# Patient Record
Sex: Female | Born: 1935 | Race: White | Hispanic: No | Smoking: Former smoker
Health system: Southern US, Community
[De-identification: ages and names within clinical notes are randomized; demographics above are authoritative.]

## PROBLEM LIST (undated history)

## (undated) DIAGNOSIS — I10 Essential (primary) hypertension: Secondary | ICD-10-CM

## (undated) DIAGNOSIS — C4490 Unspecified malignant neoplasm of skin, unspecified: Secondary | ICD-10-CM

## (undated) DIAGNOSIS — J449 Chronic obstructive pulmonary disease, unspecified: Secondary | ICD-10-CM

## (undated) DIAGNOSIS — E042 Nontoxic multinodular goiter: Secondary | ICD-10-CM

## (undated) DIAGNOSIS — I4891 Unspecified atrial fibrillation: Secondary | ICD-10-CM

## (undated) DIAGNOSIS — M4850XA Collapsed vertebra, not elsewhere classified, site unspecified, initial encounter for fracture: Secondary | ICD-10-CM

## (undated) DIAGNOSIS — Z860100 Personal history of colon polyps, unspecified: Secondary | ICD-10-CM

## (undated) DIAGNOSIS — K31819 Angiodysplasia of stomach and duodenum without bleeding: Secondary | ICD-10-CM

## (undated) DIAGNOSIS — I2510 Atherosclerotic heart disease of native coronary artery without angina pectoris: Secondary | ICD-10-CM

## (undated) DIAGNOSIS — A4902 Methicillin resistant Staphylococcus aureus infection, unspecified site: Secondary | ICD-10-CM

## (undated) DIAGNOSIS — E7800 Pure hypercholesterolemia, unspecified: Secondary | ICD-10-CM

## (undated) DIAGNOSIS — Z8719 Personal history of other diseases of the digestive system: Secondary | ICD-10-CM

## (undated) DIAGNOSIS — K8020 Calculus of gallbladder without cholecystitis without obstruction: Secondary | ICD-10-CM

## (undated) DIAGNOSIS — R252 Cramp and spasm: Secondary | ICD-10-CM

## (undated) DIAGNOSIS — K625 Hemorrhage of anus and rectum: Secondary | ICD-10-CM

## (undated) DIAGNOSIS — C189 Malignant neoplasm of colon, unspecified: Secondary | ICD-10-CM

## (undated) DIAGNOSIS — I739 Peripheral vascular disease, unspecified: Secondary | ICD-10-CM

## (undated) DIAGNOSIS — K5730 Diverticulosis of large intestine without perforation or abscess without bleeding: Secondary | ICD-10-CM

## (undated) DIAGNOSIS — N912 Amenorrhea, unspecified: Secondary | ICD-10-CM

## (undated) DIAGNOSIS — K56609 Unspecified intestinal obstruction, unspecified as to partial versus complete obstruction: Secondary | ICD-10-CM

## (undated) DIAGNOSIS — S72009A Fracture of unspecified part of neck of unspecified femur, initial encounter for closed fracture: Secondary | ICD-10-CM

## (undated) DIAGNOSIS — E049 Nontoxic goiter, unspecified: Secondary | ICD-10-CM

## (undated) HISTORY — DX: Chronic obstructive pulmonary disease, unspecified: J44.9

## (undated) HISTORY — DX: Nontoxic goiter, unspecified: E04.9

## (undated) HISTORY — DX: Cramp and spasm: R25.2

## (undated) HISTORY — DX: Personal history of colon polyps, unspecified: Z86.0100

## (undated) HISTORY — DX: Unspecified atrial fibrillation: I48.91

## (undated) HISTORY — PX: CARDIAC CATHETERIZATION: SHX172

## (undated) HISTORY — DX: Unspecified intestinal obstruction, unspecified as to partial versus complete obstruction: K56.609

## (undated) HISTORY — PX: TOTAL ABDOMINAL HYSTERECTOMY: SHX209

## (undated) HISTORY — DX: Calculus of gallbladder without cholecystitis without obstruction: K80.20

## (undated) HISTORY — DX: Personal history of other diseases of the digestive system: Z87.19

## (undated) HISTORY — DX: Nontoxic multinodular goiter: E04.2

## (undated) HISTORY — DX: Pure hypercholesterolemia, unspecified: E78.00

## (undated) HISTORY — DX: Methicillin resistant Staphylococcus aureus infection, unspecified site: A49.02

## (undated) HISTORY — PX: ABDOMINAL SURGERY: SHX537

## (undated) HISTORY — DX: Hemorrhage of anus and rectum: K62.5

## (undated) HISTORY — PX: HIP FRACTURE SURGERY: SHX118

## (undated) HISTORY — DX: Amenorrhea, unspecified: N91.2

## (undated) HISTORY — DX: Collapsed vertebra, not elsewhere classified, site unspecified, initial encounter for fracture: M48.50XA

## (undated) HISTORY — DX: Angiodysplasia of stomach and duodenum without bleeding: K31.819

## (undated) HISTORY — DX: Diverticulosis of large intestine without perforation or abscess without bleeding: K57.30

## (undated) HISTORY — DX: Malignant neoplasm of colon, unspecified: C18.9

## (undated) HISTORY — PX: ABDOMINAL HYSTERECTOMY: SHX81

## (undated) HISTORY — DX: Personal history of colonic polyps: Z86.010

## (undated) HISTORY — DX: Unspecified malignant neoplasm of skin, unspecified: C44.90

## (undated) HISTORY — DX: Fracture of unspecified part of neck of unspecified femur, initial encounter for closed fracture: S72.009A

## (undated) HISTORY — DX: Peripheral vascular disease, unspecified: I73.9

---

## 1996-08-27 ENCOUNTER — Encounter (INDEPENDENT_AMBULATORY_CARE_PROVIDER_SITE_OTHER): Payer: Self-pay | Admitting: Internal Medicine

## 1997-05-29 HISTORY — PX: COLON RESECTION: SHX5231

## 1997-05-29 HISTORY — PX: ILEOSTOMY: SHX1783

## 1997-09-02 ENCOUNTER — Ambulatory Visit (HOSPITAL_COMMUNITY): Admission: RE | Admit: 1997-09-02 | Discharge: 1997-09-02 | Payer: Self-pay

## 1997-10-27 DIAGNOSIS — K8020 Calculus of gallbladder without cholecystitis without obstruction: Secondary | ICD-10-CM

## 1997-10-27 HISTORY — DX: Calculus of gallbladder without cholecystitis without obstruction: K80.20

## 1997-12-10 ENCOUNTER — Inpatient Hospital Stay (HOSPITAL_COMMUNITY): Admission: RE | Admit: 1997-12-10 | Discharge: 1997-12-22 | Payer: Self-pay

## 1998-03-25 ENCOUNTER — Ambulatory Visit (HOSPITAL_COMMUNITY): Admission: RE | Admit: 1998-03-25 | Discharge: 1998-03-25 | Payer: Self-pay | Admitting: Gastroenterology

## 1998-11-27 HISTORY — PX: ANGIOPLASTY: SHX39

## 1999-03-23 ENCOUNTER — Observation Stay: Admission: RE | Admit: 1999-03-23 | Discharge: 1999-03-24 | Payer: Self-pay | Admitting: *Deleted

## 1999-03-30 LAB — FECAL OCCULT BLOOD, GUAIAC: Fecal Occult Blood: NEGATIVE

## 1999-06-29 ENCOUNTER — Encounter: Admission: RE | Admit: 1999-06-29 | Discharge: 1999-06-29 | Payer: Self-pay

## 2000-01-11 ENCOUNTER — Encounter: Payer: Self-pay | Admitting: Vascular Surgery

## 2000-01-12 ENCOUNTER — Observation Stay (HOSPITAL_COMMUNITY): Admission: RE | Admit: 2000-01-12 | Discharge: 2000-01-13 | Payer: Self-pay | Admitting: Vascular Surgery

## 2000-06-25 ENCOUNTER — Encounter: Admission: RE | Admit: 2000-06-25 | Discharge: 2000-06-25 | Payer: Self-pay

## 2000-06-28 ENCOUNTER — Inpatient Hospital Stay (HOSPITAL_COMMUNITY): Admission: EM | Admit: 2000-06-28 | Discharge: 2000-07-04 | Payer: Self-pay | Admitting: Emergency Medicine

## 2000-06-29 ENCOUNTER — Encounter: Payer: Self-pay | Admitting: General Surgery

## 2000-07-03 ENCOUNTER — Encounter: Payer: Self-pay | Admitting: General Surgery

## 2000-07-04 ENCOUNTER — Encounter (HOSPITAL_COMMUNITY): Admission: RE | Admit: 2000-07-04 | Discharge: 2000-10-02 | Payer: Self-pay | Admitting: General Surgery

## 2001-01-19 ENCOUNTER — Inpatient Hospital Stay (HOSPITAL_COMMUNITY): Admission: EM | Admit: 2001-01-19 | Discharge: 2001-01-21 | Payer: Self-pay | Admitting: Internal Medicine

## 2001-01-19 ENCOUNTER — Encounter: Payer: Self-pay | Admitting: Internal Medicine

## 2002-05-29 HISTORY — PX: CATARACT EXTRACTION: SUR2

## 2002-07-13 ENCOUNTER — Encounter: Payer: Self-pay | Admitting: Emergency Medicine

## 2002-07-13 ENCOUNTER — Emergency Department (HOSPITAL_COMMUNITY): Admission: EM | Admit: 2002-07-13 | Discharge: 2002-07-13 | Payer: Self-pay | Admitting: Emergency Medicine

## 2002-10-19 ENCOUNTER — Inpatient Hospital Stay (HOSPITAL_COMMUNITY): Admission: EM | Admit: 2002-10-19 | Discharge: 2002-10-21 | Payer: Self-pay | Admitting: *Deleted

## 2003-05-17 ENCOUNTER — Inpatient Hospital Stay (HOSPITAL_COMMUNITY): Admission: EM | Admit: 2003-05-17 | Discharge: 2003-05-22 | Payer: Self-pay | Admitting: *Deleted

## 2004-04-07 ENCOUNTER — Ambulatory Visit: Payer: Self-pay | Admitting: Family Medicine

## 2004-05-31 ENCOUNTER — Ambulatory Visit: Payer: Self-pay | Admitting: Family Medicine

## 2004-06-12 ENCOUNTER — Emergency Department (HOSPITAL_COMMUNITY): Admission: EM | Admit: 2004-06-12 | Discharge: 2004-06-12 | Payer: Self-pay | Admitting: Emergency Medicine

## 2004-06-17 ENCOUNTER — Ambulatory Visit: Payer: Self-pay | Admitting: Family Medicine

## 2004-08-02 ENCOUNTER — Ambulatory Visit: Payer: Self-pay | Admitting: Family Medicine

## 2004-08-12 ENCOUNTER — Ambulatory Visit: Payer: Self-pay | Admitting: Family Medicine

## 2004-08-15 ENCOUNTER — Ambulatory Visit: Payer: Self-pay | Admitting: Family Medicine

## 2004-08-16 ENCOUNTER — Ambulatory Visit: Payer: Self-pay

## 2004-09-01 ENCOUNTER — Ambulatory Visit: Payer: Self-pay | Admitting: Family Medicine

## 2004-10-10 ENCOUNTER — Ambulatory Visit: Payer: Self-pay | Admitting: Gastroenterology

## 2004-11-03 ENCOUNTER — Ambulatory Visit: Payer: Self-pay | Admitting: Gastroenterology

## 2005-01-31 ENCOUNTER — Ambulatory Visit: Payer: Self-pay | Admitting: Family Medicine

## 2005-02-03 ENCOUNTER — Ambulatory Visit: Payer: Self-pay | Admitting: Family Medicine

## 2005-02-09 ENCOUNTER — Ambulatory Visit: Payer: Self-pay | Admitting: Family Medicine

## 2005-02-10 ENCOUNTER — Ambulatory Visit: Payer: Self-pay

## 2005-02-22 ENCOUNTER — Ambulatory Visit: Payer: Self-pay | Admitting: Family Medicine

## 2005-03-08 ENCOUNTER — Ambulatory Visit: Payer: Self-pay | Admitting: Family Medicine

## 2005-03-20 ENCOUNTER — Ambulatory Visit: Payer: Self-pay | Admitting: Family Medicine

## 2005-04-13 ENCOUNTER — Ambulatory Visit: Payer: Self-pay | Admitting: Internal Medicine

## 2005-04-25 ENCOUNTER — Ambulatory Visit: Payer: Self-pay | Admitting: Family Medicine

## 2005-05-09 ENCOUNTER — Ambulatory Visit: Payer: Self-pay | Admitting: Internal Medicine

## 2005-05-30 ENCOUNTER — Ambulatory Visit: Payer: Self-pay | Admitting: Family Medicine

## 2005-07-27 ENCOUNTER — Ambulatory Visit: Payer: Self-pay

## 2005-08-07 ENCOUNTER — Ambulatory Visit: Payer: Self-pay | Admitting: Family Medicine

## 2005-08-10 ENCOUNTER — Ambulatory Visit: Payer: Self-pay | Admitting: Family Medicine

## 2005-08-21 ENCOUNTER — Ambulatory Visit: Payer: Self-pay | Admitting: Family Medicine

## 2005-09-06 ENCOUNTER — Emergency Department (HOSPITAL_COMMUNITY): Admission: EM | Admit: 2005-09-06 | Discharge: 2005-09-07 | Payer: Self-pay | Admitting: Emergency Medicine

## 2005-09-06 ENCOUNTER — Ambulatory Visit: Payer: Self-pay | Admitting: Family Medicine

## 2005-09-11 ENCOUNTER — Ambulatory Visit: Payer: Self-pay | Admitting: Internal Medicine

## 2006-02-12 ENCOUNTER — Ambulatory Visit: Payer: Self-pay | Admitting: Internal Medicine

## 2006-03-14 ENCOUNTER — Ambulatory Visit: Payer: Self-pay | Admitting: Family Medicine

## 2006-04-27 ENCOUNTER — Ambulatory Visit: Payer: Self-pay | Admitting: Family Medicine

## 2006-05-29 DIAGNOSIS — J449 Chronic obstructive pulmonary disease, unspecified: Secondary | ICD-10-CM

## 2006-05-29 HISTORY — DX: Chronic obstructive pulmonary disease, unspecified: J44.9

## 2006-06-12 ENCOUNTER — Ambulatory Visit: Payer: Self-pay | Admitting: Family Medicine

## 2006-06-19 ENCOUNTER — Ambulatory Visit: Payer: Self-pay | Admitting: Family Medicine

## 2006-07-13 ENCOUNTER — Ambulatory Visit: Payer: Self-pay | Admitting: Vascular Surgery

## 2006-08-03 ENCOUNTER — Observation Stay (HOSPITAL_COMMUNITY): Admission: RE | Admit: 2006-08-03 | Discharge: 2006-08-04 | Payer: Self-pay | Admitting: Orthopedic Surgery

## 2006-08-29 ENCOUNTER — Ambulatory Visit: Payer: Self-pay | Admitting: Gastroenterology

## 2006-08-29 LAB — CONVERTED CEMR LAB
Basophils Relative: 0.3 % (ref 0.0–1.0)
Eosinophils Relative: 2.2 % (ref 0.0–5.0)
Folate: >20 ng/mL
HCT: 36.6 % (ref 36.0–46.0)
Iron: 84 ug/dL (ref 42–145)
Neutrophils Relative %: 70.3 % (ref 43.0–77.0)
Platelets: 137 10*3/uL — ABNORMAL LOW (ref 150–400)
RBC: 3.61 M/uL — ABNORMAL LOW (ref 3.87–5.11)
RDW: 12.7 % (ref 11.5–14.6)
Saturation Ratios: 29.1 % (ref 20.0–50.0)
Transferrin: 206.3 mg/dL — ABNORMAL LOW (ref >=212.0)
WBC: 7.1 10*3/uL (ref 4.5–10.5)

## 2006-09-11 ENCOUNTER — Ambulatory Visit: Payer: Self-pay | Admitting: Gastroenterology

## 2006-09-11 LAB — CONVERTED CEMR LAB
OCCULT 1: POSITIVE — AB
OCCULT 2: POSITIVE — AB
OCCULT 5: NEGATIVE

## 2006-11-01 ENCOUNTER — Ambulatory Visit: Payer: Self-pay | Admitting: Internal Medicine

## 2006-11-07 ENCOUNTER — Ambulatory Visit: Payer: Self-pay

## 2006-11-07 ENCOUNTER — Ambulatory Visit: Payer: Self-pay | Admitting: Internal Medicine

## 2006-11-15 ENCOUNTER — Ambulatory Visit: Payer: Self-pay | Admitting: Internal Medicine

## 2006-12-08 ENCOUNTER — Emergency Department (HOSPITAL_COMMUNITY): Admission: EM | Admit: 2006-12-08 | Discharge: 2006-12-08 | Payer: Self-pay | Admitting: Family Medicine

## 2006-12-12 ENCOUNTER — Ambulatory Visit: Payer: Self-pay | Admitting: Family Medicine

## 2006-12-12 DIAGNOSIS — J449 Chronic obstructive pulmonary disease, unspecified: Secondary | ICD-10-CM

## 2006-12-12 DIAGNOSIS — J4489 Other specified chronic obstructive pulmonary disease: Secondary | ICD-10-CM | POA: Insufficient documentation

## 2006-12-17 ENCOUNTER — Ambulatory Visit: Payer: Self-pay | Admitting: Family Medicine

## 2006-12-18 ENCOUNTER — Encounter (INDEPENDENT_AMBULATORY_CARE_PROVIDER_SITE_OTHER): Payer: Self-pay | Admitting: Internal Medicine

## 2006-12-18 DIAGNOSIS — Z860100 Personal history of colon polyps, unspecified: Secondary | ICD-10-CM | POA: Insufficient documentation

## 2006-12-18 DIAGNOSIS — R000 Tachycardia, unspecified: Secondary | ICD-10-CM

## 2006-12-18 DIAGNOSIS — K2960 Other gastritis without bleeding: Secondary | ICD-10-CM

## 2006-12-18 DIAGNOSIS — I7090 Unspecified atherosclerosis: Secondary | ICD-10-CM | POA: Insufficient documentation

## 2006-12-18 DIAGNOSIS — I739 Peripheral vascular disease, unspecified: Secondary | ICD-10-CM | POA: Insufficient documentation

## 2006-12-18 DIAGNOSIS — I498 Other specified cardiac arrhythmias: Secondary | ICD-10-CM

## 2006-12-18 DIAGNOSIS — Z87891 Personal history of nicotine dependence: Secondary | ICD-10-CM | POA: Insufficient documentation

## 2006-12-21 ENCOUNTER — Ambulatory Visit: Payer: Self-pay | Admitting: Family Medicine

## 2006-12-25 ENCOUNTER — Telehealth (INDEPENDENT_AMBULATORY_CARE_PROVIDER_SITE_OTHER): Payer: Self-pay | Admitting: Internal Medicine

## 2007-01-04 ENCOUNTER — Ambulatory Visit: Payer: Self-pay | Admitting: Vascular Surgery

## 2007-01-14 ENCOUNTER — Ambulatory Visit: Payer: Self-pay | Admitting: Internal Medicine

## 2007-01-14 LAB — CONVERTED CEMR LAB
ALT: 41 units/L — ABNORMAL HIGH (ref 0–35)
Bilirubin, Direct: 0.2 mg/dL (ref 0.0–0.3)
Cholesterol: 157 mg/dL (ref 0–200)
HDL: 43.5 mg/dL (ref >39.0)
LDL Cholesterol: 93 mg/dL (ref 0–99)
Total CHOL/HDL Ratio: 3.6
Total Protein: 6.2 g/dL (ref 6.0–8.3)
Triglycerides: 102 mg/dL (ref 0–149)

## 2007-02-21 ENCOUNTER — Ambulatory Visit: Payer: Self-pay | Admitting: Internal Medicine

## 2007-02-21 DIAGNOSIS — J069 Acute upper respiratory infection, unspecified: Secondary | ICD-10-CM | POA: Insufficient documentation

## 2007-03-04 ENCOUNTER — Ambulatory Visit: Payer: Self-pay | Admitting: Internal Medicine

## 2007-03-15 ENCOUNTER — Ambulatory Visit: Payer: Self-pay | Admitting: Family Medicine

## 2007-03-25 ENCOUNTER — Ambulatory Visit: Payer: Self-pay | Admitting: Internal Medicine

## 2007-03-25 LAB — CONVERTED CEMR LAB
BUN: 14 mg/dL (ref 6–23)
Basophils Relative: 0.7 % (ref 0.0–1.0)
CO2: 34 meq/L — ABNORMAL HIGH (ref 19–32)
Calcium: 9.5 mg/dL (ref 8.4–10.5)
Creatinine, Ser: 0.9 mg/dL (ref 0.4–1.2)
Eosinophils Relative: 3.6 % (ref 0.0–5.0)
Hemoglobin: 14.1 g/dL (ref 12.0–15.0)
Monocytes Absolute: 0.7 10*3/uL (ref 0.2–0.7)
Monocytes Relative: 12.5 % — ABNORMAL HIGH (ref 3.0–11.0)
Potassium: 4.3 meq/L (ref 3.5–5.1)
RDW: 12.7 % (ref 11.5–14.6)
TSH: 1.17 microintl units/mL (ref 0.35–5.50)
WBC: 5.4 10*3/uL (ref 4.5–10.5)

## 2007-04-23 ENCOUNTER — Ambulatory Visit: Payer: Self-pay | Admitting: Internal Medicine

## 2007-04-24 ENCOUNTER — Encounter: Payer: Self-pay | Admitting: Family Medicine

## 2007-04-30 ENCOUNTER — Ambulatory Visit: Payer: Self-pay | Admitting: Family Medicine

## 2007-05-03 ENCOUNTER — Telehealth (INDEPENDENT_AMBULATORY_CARE_PROVIDER_SITE_OTHER): Payer: Self-pay | Admitting: *Deleted

## 2007-05-20 ENCOUNTER — Ambulatory Visit: Payer: Self-pay | Admitting: Internal Medicine

## 2007-05-27 ENCOUNTER — Ambulatory Visit: Payer: Self-pay | Admitting: Family Medicine

## 2007-06-18 ENCOUNTER — Encounter: Payer: Self-pay | Admitting: Family Medicine

## 2007-06-24 ENCOUNTER — Ambulatory Visit: Payer: Self-pay | Admitting: Internal Medicine

## 2007-07-09 ENCOUNTER — Ambulatory Visit: Payer: Self-pay | Admitting: Internal Medicine

## 2007-07-12 ENCOUNTER — Encounter: Payer: Self-pay | Admitting: Internal Medicine

## 2007-07-16 ENCOUNTER — Ambulatory Visit: Payer: Self-pay | Admitting: Internal Medicine

## 2007-07-16 LAB — CONVERTED CEMR LAB
Cholesterol: 192 mg/dL (ref 0–200)
Total CHOL/HDL Ratio: 3.4
Triglycerides: 99 mg/dL (ref 0–149)

## 2007-08-08 ENCOUNTER — Ambulatory Visit: Payer: Self-pay | Admitting: Internal Medicine

## 2007-09-09 ENCOUNTER — Ambulatory Visit: Payer: Self-pay | Admitting: Internal Medicine

## 2007-09-16 ENCOUNTER — Ambulatory Visit: Payer: Self-pay | Admitting: Internal Medicine

## 2007-11-05 ENCOUNTER — Encounter: Payer: Self-pay | Admitting: Family Medicine

## 2007-11-13 ENCOUNTER — Ambulatory Visit: Payer: Self-pay

## 2007-11-13 ENCOUNTER — Ambulatory Visit: Payer: Self-pay | Admitting: Family Medicine

## 2007-11-13 LAB — CONVERTED CEMR LAB
Albumin: 3.8 g/dL (ref 3.5–5.2)
Cholesterol: 208 mg/dL (ref 0–200)
Direct LDL: 128.4 mg/dL
HDL: 62.9 mg/dL (ref >39.0)
Total CHOL/HDL Ratio: 3.3
Triglycerides: 82 mg/dL (ref 0–149)
VLDL: 16 mg/dL (ref 0–40)

## 2007-11-16 ENCOUNTER — Emergency Department (HOSPITAL_COMMUNITY): Admission: EM | Admit: 2007-11-16 | Discharge: 2007-11-16 | Payer: Self-pay | Admitting: Emergency Medicine

## 2007-11-25 ENCOUNTER — Encounter: Payer: Self-pay | Admitting: Family Medicine

## 2007-12-10 ENCOUNTER — Ambulatory Visit: Payer: Self-pay | Admitting: Family Medicine

## 2007-12-11 ENCOUNTER — Encounter: Payer: Self-pay | Admitting: Family Medicine

## 2007-12-11 LAB — CONVERTED CEMR LAB
Chloride: 102 meq/L (ref 96–112)
Creatinine, Ser: 1.1 mg/dL (ref 0.4–1.2)
Free T4: 0.9 ng/dL (ref 0.6–1.6)
GFR calc non Af Amer: 50 mL/min
Potassium: 4.1 meq/L (ref 3.5–5.1)

## 2007-12-12 ENCOUNTER — Encounter: Payer: Self-pay | Admitting: Family Medicine

## 2008-02-20 ENCOUNTER — Inpatient Hospital Stay (HOSPITAL_COMMUNITY): Admission: RE | Admit: 2008-02-20 | Discharge: 2008-02-21 | Payer: Self-pay | Admitting: Orthopaedic Surgery

## 2008-02-24 ENCOUNTER — Encounter: Payer: Self-pay | Admitting: Family Medicine

## 2008-02-25 ENCOUNTER — Ambulatory Visit: Payer: Self-pay | Admitting: Family Medicine

## 2008-03-04 ENCOUNTER — Ambulatory Visit: Payer: Self-pay | Admitting: Family Medicine

## 2008-03-05 ENCOUNTER — Telehealth: Payer: Self-pay | Admitting: Family Medicine

## 2008-03-23 ENCOUNTER — Encounter: Payer: Self-pay | Admitting: Family Medicine

## 2008-03-30 ENCOUNTER — Ambulatory Visit: Payer: Self-pay | Admitting: Internal Medicine

## 2008-04-10 ENCOUNTER — Encounter: Payer: Self-pay | Admitting: Family Medicine

## 2008-04-14 ENCOUNTER — Ambulatory Visit: Payer: Self-pay

## 2008-04-20 ENCOUNTER — Encounter: Admission: RE | Admit: 2008-04-20 | Discharge: 2008-04-20 | Payer: Self-pay | Admitting: Orthopaedic Surgery

## 2008-04-27 ENCOUNTER — Encounter: Payer: Self-pay | Admitting: Family Medicine

## 2008-05-11 ENCOUNTER — Ambulatory Visit: Payer: Self-pay | Admitting: Family Medicine

## 2008-05-27 DIAGNOSIS — E785 Hyperlipidemia, unspecified: Secondary | ICD-10-CM

## 2008-05-27 DIAGNOSIS — I4891 Unspecified atrial fibrillation: Secondary | ICD-10-CM

## 2008-05-27 DIAGNOSIS — I2510 Atherosclerotic heart disease of native coronary artery without angina pectoris: Secondary | ICD-10-CM | POA: Insufficient documentation

## 2008-05-27 DIAGNOSIS — I700 Atherosclerosis of aorta: Secondary | ICD-10-CM

## 2008-05-27 DIAGNOSIS — I10 Essential (primary) hypertension: Secondary | ICD-10-CM

## 2008-06-19 ENCOUNTER — Encounter: Payer: Self-pay | Admitting: Family Medicine

## 2008-07-01 ENCOUNTER — Ambulatory Visit: Payer: Self-pay | Admitting: Family Medicine

## 2008-07-06 ENCOUNTER — Telehealth: Payer: Self-pay | Admitting: Family Medicine

## 2008-07-19 ENCOUNTER — Emergency Department (HOSPITAL_COMMUNITY): Admission: EM | Admit: 2008-07-19 | Discharge: 2008-07-19 | Payer: Self-pay | Admitting: Emergency Medicine

## 2008-09-01 ENCOUNTER — Encounter: Payer: Self-pay | Admitting: Family Medicine

## 2008-09-07 ENCOUNTER — Encounter: Payer: Self-pay | Admitting: Family Medicine

## 2008-10-16 ENCOUNTER — Ambulatory Visit: Payer: Self-pay

## 2008-10-16 ENCOUNTER — Encounter: Payer: Self-pay | Admitting: Internal Medicine

## 2008-11-09 ENCOUNTER — Ambulatory Visit: Payer: Self-pay | Admitting: Internal Medicine

## 2008-11-25 ENCOUNTER — Encounter: Payer: Self-pay | Admitting: Family Medicine

## 2008-11-25 ENCOUNTER — Telehealth: Payer: Self-pay | Admitting: Family Medicine

## 2008-12-09 ENCOUNTER — Encounter: Payer: Self-pay | Admitting: Cardiovascular Disease

## 2008-12-15 ENCOUNTER — Ambulatory Visit: Payer: Self-pay | Admitting: Family Medicine

## 2008-12-24 ENCOUNTER — Telehealth: Payer: Self-pay | Admitting: Family Medicine

## 2009-02-24 ENCOUNTER — Emergency Department (HOSPITAL_COMMUNITY): Admission: EM | Admit: 2009-02-24 | Discharge: 2009-02-24 | Payer: Self-pay | Admitting: Emergency Medicine

## 2009-03-03 ENCOUNTER — Ambulatory Visit: Payer: Self-pay | Admitting: Family Medicine

## 2009-03-12 ENCOUNTER — Ambulatory Visit: Payer: Self-pay | Admitting: Family Medicine

## 2009-03-12 LAB — CONVERTED CEMR LAB: Rapid Strep: NEGATIVE

## 2009-06-29 ENCOUNTER — Ambulatory Visit: Payer: Self-pay | Admitting: Family Medicine

## 2009-06-30 LAB — CONVERTED CEMR LAB
ALT: 20 units/L (ref 0–35)
AST: 24 units/L (ref 0–37)
BUN: 15 mg/dL (ref 6–23)
Basophils Absolute: 0 10*3/uL (ref 0.0–0.1)
Basophils Relative: 0.4 % (ref 0.0–3.0)
CO2: 33 meq/L — ABNORMAL HIGH (ref 19–32)
Chloride: 105 meq/L (ref 96–112)
Cholesterol: 201 mg/dL — ABNORMAL HIGH (ref 0–200)
Direct LDL: 111.3 mg/dL
Eosinophils Relative: 1.4 % (ref 0.0–5.0)
GFR calc non Af Amer: 55.7 mL/min (ref >60)
HCT: 41.3 % (ref 36.0–46.0)
Hemoglobin: 13.6 g/dL (ref 12.0–15.0)
Lymphs Abs: 1.4 10*3/uL (ref 0.7–4.0)
Monocytes Relative: 9.1 % (ref 3.0–12.0)
Neutro Abs: 4.7 10*3/uL (ref 1.4–7.7)
RBC: 3.95 M/uL (ref 3.87–5.11)
RDW: 12.4 % (ref 11.5–14.6)
TSH: 0.58 microintl units/mL (ref 0.35–5.50)
Total CHOL/HDL Ratio: 3
Total Protein: 6.4 g/dL (ref 6.0–8.3)

## 2009-07-01 ENCOUNTER — Ambulatory Visit: Payer: Self-pay | Admitting: Family Medicine

## 2009-07-09 ENCOUNTER — Encounter: Payer: Self-pay | Admitting: Family Medicine

## 2009-07-09 ENCOUNTER — Ambulatory Visit: Payer: Self-pay | Admitting: Internal Medicine

## 2009-07-12 ENCOUNTER — Telehealth: Payer: Self-pay | Admitting: Internal Medicine

## 2009-07-14 ENCOUNTER — Encounter: Payer: Self-pay | Admitting: Internal Medicine

## 2009-07-30 ENCOUNTER — Telehealth: Payer: Self-pay | Admitting: Internal Medicine

## 2009-08-11 ENCOUNTER — Ambulatory Visit: Payer: Self-pay | Admitting: Family Medicine

## 2009-08-11 LAB — CONVERTED CEMR LAB
Bilirubin Urine: NEGATIVE
Specific Gravity, Urine: 1.01
Urobilinogen, UA: 0.2
pH: 5

## 2009-08-12 ENCOUNTER — Encounter: Payer: Self-pay | Admitting: Family Medicine

## 2009-08-23 ENCOUNTER — Telehealth: Payer: Self-pay | Admitting: Internal Medicine

## 2009-08-26 ENCOUNTER — Encounter: Payer: Self-pay | Admitting: Internal Medicine

## 2009-10-28 ENCOUNTER — Encounter: Payer: Self-pay | Admitting: Family Medicine

## 2009-10-28 ENCOUNTER — Encounter: Payer: Self-pay | Admitting: Internal Medicine

## 2009-10-30 ENCOUNTER — Emergency Department (HOSPITAL_COMMUNITY): Admission: EM | Admit: 2009-10-30 | Discharge: 2009-10-30 | Payer: Self-pay | Admitting: Family Medicine

## 2009-11-01 ENCOUNTER — Telehealth: Payer: Self-pay | Admitting: Family Medicine

## 2009-11-03 ENCOUNTER — Ambulatory Visit: Payer: Self-pay | Admitting: Family Medicine

## 2009-11-08 ENCOUNTER — Telehealth: Payer: Self-pay | Admitting: Internal Medicine

## 2009-11-10 ENCOUNTER — Encounter: Payer: Self-pay | Admitting: Family Medicine

## 2009-11-12 ENCOUNTER — Encounter (INDEPENDENT_AMBULATORY_CARE_PROVIDER_SITE_OTHER): Payer: Self-pay | Admitting: *Deleted

## 2009-11-15 ENCOUNTER — Ambulatory Visit: Payer: Self-pay | Admitting: Family Medicine

## 2010-01-04 ENCOUNTER — Encounter (INDEPENDENT_AMBULATORY_CARE_PROVIDER_SITE_OTHER): Payer: Self-pay | Admitting: *Deleted

## 2010-01-24 ENCOUNTER — Ambulatory Visit: Payer: Self-pay | Admitting: Internal Medicine

## 2010-01-24 DIAGNOSIS — I6529 Occlusion and stenosis of unspecified carotid artery: Secondary | ICD-10-CM

## 2010-01-28 ENCOUNTER — Encounter: Payer: Self-pay | Admitting: Internal Medicine

## 2010-02-03 ENCOUNTER — Ambulatory Visit: Payer: Self-pay

## 2010-02-03 ENCOUNTER — Encounter: Payer: Self-pay | Admitting: Internal Medicine

## 2010-02-08 ENCOUNTER — Telehealth: Payer: Self-pay | Admitting: Internal Medicine

## 2010-02-14 ENCOUNTER — Telehealth: Payer: Self-pay | Admitting: Internal Medicine

## 2010-02-17 ENCOUNTER — Ambulatory Visit: Payer: Self-pay | Admitting: Family Medicine

## 2010-04-14 ENCOUNTER — Ambulatory Visit: Payer: Self-pay | Admitting: Family Medicine

## 2010-04-20 ENCOUNTER — Telehealth: Payer: Self-pay | Admitting: Family Medicine

## 2010-05-24 ENCOUNTER — Ambulatory Visit
Admission: RE | Admit: 2010-05-24 | Discharge: 2010-05-24 | Payer: Self-pay | Source: Home / Self Care | Attending: Internal Medicine | Admitting: Internal Medicine

## 2010-05-24 ENCOUNTER — Ambulatory Visit
Admission: RE | Admit: 2010-05-24 | Discharge: 2010-05-24 | Payer: Self-pay | Source: Home / Self Care | Attending: Family Medicine | Admitting: Family Medicine

## 2010-05-25 ENCOUNTER — Telehealth: Payer: Self-pay | Admitting: Family Medicine

## 2010-05-28 ENCOUNTER — Observation Stay (HOSPITAL_COMMUNITY)
Admission: EM | Admit: 2010-05-28 | Discharge: 2010-06-02 | Payer: Self-pay | Source: Home / Self Care | Attending: Internal Medicine | Admitting: Internal Medicine

## 2010-06-08 ENCOUNTER — Encounter: Payer: Self-pay | Admitting: Family Medicine

## 2010-06-21 ENCOUNTER — Ambulatory Visit (HOSPITAL_COMMUNITY)
Admission: RE | Admit: 2010-06-21 | Discharge: 2010-06-21 | Payer: Self-pay | Source: Home / Self Care | Attending: Interventional Radiology | Admitting: Interventional Radiology

## 2010-06-22 ENCOUNTER — Ambulatory Visit
Admission: RE | Admit: 2010-06-22 | Discharge: 2010-06-22 | Payer: Self-pay | Source: Home / Self Care | Attending: Family Medicine | Admitting: Family Medicine

## 2010-06-28 NOTE — Progress Notes (Signed)
Summary: Patient's at Home Vitals  Patient's at Home Vitals   Imported By: Marylou Mccoy 07/15/2009 11:49:16  _____________________________________________________________________  External Attachment:    Type:   Image     Comment:   External Document  Appended Document: Patient's at Home Vitals BP is a little up/down.  I would not push further.  Keep on same meds.  Appended Document: Patient's at Home Vitals Patient aware of above.

## 2010-06-28 NOTE — Assessment & Plan Note (Signed)
Summary: ?UTI/CLE   Vital Signs:  Patient profile:   77 year old female Weight:      153 pounds Temp:     97.6 degrees F oral Pulse rate:   76 / minute Pulse rhythm:   regular BP sitting:   138 / 78  (left arm) Cuff size:   regular  Vitals Entered By: Sydell Axon LPN (August 11, 2009 11:25 AM) CC: ? UTI, problem emptying bladder, pressure and back pain   History of Present Illness: Pt here fotr burning with urination and lack of emptying with frequency and feeling of not emptying completely. She denies fever or chills. She typically has chills. She has mild LBP but not much.  Problems Prior to Update: 1)  Hair Loss  (ICD-704.00) 2)  Atrial Fibrillation  (ICD-427.31) 3)  Aortic Atherosclerosis  (ICD-440.0) 4)  Hypertension, Benign  (ICD-401.1) 5)  Hyperlipidemia-mixed  (ICD-272.4) 6)  Cad, Native Vessel  (ICD-414.01) 7)  Low Back Pain Syndrome  (ICD-724.2) 8)  Ankle Edema  (ICD-782.3) 9)  Preoperative Examination  (ICD-V72.84) 10)  Rectal Bleeding  (ICD-569.3) 11)  Hypertension  (ICD-401.9) 12)  Uri  (ICD-465.9) 13)  Goiter, Multinodular, Thyroid  (ICD-241.1) 14)  Gi Bleeding, Small Intest With Anemia  (ICD-578.9) 15)  Tachycardia  (ICD-785.0) 16)  Hypercholesterolemia, 239/ldl 159  (ICD-272.0) 17)  Arteriovenous Malformation, Duodenum  (ICD-747.60) 18)  Carcinoma, Colon, S/p Resection  (ICD-153.9) 19)  Tobacco Abuse, Hx of  (ICD-V15.82) 20)  Reflux Gastritis  (ICD-535.40) 21)  Atherosclerosis  (ICD-440.9) 22)  Leg Cramps With Mild Claudication  (ICD-729.82) 23)  Sinus Tachycardia  (ICD-427.89) 24)  Peripheral Vascular Disease  (ICD-443.9) 25)  Diverticulosis, Colon  (ICD-562.10) 26)  Colonic Polyps, Hx of  (ICD-V12.72) 27)  COPD  (ICD-496)  Medications Prior to Update: 1)  Adult Aspirin Low Strength 81 Mg  Tbdp (Aspirin) .... Take 1 Tablet By Mouth Once A Day 2)  Plavix 75 Mg Tabs (Clopidogrel Bisulfate) .... Take 1 Tablet By Mouth Once A Day 3)  Grape Seed .... 2  Tabs Once Daily 4)  Centrum Silver   Tabs (Multiple Vitamins-Minerals) .... Take 1 Tablet By Mouth Once A Day 5)  Hemocyte 324 Mg  Tabs (Ferrous Fumarate) .... Take 1 Tablet By Mouth Once A Day 6)  Prilosec 20 Mg  Cpdr (Omeprazole) .... Take 1 Capsule By Mouth Once A Day 7)  Nitroquick 0.4 Mg  Subl (Nitroglycerin) .Marland Kitchen.. 1 Under Tongue Every 5 Min X 3 As Needed 8)  Tylenol Extra Strength 500 Mg  Tabs (Acetaminophen) .... Per Bottle 9)  Advair Diskus 250-50 Mcg/dose  Misc (Fluticasone-Salmeterol) .... One Inh Two Times A Day 10)  Fish Oil 1000 Mg Caps (Omega-3 Fatty Acids) .Marland Kitchen.. 1 Daily By Mouth 11)  Vitamin B-12 500 Mcg Tabs (Cyanocobalamin) .... Take One Tablet By Mouth Daily 12)  Biotin 1000 Mcg Tabs (Biotin) .... Take One Tablet Once Daily 13)  Vitamin D 1000 Unit Tabs (Cholecalciferol) .... Take One Tablet Daily 14)  Cranberry 500 Mg Caps (Cranberry) .... Take One Tablet Daily 15)  Amlodipine Besylate 5 Mg Tabs (Amlodipine Besylate) .Marland Kitchen.. 1 Tablet Every Day  Allergies: 1)  ! * Symbicort 2)  * Sulfa (Sulfonamides) Group  Physical Exam  General:  Well developed, well nourished, in no acute distress. Head:  normocephalic, atraumatic, and no abnormalities observed.  Hair thin but robust. Eyes:  vision grossly intact, pupils equal, pupils round, and pupils reactive to light.   Ears:  External ear exam shows no  significant lesions or deformities.  Otoscopic examination reveals clear canals, tympanic membranes are intact bilaterally without bulging, retraction, inflammation or discharge. Hearing is grossly normal bilaterally. Nose:  mildly inflamed with clear thin discharge. Mouth:  pharynx pink and moist.   Neck:  supple with full rom and no masses or thyromegally, no JVD or carotid bruit  Lungs:  Normal respiratory effort, chest expands symmetrically. Lungs are clear to auscultation, no crackles or wheezes. Heart:  regular rate, rhythm. Normal S1, S2. No S3. No significant murmur Abdomen:  No  suprapubic tenderness. Msk:  No CVAT.   Impression & Recommendations:  Problem # 1:  DYSURIA (ICD-788.1) Assessment New  Urine looks ok. Will treat for dysuria with Pyridium and culture. Will use AB if indicated.  Her updated medication list for this problem includes:    Pyridium 200 Mg Tabs (Phenazopyridine hcl) ..... One tab by mouth three times a day  Orders: Prescription Created Electronically [phone]) UA Dipstick W/ Micro (manual) ([id number])  Encouraged to push clear liquids, get enough rest, and take acetaminophen as needed. To be seen in 10 days if no improvement, sooner if worse.  Complete Medication List: 1)  Adult Aspirin Low Strength 81 Mg Tbdp (Aspirin) .... Take 1 tablet by mouth once a day 2)  Plavix 75 Mg Tabs (Clopidogrel bisulfate) .... Take 1 tablet by mouth once a day 3)  Grape Seed  .... 2 tabs once daily 4)  Centrum Silver Tabs (Multiple vitamins-minerals) .... Take 1 tablet by mouth once a day 5)  Hemocyte 324 Mg Tabs (Ferrous fumarate) .... Take 1 tablet by mouth once a day 6)  Prilosec 20 Mg Cpdr (Omeprazole) .... Take 1 capsule by mouth once a day 7)  Nitroquick 0.4 Mg Subl (Nitroglycerin) .Marland Kitchen.. 1 under tongue every 5 min x 3 as needed 8)  Tylenol Extra Strength 500 Mg Tabs (Acetaminophen) .... Per bottle 9)  Advair Diskus 250-50 Mcg/dose Misc (Fluticasone-salmeterol) .... One inh two times a day 10)  Fish Oil 1000 Mg Caps (Omega-3 fatty acids) .Marland Kitchen.. 1 daily by mouth 11)  Vitamin B-12 500 Mcg Tabs (Cyanocobalamin) .... Take one tablet by mouth daily 12)  Biotin 1000 Mcg Tabs (Biotin) .... Take one tablet once daily 13)  Vitamin D 1000 Unit Tabs (Cholecalciferol) .... Take one tablet daily 14)  Cranberry 500 Mg Caps (Cranberry) .... Take one tablet daily 15)  Amlodipine Besylate 5 Mg Tabs (Amlodipine besylate) .Marland Kitchen.. 1 tablet every day 16)  Pyridium 200 Mg Tabs (Phenazopyridine hcl) .... One tab by mouth three times a day  Patient Instructions: 1)  Will call with  culture results. Prescriptions: PYRIDIUM 200 MG TABS (PHENAZOPYRIDINE HCL) one tab by mouth three times a day  #15 x 0   Entered and Authorized by:   Shaune Leeks MD   Signed by:   Shaune Leeks MD on 08/11/2009   Method used:   Electronically to        [facility]* (retail)       [address]       [place], [place]  [address]       Ph: [phone]       Fax: [phone]   RxID:   [id number]   Current Allergies (reviewed today): ! * SYMBICORT * SULFA (SULFONAMIDES) GROUP  Laboratory Results   Urine Tests  Date/Time Received: August 11, 2009 11:26 AM  Date/Time Reported: August 11, 2009 11:26 AM   Routine Urinalysis   Color: yellow Appearance: Hazy Glucose: negative   (  Normal Range: Negative) Bilirubin: negative   (Normal Range: Negative) Ketone: negative   (Normal Range: Negative) Spec. Gravity: 1.010   (Normal Range: 1.003-1.035) Blood: trace-intact   (Normal Range: Negative) pH: 5.0   (Normal Range: 5.0-8.0) Protein: trace   (Normal Range: Negative) Urobilinogen: 0.2   (Normal Range: 0-1) Nitrite: negative   (Normal Range: Negative) Leukocyte Esterace: trace   (Normal Range: Negative)        Appended Document: ?UTI/CLE U/A Micro esssentially nml.  Appended Document: ?UTI/CLE

## 2010-06-28 NOTE — Letter (Signed)
Summary: [facility] Home Oxygen Therapy Order  [facility] Oxygen Therapy Order   Imported By: Beau Fanny 11/04/2009 13:59:43  _____________________________________________________________________  External Attachment:    Type:   Image     Comment:   External Document

## 2010-06-28 NOTE — Progress Notes (Signed)
Summary: pt has bronchitis  Phone Note Call from Patient Call back at Home Phone [phone]   Caller: Patient Call For: Shaune Leeks MD Summary of Call: Pt states she was seen at urgent care across from [facility] on saturday night.  She was diagnosed with bronchitis and given z- pack and prednisone.  She wants follow up with you.  I advised her to finish her meds and then call back if not better. Initial call taken by: Lowella Petties CMA,  November 01, 2009 9:08 AM  Follow-up for Phone Call        Thank you. Follow-up by: Shaune Leeks MD,  November 01, 2009 9:42 AM

## 2010-06-28 NOTE — Assessment & Plan Note (Signed)
Summary: DISCUSS LAB RESULTS   Vital Signs:  Patient profile:   77 year old female Weight:      155 pounds Temp:     98.1 degrees F oral Pulse rate:   60 / minute Pulse rhythm:   regular BP sitting:   110 / 70  (left arm) Cuff size:   regular  Vitals Entered By: Sydell Axon LPN (July 01, 2009 11:22 AM) CC: Discuss lab results   History of Present Illness: Pt here at the suggestion of Dr Milinda Antis to discuss lawork drawn earlier. She feels well and only complaint is that her hair is thinning. She has not changed any medications and otherwwise feels well. She saw Dr Milinda Antis earlier in the week.  Problems Prior to Update: 1)  Hair Loss  (ICD-704.00) 2)  Atrial Fibrillation  (ICD-427.31) 3)  Aortic Atherosclerosis  (ICD-440.0) 4)  Hypertension, Benign  (ICD-401.1) 5)  Hyperlipidemia-mixed  (ICD-272.4) 6)  Cad, Native Vessel  (ICD-414.01) 7)  Low Back Pain Syndrome  (ICD-724.2) 8)  Ankle Edema  (ICD-782.3) 9)  Preoperative Examination  (ICD-V72.84) 10)  Rectal Bleeding  (ICD-569.3) 11)  Hypertension  (ICD-401.9) 12)  Uri  (ICD-465.9) 13)  Goiter, Multinodular, Thyroid  (ICD-241.1) 14)  Gi Bleeding, Small Intest With Anemia  (ICD-578.9) 15)  Tachycardia  (ICD-785.0) 16)  Hypercholesterolemia, 239/ldl 159  (ICD-272.0) 17)  Arteriovenous Malformation, Duodenum  (ICD-747.60) 18)  Carcinoma, Colon, S/p Resection  (ICD-153.9) 19)  Tobacco Abuse, Hx of  (ICD-V15.82) 20)  Reflux Gastritis  (ICD-535.40) 21)  Atherosclerosis  (ICD-440.9) 22)  Leg Cramps With Mild Claudication  (ICD-729.82) 23)  Sinus Tachycardia  (ICD-427.89) 24)  Peripheral Vascular Disease  (ICD-443.9) 25)  Diverticulosis, Colon  (ICD-562.10) 26)  Colonic Polyps, Hx of  (ICD-V12.72) 27)  COPD  (ICD-496)  Medications Prior to Update: 1)  Adult Aspirin Low Strength 81 Mg  Tbdp (Aspirin) .... Take 1 Tablet By Mouth Once A Day 2)  Plavix 75 Mg Tabs (Clopidogrel Bisulfate) .... Take 1 Tablet By Mouth Once A Day 3)   Grape Seed .... 2 Tabs Once Daily 4)  Centrum Silver   Tabs (Multiple Vitamins-Minerals) .... Take 1 Tablet By Mouth Once A Day 5)  Hemocyte 324 Mg  Tabs (Ferrous Fumarate) .... Take 1 Tablet By Mouth Once A Day 6)  Prilosec 20 Mg  Cpdr (Omeprazole) .... Take 1 Capsule By Mouth Once A Day 7)  Atenolol 25 Mg Tabs (Atenolol) .... Take 1 Tablet By Mouth Once A Day 8)  Nitroquick 0.4 Mg  Subl (Nitroglycerin) .Marland Kitchen.. 1 Under Tongue Every 5 Min X 3 As Needed 9)  Tylenol Extra Strength 500 Mg  Tabs (Acetaminophen) .... Per Bottle 10)  Advair Diskus 250-50 Mcg/dose  Misc (Fluticasone-Salmeterol) .... One Inh Two Times A Day 11)  Fish Oil 1000 Mg Caps (Omega-3 Fatty Acids) .Marland Kitchen.. 1 Daily By Mouth 12)  Vitamin B-12 500 Mcg Tabs (Cyanocobalamin) .... Take One Tablet By Mouth Daily 13)  Biotin 1000 Mcg Tabs (Biotin) .... Take One Tablet Once Daily 14)  Vitamin D 1000 Unit Tabs (Cholecalciferol) .... Take One Tablet Daily 15)  Cranberry 500 Mg Caps (Cranberry) .... Take One Tablet Daily  Allergies: 1)  ! * Symbicort 2)  * Sulfa (Sulfonamides) Group  Physical Exam  General:  Well developed, well nourished, in no acute distress. Head:  normocephalic, atraumatic, and no abnormalities observed.  Hair thin but robust. Eyes:  vision grossly intact, pupils equal, pupils round, and pupils reactive to light.  Ears:  External ear exam shows no significant lesions or deformities.  Otoscopic examination reveals clear canals, tympanic membranes are intact bilaterally without bulging, retraction, inflammation or discharge. Hearing is grossly normal bilaterally. Nose:  mildly inflamed with clear thin discharge. Mouth:  pharynx pink and moist.   Skin:  very mild hair thinning post crown no focal allopecia hair appears healthy- no breaking off  skin is nl  some ecchymosis due to blood thinners   Impression & Recommendations:  Problem # 1:  HAIR LOSS (ICD-704.00) Assessment Unchanged Labs essentially nml. Plt  count chronically mildly decreased. No focal abnmlty found. Declines referral to Derm.  Complete Medication List: 1)  Adult Aspirin Low Strength 81 Mg Tbdp (Aspirin) .... Take 1 tablet by mouth once a day 2)  Plavix 75 Mg Tabs (Clopidogrel bisulfate) .... Take 1 tablet by mouth once a day 3)  Grape Seed  .... 2 tabs once daily 4)  Centrum Silver Tabs (Multiple vitamins-minerals) .... Take 1 tablet by mouth once a day 5)  Hemocyte 324 Mg Tabs (Ferrous fumarate) .... Take 1 tablet by mouth once a day 6)  Prilosec 20 Mg Cpdr (Omeprazole) .... Take 1 capsule by mouth once a day 7)  Atenolol 25 Mg Tabs (Atenolol) .... Take 1 tablet by mouth once a day 8)  Nitroquick 0.4 Mg Subl (Nitroglycerin) .Marland Kitchen.. 1 under tongue every 5 min x 3 as needed 9)  Tylenol Extra Strength 500 Mg Tabs (Acetaminophen) .... Per bottle 10)  Advair Diskus 250-50 Mcg/dose Misc (Fluticasone-salmeterol) .... One inh two times a day 11)  Fish Oil 1000 Mg Caps (Omega-3 fatty acids) .Marland Kitchen.. 1 daily by mouth 12)  Vitamin B-12 500 Mcg Tabs (Cyanocobalamin) .... Take one tablet by mouth daily 13)  Biotin 1000 Mcg Tabs (Biotin) .... Take one tablet once daily 14)  Vitamin D 1000 Unit Tabs (Cholecalciferol) .... Take one tablet daily 15)  Cranberry 500 Mg Caps (Cranberry) .... Take one tablet daily  Current Allergies (reviewed today): ! * SYMBICORT * SULFA (SULFONAMIDES) GROUP

## 2010-06-28 NOTE — Letter (Signed)
Summary: Appointment - Reminder 2  [facility], Main Office  [address]   [place], [place] [address]   Phone: [phone]  Fax: [phone]     November 12, 2009 MRN: [id number]   [facility] [address] [place], [place]  [address]   Dear Ms. MAND,  Our records indicate that it is time to schedule a follow-up appointment with Dr. Tenny Craw in August. It is very important that we reach you to schedule this appointment. We look forward to participating in your health care needs. Please contact us at the number listed above at your earliest convenience to schedule your appointment.  If you are unable to make an appointment at this time, give [place] a call so we can update our records.     Sincerely,    [facility] Scheduling Team

## 2010-06-28 NOTE — Progress Notes (Signed)
Summary: calling regarding the pt oxygen   Phone Note Other Incoming   Caller: Fredonia Highland /[phone] [facility] Summary of Call: [facility] calling about the pt oxygen. Initial call taken by: Judie Grieve,  July 30, 2009 10:16 AM  Follow-up for Phone Call        Recent clinic visit, sats went to mid 80s with walking.  Needs oxygen. Follow-up by: Sherrill Raring, MD, [facility],  August 01, 2009 6:35 AM     Appended Document: calling regarding the pt oxygen Called Kathy back...advised still needs O2 therapy. Faxed last office note to her at [phone].

## 2010-06-28 NOTE — Progress Notes (Signed)
Summary: PT HAVE QUESTION ABOPUT MEDICATIO--/LM   Phone Note Call from Patient Call back at Home Phone [phone]   Caller: Patient Summary of Call: PT CALLING REGRADING MEDICATION FOR B/P Initial call taken by: Judie Grieve,  August 23, 2009 8:08 AM  Follow-up for Phone Call        [facility] for pt to call back Dennis Bast, RN, BSN  August 23, 2009 9:11 AM  Would like a call back as sooon as possible.  New meds are causing pain in joints.   Follow-up by: Burnard Leigh,  August 23, 2009 11:50 AM  Additional Follow-up for Phone Call Additional follow up Details #1::        pt unable to tolerate Amlodipine 2.5mg  daily due to cramping in fingers and all joints will ask Dr Tenny Craw what she wants to try next for her BP.  Pt knows Dr Tenny Craw not in office today and will call her back later after we discuss with Dr Jasmine Pang, RN, BSN  August 23, 2009 12:01 PM    Additional Follow-up for Phone Call Additional follow up Details #2::    Patient seen in clinic not long ago.  Wanted to come off atenolol because of hair loss.  I put her on Norvasc. I would go back on it becuase, except for hair loss, which I am not convinced is related to atenolol.  Had tolerated it otherwise.  Follow heart rate and BP. Follow-up by: Sherrill Raring, MD, [facility],  August 24, 2009 11:38 PM   Appended Document: PT HAVE QUESTION ABOPUT MEDICATIO--/LM Called patient...she is willing to go back on Atenolol 25mg  every day. She will have BP check at [facility] in a few weeks.

## 2010-06-28 NOTE — Assessment & Plan Note (Signed)
Summary: 6 month rov.sl      Allergies Added:   Visit Type:  Follow-up Primary Provider:  Hetty Ely  CC:  no complaints.  History of Present Illness:  [name] is an 77 year old with a hsitory of CAD (cath 2002:  nonobstructive CAD; Cardiolite 2005 Normal), CV disease, PVOD, dyslipidemia, COPD and PAF.  I last saw her in February 2011.Marland Kitchen  Since senn she denies chest pain, no signifiant shortness of breath.    Current Medications (verified): 1)  Adult Aspirin Low Strength 81 Mg  Tbdp (Aspirin) .... Take 1 Tablet By Mouth Once A Day 2)  Plavix 75 Mg Tabs (Clopidogrel Bisulfate) .... Take 1 Tablet By Mouth Once A Day 3)  Grape Seed .... 2 Tabs Once Daily 4)  Centrum Silver   Tabs (Multiple Vitamins-Minerals) .... Take 1 Tablet By Mouth Once A Day 5)  Hemocyte 324 Mg  Tabs (Ferrous Fumarate) .... Take 1 Tablet By Mouth Once A Day 6)  Prilosec 20 Mg  Cpdr (Omeprazole) .... Take 1 Capsule By Mouth Once A Day 7)  Nitroquick 0.4 Mg  Subl (Nitroglycerin) .Marland Kitchen.. 1 Under Tongue Every 5 Min X 3 As Needed 8)  Tylenol Extra Strength 500 Mg  Tabs (Acetaminophen) .... Per Bottle 9)  Advair Diskus 250-50 Mcg/dose  Misc (Fluticasone-Salmeterol) .... One Inh Two Times A Day 10)  Fish Oil 1000 Mg Caps (Omega-3 Fatty Acids) .Marland Kitchen.. 1 Daily By Mouth 11)  Vitamin B-12 500 Mcg Tabs (Cyanocobalamin) .... Take One Tablet By Mouth Daily 12)  Biotin 1000 Mcg Tabs (Biotin) .... Take One Tablet Once Daily 13)  Vitamin D 1000 Unit Tabs (Cholecalciferol) .... Take One Tablet Daily 14)  Cranberry 500 Mg Caps (Cranberry) .... Take One Tablet Daily 15)  Atenolol 25 Mg Tabs (Atenolol) .Marland Kitchen.. 1 Tab Every Day 16)  Prednisone 20 Mg Tabs (Prednisone) .... Take 2 By Mouth Daily 17)  Ventolin Hfa 108 (90 Base) Mcg/act Aers (Albuterol Sulfate) .... One Puff By Mouth Every 2-4 Hours As Needed 18)  Oxygen 2.5 Liters .... Use 2.5 Liters Daily 19)  Doxycycline Hyclate 100 Mg Caps (Doxycycline Hyclate) .... One Tab By Mouth Two Times A  Day  Allergies (verified): 1)  ! * Symbicort 2)  * Sulfa (Sulfonamides) Group  Past History:  Past medical, surgical, family and social histories (including risk factors) reviewed, and no changes noted (except as noted below).  Past Medical History: Reviewed history from 05/27/2008 and no changes required. Coronary artery disease, s/p PTCA RCA (Dr. Orlena Sheldon) Peripheral Vascular Disease Atrial fibrillation Hypertension Anemia COPD (Emphysemaous with asthmatic component - FEV! 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) GOITER, MULTINODULAR, THYROID (ICD-241.1) Hx esophagitis, gastritis, duodenitis TACHYCARDIA (ICD-785.0) HYPERCHOLESTEROLEMIA, 239/LDL 159 (ICD-272.0) ARTERIOVENOUS MALFORMATION, DUODENUM (ICD-747.60) CARCINOMA, COLON, S/P RESECTION (ICD-153.9) TOBACCO ABUSE, HX OF (ICD-V15.82) REFLUX GASTRITIS (ICD-535.40) ATHEROSCLEROSIS (ICD-440.9) LEG CRAMPS WITH MILD CLAUDICATION (ICD-729.82) SINUS TACHYCARDIA (ICD-427.89) PERIPHERAL VASCULAR DISEASE (ICD-443.9) DIVERTICULOSIS, COLON (ICD-562.10) COLONIC POLYPS, HX OF (ICD-V12.72) COPD (ICD-496) R hip fracture  Past Surgical History: Reviewed history from 05/27/2008 and no changes required. Colon resection 01/99 (cancer and carcinoid tumor) Abd. U/S- Gallstones 06/99 Ileostomy 01/99---takedown 7/99 Cath, stent/ER ok LE's 10/00 PV aortography-stent 10/00 S/P fem-pop R CA angioplastied 07/00 Hosp.- incarcerated ventral hernia (abd. surg) MRSA 06/28/00 Stent--common iliacs bilat-0816/01 EGD neg 10/20/02 Colonoscopy wnl 10/21/02- hosp., EGD neg Thyroid U/S, goiter- bx 06/26/03 Pinning R hip Sept 2009 Bilateral cataract surg 2004 TAH L Fem neck Fx ORIF  08/03/06 PV W/U Minimal LE Dz (Dr Earnestine Leys) 11/05/07 R Fem neck Fx ORIF (Dr Jerl Santos) 02/20/08  Family History: Reviewed history from 12/18/2006 and no changes required. Father: Died at the age of 89 of cancer of the  stomach Mother: Died at the age of 34 of kidney failure Siblings: Three brothers and 2 sisters, all deceased, 1 from stroke, 1 from pneumonia with stroke, 1 with cancer of unknown type, and 1 sister with genital cancer  Social History: Reviewed history from 05/27/2008 and no changes required. Marital Status: Widowed Patient states former smoker. Quit 1998  Vital Signs:  Patient profile:   77 year old female Height:      63 inches Weight:      151 pounds BMI:     26.85 Pulse rate:   70 / minute BP sitting:   180 / 80  (left arm) Cuff size:   regular  Vitals Entered By: Burnett Kanaris, CNA (January 24, 2010 10:31 AM)  Physical Exam  Additional Exam:  patient is in NAD HEENT:  Normocephalic, atraumatic. EOMI, PERRLA.  Neck: JVP is normal. No thyromegaly.   Lungs: clear to auscultation. No rales no wheezes.  Heart: Regular rate and rhythm. Normal S1, S2. No S3.   No significant murmurs. PMI not displaced.  Abdomen:  Supple, nontender. Normal bowel sounds. No masses. No hepatomegaly.  Extremities:  No lower extremity edema.  Musculoskeletal :moving all extremities.  Neuro:   alert and oriented x3.    EKG  Procedure date:  01/24/2010  Findings:      NSR.  70 bpm.  RBBB.  LAFB.  Impression & Recommendations:  Problem # 1:  CAROTID STENOSIS (ICD-433.10) Will need follow up USN I will review old chrt.  She was placed on plavix between 2002 and 2004.  No sure needs. Her updated medication list for this problem includes:    Adult Aspirin Low Strength 81 Mg Tbdp (Aspirin) .Marland Kitchen... Take 1 tablet by mouth once a day    Plavix 75 Mg Tabs (Clopidogrel bisulfate) .Marland Kitchen... Take 1 tablet by mouth once a day  Orders: Carotid Duplex (Carotid Duplex)  Problem # 2:  ATRIAL FIBRILLATION (ICD-427.31) only episode at time of bronchitis.  None since.  Problem # 3:  CAD, NATIVE VESSEL (ICD-414.01) No symptoms to sugg ischemia.  Problem # 4:  HYPERLIPIDEMIA-MIXED (ICD-272.4) Did not tolerate  statins.  Other Orders: EKG w/ Interpretation (93000)  Patient Instructions: 1)  Your physician has requested that you have a carotid duplex. This test is an ultrasound of the carotid arteries in your neck. It looks at blood flow through these arteries that supply the brain with blood. Allow one hour for this exam. There are no restrictions or special instructions. 2)  Your physician wants you to follow-up in: 6 months  You will receive a reminder letter in the mail two months in advance. If you don't receive a letter, please call our office to schedule the follow-up appointment.   Appended Document: 6 month rov.sl Reviewed records.  Patient has CV disease.  Placed on Plavix for this in 2003/2004.  No data to say it will prevent CVA.  I rec that she stop.  Continue ASA. I called patient.  Pt is reluctant.  Worried about having stroke.  Carotid dopplers scheduled for tomorrow.  Continue for now.

## 2010-06-28 NOTE — Progress Notes (Signed)
Summary: CMN    Phone Note From Other Clinic   Caller: Nurse Summary of Call: Per Olegario Messier calling about CMN from [facility] about order that was sent ofc [phone] Initial call taken by: Edman Circle,  November 08, 2009 1:04 PM  Follow-up for Phone Call        11/08/09--1315--LMTCB--nt 11/08/09--1400--spoke with Olegario Messier at [facility]--retrieved the order for oxygen from EMR from dr schaller and faxed it to her for ms Abigail [name]--nt Follow-up by: Ledon Snare, RN,  November 08, 2009 2:10 PM

## 2010-06-28 NOTE — Progress Notes (Signed)
Summary: refill request  Medications Added ATENOLOL 25 MG TABS (ATENOLOL) 1 tab once daily       Phone Note Refill Request Message from:  Patient on February 08, 2010 2:04 PM  Refills Requested: Medication #1:  PLAVIX 75 MG TABS Take 1 tablet by mouth once a day  Medication #2:  ATENOLOL 25 MG TABS 1 tab every day [facility] in [place] [id number]   Method Requested: Telephone to Pharmacy Initial call taken by: Glynda Jaeger,  February 08, 2010 2:04 PM    New/Updated Medications: ATENOLOL 25 MG TABS (ATENOLOL) 1 tab once daily Prescriptions: ATENOLOL 25 MG TABS (ATENOLOL) 1 tab once daily  #0 x 0   Entered by:   Burnett Kanaris, CNA   Authorized by:   Sherrill Raring, MD, [facility]   Signed by:   Burnett Kanaris, CNA on 02/08/2010   Method used:   Electronically to        [facility]* (retail)       [address]       [place], [place]  [address]       Ph: [phone]       Fax: [phone]   RxID:   [id number] ATENOLOL 25 MG TABS (ATENOLOL) 1 tab once daily  #30 x 3   Entered by:   Burnett Kanaris, CNA   Authorized by:   Sherrill Raring, MD, [facility]   Signed by:   Burnett Kanaris, CNA on 02/08/2010   Method used:   Electronically to        [facility]* (retail)       [address]       [place], [place]  [address]       Ph: [phone]       Fax: [phone]   RxID:   [id number]

## 2010-06-28 NOTE — Letter (Signed)
Summary: [employer] [employer] Sales Form  [facility] Equipment Sales Form   Imported By: Roderic Ovens 11/17/2009 10:31:20  _____________________________________________________________________  External Attachment:    Type:   Image     Comment:   External Document

## 2010-06-28 NOTE — Assessment & Plan Note (Signed)
Summary: DIZZY   Vital Signs:  Patient profile:   77 year old female Weight:      150 pounds Temp:     97.4 degrees F oral Pulse rate:   76 / minute Pulse rhythm:   regular BP sitting:   140 / 66  (left arm) Cuff size:   large  Vitals Entered By: Sydell Axon LPN (April 14, 2010 11:16 AM) CC: Dizzy X 2 days   History of Present Illness: Here for dizziness which started at 2AM  2 days ago in the middle of the night. She took it easy the next day and things seemingly were improving and now thinks sxs worsening today. She feels "weak headed"..she has had some room spinning around but feels "unnecessary." She hasn't vomitted but has had some nausea. Nothing seems to help. Sitting in a recliner made it better. Lying or the act of lying down seemingly makes no difference. Getting up makes the sxs happen but no worse. She was in the store shopping this AM and the sxs happened. She is eating regularly and nml. She is not urinating as much. She does not drink enough fluids. She has 2 cups of coffee in  AM and couple glasses of tea, some cranberry juice.  She had carotid studies done a month ago which were unchanged and Plavix was stopped. She is not worried about stopping the Plavix altho the notes say she was hesitant. She is intolerant to statins but is on grapeseed. She does not feel sick, she has rhinitis and runny eyes since her cancer diagnoisis in the hosp. She denies cough or congestion. She has not vomited but again has had some mild nausea.  Problems Prior to Update: 1)  Carotid Stenosis  (ICD-433.10) 2)  Dysuria  (ICD-788.1) 3)  Hair Loss  (ICD-704.00) 4)  Atrial Fibrillation  (ICD-427.31) 5)  Aortic Atherosclerosis  (ICD-440.0) 6)  Hypertension, Benign  (ICD-401.1) 7)  Hyperlipidemia-mixed  (ICD-272.4) 8)  Cad, Native Vessel  (ICD-414.01) 9)  Low Back Pain Syndrome  (ICD-724.2) 10)  Ankle Edema  (ICD-782.3) 11)  Preoperative Examination  (ICD-V72.84) 12)  Rectal Bleeding   (ICD-569.3) 13)  Hypertension  (ICD-401.9) 14)  Uri  (ICD-465.9) 15)  Goiter, Multinodular, Thyroid  (ICD-241.1) 16)  Gi Bleeding, Small Intest With Anemia  (ICD-578.9) 17)  Tachycardia  (ICD-785.0) 18)  Hypercholesterolemia, 239/ldl 159  (ICD-272.0) 19)  Arteriovenous Malformation, Duodenum  (ICD-747.60) 20)  Carcinoma, Colon, S/p Resection  (ICD-153.9) 21)  Tobacco Abuse, Hx of  (ICD-V15.82) 22)  Reflux Gastritis  (ICD-535.40) 23)  Atherosclerosis  (ICD-440.9) 24)  Leg Cramps With Mild Claudication  (ICD-729.82) 25)  Sinus Tachycardia  (ICD-427.89) 26)  Peripheral Vascular Disease  (ICD-443.9) 27)  Diverticulosis, Colon  (ICD-562.10) 28)  Colonic Polyps, Hx of  (ICD-V12.72) 29)  COPD  (ICD-496)  Medications Prior to Update: 1)  Adult Aspirin Low Strength 81 Mg  Tbdp (Aspirin) .... Take 1 Tablet By Mouth Once A Day 2)  Grape Seed .... 2 Tabs Once Daily 3)  Centrum Silver   Tabs (Multiple Vitamins-Minerals) .... Take 1 Tablet By Mouth Once A Day 4)  Hemocyte 324 Mg  Tabs (Ferrous Fumarate) .... Take 1 Tablet By Mouth Once A Day 5)  Prilosec 20 Mg  Cpdr (Omeprazole) .... Take 1 Capsule By Mouth Once A Day 6)  Nitroquick 0.4 Mg  Subl (Nitroglycerin) .Marland Kitchen.. 1 Under Tongue Every 5 Min X 3 As Needed 7)  Tylenol Extra Strength 500 Mg  Tabs (Acetaminophen) .Marland KitchenMarland KitchenMarland Kitchen  Per Bottle 8)  Advair Diskus 250-50 Mcg/dose  Misc (Fluticasone-Salmeterol) .... One Inh Two Times A Day 9)  Fish Oil 1000 Mg Caps (Omega-3 Fatty Acids) .Marland Kitchen.. 1 Daily By Mouth 10)  Vitamin B-12 500 Mcg Tabs (Cyanocobalamin) .... Take One Tablet By Mouth Daily 11)  Biotin 1000 Mcg Tabs (Biotin) .... Take One Tablet Once Daily 12)  Vitamin D 1000 Unit Tabs (Cholecalciferol) .... Take One Tablet Daily 13)  Cranberry 500 Mg Caps (Cranberry) .... Take One Tablet Daily 14)  Atenolol 25 Mg Tabs (Atenolol) .Marland Kitchen.. 1 Tab Every Day 15)  Prednisone 20 Mg Tabs (Prednisone) .... Take 2 By Mouth Daily 16)  Ventolin Hfa 108 (90 Base) Mcg/act Aers  (Albuterol Sulfate) .... One Puff By Mouth Every 2-4 Hours As Needed 17)  Oxygen 2.5 Liters .... Use 2.5 Liters Daily 18)  Doxycycline Hyclate 100 Mg Caps (Doxycycline Hyclate) .... One Tab By Mouth Two Times A Day 19)  Atenolol 25 Mg Tabs (Atenolol) .Marland Kitchen.. 1 Tab Once Daily  Current Medications (verified): 1)  Adult Aspirin Low Strength 81 Mg  Tbdp (Aspirin) .... Take 1 Tablet By Mouth Once A Day 2)  Grape Seed .... 2 Tabs Once Daily 3)  Centrum Silver   Tabs (Multiple Vitamins-Minerals) .... Take 1 Tablet By Mouth Once A Day 4)  Hemocyte 324 Mg  Tabs (Ferrous Fumarate) .... Take 1 Tablet By Mouth Once A Day 5)  Prilosec 20 Mg  Cpdr (Omeprazole) .... Take 1 Capsule By Mouth Once A Day 6)  Nitroquick 0.4 Mg  Subl (Nitroglycerin) .Marland Kitchen.. 1 Under Tongue Every 5 Min X 3 As Needed 7)  Tylenol Extra Strength 500 Mg  Tabs (Acetaminophen) .... Per Bottle 8)  Advair Diskus 250-50 Mcg/dose  Misc (Fluticasone-Salmeterol) .... One Inh Two Times A Day 9)  Fish Oil 1000 Mg Caps (Omega-3 Fatty Acids) .Marland Kitchen.. 1 Daily By Mouth 10)  Vitamin B-12 500 Mcg Tabs (Cyanocobalamin) .... Take One Tablet By Mouth Daily 11)  Biotin 1000 Mcg Tabs (Biotin) .... Take One Tablet Once Daily 12)  Vitamin D 1000 Unit Tabs (Cholecalciferol) .... Take One Tablet Daily 13)  Atenolol 25 Mg Tabs (Atenolol) .Marland Kitchen.. 1 Tab Every Day 14)  Ventolin Hfa 108 (90 Base) Mcg/act Aers (Albuterol Sulfate) .... One Puff By Mouth Every 2-4 Hours As Needed 15)  Oxygen 2.5 Liters .... Use 2.5 Liters Daily 16)  Potassium Gluconate 595 Mg Tabs (Potassium Gluconate) .... Take One By Mouth Daily  Allergies: 1)  ! * Symbicort 2)  * Sulfa (Sulfonamides) Group  Physical Exam  General:  Well developed, well nourished, in no acute distress, comfortable and baseline, even with varying positions. Head:  normocephalic, atraumatic, and no abnormalities observed.  Hair thin but robust. Eyes:  vision grossly intact, pupils equal, pupils round, and pupils reactive to  light.   Ears:  External ear exam shows no significant lesions or deformities.  Otoscopic examination reveals clear canals, tympanic membranes are intact bilaterally without bulging but are dukll to LR and move poorly. There is no overt inflammation or discharge. Hearing is grossly normal bilaterally. Nose:  External nasal examination shows no deformity or inflammation. Nasal mucosa are pink and moist without lesions or exudates. Mouth:  pharynx pink and moist.   Neck:  supple with full rom and no masses or thyromegally, no JVD or carotid bruit  Chest Wall:  No deformities, masses, or tenderness noted. Lungs:  Normal respiratory effort, chest expands symmetrically. Lungs are clear to auscultation, no crackles or wheezes.  Heart:  regular rate, rhythm. Normal S1, S2. No S3. No significant murmur Abdomen:  No suprapubic tenderness. Neurologic:  No cranial nerve deficits noted. Station and gait are normal. Sensory, motor and coordinative functions appear intact.   Impression & Recommendations:  Problem # 1:  DIZZINESS (ICD-780.4) Assessment New  Does not seem to be vertigo or labyrinthitis. Does not seem to be cardiac, pulmonary or gastrointestinal. She c/o some discharge and has wondered herself if she could have a cold. TMs are dull to LR and have poor mobility. Will treat as URI and have her return  if sxs worsen.  Complete Medication List: 1)  Adult Aspirin Low Strength 81 Mg Tbdp (Aspirin) .... Take 1 tablet by mouth once a day 2)  Grape Seed  .... 2 tabs once daily 3)  Centrum Silver Tabs (Multiple vitamins-minerals) .... Take 1 tablet by mouth once a day 4)  Hemocyte 324 Mg Tabs (Ferrous fumarate) .... Take 1 tablet by mouth once a day 5)  Prilosec 20 Mg Cpdr (Omeprazole) .... Take 1 capsule by mouth once a day 6)  Nitroquick 0.4 Mg Subl (Nitroglycerin) .Marland Kitchen.. 1 under tongue every 5 min x 3 as needed 7)  Tylenol Extra Strength 500 Mg Tabs (Acetaminophen) .... Per bottle 8)  Advair Diskus  250-50 Mcg/dose Misc (Fluticasone-salmeterol) .... One inh two times a day 9)  Fish Oil 1000 Mg Caps (Omega-3 fatty acids) .Marland Kitchen.. 1 daily by mouth 10)  Vitamin B-12 500 Mcg Tabs (Cyanocobalamin) .... Take one tablet by mouth daily 11)  Biotin 1000 Mcg Tabs (Biotin) .... Take one tablet once daily 12)  Vitamin D 1000 Unit Tabs (Cholecalciferol) .... Take one tablet daily 13)  Atenolol 25 Mg Tabs (Atenolol) .Marland Kitchen.. 1 tab every day 14)  Ventolin Hfa 108 (90 Base) Mcg/act Aers (Albuterol sulfate) .... One puff by mouth every 2-4 hours as needed 15)  Oxygen 2.5 Liters  .... Use 2.5 liters daily 16)  Potassium Gluconate 595 Mg Tabs (Potassium gluconate) .... Take one by mouth daily  Patient Instructions: 1)  Take Guaifenesin by going to [facility] or [facility] and getting MUCOUS RELIEF EXPECTORANT (400mg ), take 11/2 tabs by mouth AM and NOON. 2)  Drink lots of fluids anytime taking Guaifenesin.  3)  Drink plenty of fluids, water best.   Orders Added: 1)  Est. Patient Level III [44010]    Current Allergies (reviewed today): ! * SYMBICORT * SULFA (SULFONAMIDES) GROUP

## 2010-06-28 NOTE — Letter (Signed)
Summary: Nadara Eaton letter  [place] [facility]  [address] [place], [place] [address]   Phone: [phone]  Fax: [phone]       01/04/2010 MRN: [id number]  [facility] [address] [place], [place]  [address]  Dear Ms. [facility] [place], and [facility] announce the retirement of Arta Silence, M.D., from full-time practice at the [facility] office effective November 25, 2009 and his plans of returning part-time.  It is important to Dr. Hetty Ely and to our practice that you understand that [facility] has seven physicians in our office for your health care needs.  We will continue to offer the same exceptional care that you have today.    Dr. Hetty Ely has spoken to many of you about his plans for retirement and returning part-time in the fall.   We will continue to work with you through the transition to schedule appointments for you in the office and meet the high standards that [facility] is committed to.   Again, it is with great pleasure that we share the news that Dr. Hetty Ely will return to [facility] at [facility] in October of 2011 with a reduced schedule.    If you have any questions, or would like to request an appointment with one of our physicians, please call us at [phone] and press the option for Scheduling an appointment.  We take pleasure in providing you with excellent patient care and look forward to seeing you at your next office visit.  Our [facility] Physicians are:  Tillman Abide, M.D. Laurita Quint, M.D. Roxy Manns, M.D. Kerby Nora, M.D. Hannah Beat, M.D. Ruthe Mannan, M.D. We proudly welcomed Raechel Ache, M.D. and Eustaquio Boyden, M.D. to the practice in July/August 2011.  Sincerely,  [facility]

## 2010-06-28 NOTE — Assessment & Plan Note (Signed)
Summary: FLU SHOT/CLE   Nurse Visit   Allergies: 1)  ! * Symbicort 2)  * Sulfa (Sulfonamides) Group  Immunizations Administered:  Influenza Vaccine # 1:    Vaccine Type: Fluvax 3+    Site: left deltoid    Mfr: [employer]    Dose: 0.5 ml    Route: IM    Given by: Mervin Hack CMA (AAMA)    Exp. Date: 11/26/2010    Lot #: [id number]    VIS given: 12/21/09 version given February 17, 2010.  Flu Vaccine Consent Questions:    Do you have a history of severe allergic reactions to this vaccine? no    Any prior history of allergic reactions to egg and/or gelatin? no    Do you have a sensitivity to the preservative Thimersol? no    Do you have a past history of Guillan-Barre Syndrome? no    Do you currently have an acute febrile illness? no    Have you ever had a severe reaction to latex? no    Vaccine information given and explained to patient? yes    Are you currently pregnant? no  Orders Added: 1)  Flu Vaccine 56yrs + [90658] 2)  Admin 1st Vaccine [09811]

## 2010-06-28 NOTE — Progress Notes (Signed)
Summary: pt rtn your call   Phone Note Call from Patient Call back at Home Phone [phone]   Caller: Patient Reason for Call: Talk to Nurse, Talk to Doctor Summary of Call: pt rtn your call Initial call taken by: Omer Jack,  February 14, 2010 4:09 PM  Follow-up for Phone Call        Called patient back...she stopped Plavix 1 week ago. Advised her that Dr.Ross was OK with this. Layne Benton, RN, BSN  February 14, 2010 5:17 PM

## 2010-06-28 NOTE — Assessment & Plan Note (Signed)
Summary: not feeling any better broncitias/rbh   Vital Signs:  Patient profile:   77 year old female Weight:      151.50 pounds O2 Sat:      88 % on Room air Temp:     98.1 degrees F oral Pulse rate:   60 / minute Pulse rhythm:   regular BP sitting:   128 / 70  (left arm) Cuff size:   regular  Vitals Entered By: Sydell Axon LPN (November 15, 2009 2:12 PM)  O2 Flow:  Room air CC: Difficulty breathing, productive cough and not feeling any better   History of Present Illness: Pt here with friend for congestion. She is not coughing much and hasn't all along. She is cougghing up some but "not as much as she should" but thinks she here's it and feels she needs to bring it up. She took Zithromax and prednisone. She is taking  some Robitussin.  She has had no fever but had some chills at the beach last [place]. She has headache frontally, no ear pain, some ST, some bloody rhinitis, minimal cough.  Problems Prior to Update: 1)  Bronchitis- Acute  (ICD-466.0) 2)  Dysuria  (ICD-788.1) 3)  Hair Loss  (ICD-704.00) 4)  Atrial Fibrillation  (ICD-427.31) 5)  Aortic Atherosclerosis  (ICD-440.0) 6)  Hypertension, Benign  (ICD-401.1) 7)  Hyperlipidemia-mixed  (ICD-272.4) 8)  Cad, Native Vessel  (ICD-414.01) 9)  Low Back Pain Syndrome  (ICD-724.2) 10)  Ankle Edema  (ICD-782.3) 11)  Preoperative Examination  (ICD-V72.84) 12)  Rectal Bleeding  (ICD-569.3) 13)  Hypertension  (ICD-401.9) 14)  Uri  (ICD-465.9) 15)  Goiter, Multinodular, Thyroid  (ICD-241.1) 16)  Gi Bleeding, Small Intest With Anemia  (ICD-578.9) 17)  Tachycardia  (ICD-785.0) 18)  Hypercholesterolemia, 239/ldl 159  (ICD-272.0) 19)  Arteriovenous Malformation, Duodenum  (ICD-747.60) 20)  Carcinoma, Colon, S/p Resection  (ICD-153.9) 21)  Tobacco Abuse, Hx of  (ICD-V15.82) 22)  Reflux Gastritis  (ICD-535.40) 23)  Atherosclerosis  (ICD-440.9) 24)  Leg Cramps With Mild Claudication  (ICD-729.82) 25)  Sinus Tachycardia  (ICD-427.89) 26)   Peripheral Vascular Disease  (ICD-443.9) 27)  Diverticulosis, Colon  (ICD-562.10) 28)  Colonic Polyps, Hx of  (ICD-V12.72) 29)  COPD  (ICD-496)  Medications Prior to Update: 1)  Adult Aspirin Low Strength 81 Mg  Tbdp (Aspirin) .... Take 1 Tablet By Mouth Once A Day 2)  Plavix 75 Mg Tabs (Clopidogrel Bisulfate) .... Take 1 Tablet By Mouth Once A Day 3)  Grape Seed .... 2 Tabs Once Daily 4)  Centrum Silver   Tabs (Multiple Vitamins-Minerals) .... Take 1 Tablet By Mouth Once A Day 5)  Hemocyte 324 Mg  Tabs (Ferrous Fumarate) .... Take 1 Tablet By Mouth Once A Day 6)  Prilosec 20 Mg  Cpdr (Omeprazole) .... Take 1 Capsule By Mouth Once A Day 7)  Nitroquick 0.4 Mg  Subl (Nitroglycerin) .Marland Kitchen.. 1 Under Tongue Every 5 Min X 3 As Needed 8)  Tylenol Extra Strength 500 Mg  Tabs (Acetaminophen) .... Per Bottle 9)  Advair Diskus 250-50 Mcg/dose  Misc (Fluticasone-Salmeterol) .... One Inh Two Times A Day 10)  Fish Oil 1000 Mg Caps (Omega-3 Fatty Acids) .Marland Kitchen.. 1 Daily By Mouth 11)  Vitamin B-12 500 Mcg Tabs (Cyanocobalamin) .... Take One Tablet By Mouth Daily 12)  Biotin 1000 Mcg Tabs (Biotin) .... Take One Tablet Once Daily 13)  Vitamin D 1000 Unit Tabs (Cholecalciferol) .... Take One Tablet Daily 14)  Cranberry 500 Mg Caps (Cranberry) .... Take One  Tablet Daily 15)  Pyridium 200 Mg Tabs (Phenazopyridine Hcl) .... One Tab By Mouth Three Times A Day 16)  Atenolol 25 Mg Tabs (Atenolol) .Marland Kitchen.. 1 Tab Every Day 17)  Prednisone 20 Mg Tabs (Prednisone) .... Take 2 By Mouth Daily 18)  Ventolin Hfa 108 (90 Base) Mcg/act Aers (Albuterol Sulfate) .... One Puff By Mouth Every 2-4 Hours As Needed 19)  Oxygen 2.5 Liters .... Use 2.5 Liters Daily  Allergies: 1)  ! * Symbicort 2)  * Sulfa (Sulfonamides) Group  Physical Exam  General:  Well developed, well nourished, in no acute distress. Head:  normocephalic, atraumatic, and no abnormalities observed.  Hair thin but robust. Eyes:  vision grossly intact, pupils equal,  pupils round, and pupils reactive to light.   Ears:  External ear exam shows no significant lesions or deformities.  Otoscopic examination reveals clear canals, tympanic membranes are intact bilaterally without bulging, retraction, inflammation or discharge. Hearing is grossly normal bilaterally. Nose:  External nasal examination shows no deformity or inflammation. Nasal mucosa are pink and moist without lesions or exudates. N/C in place. Mouth:  pharynx pink and moist.   Neck:  supple with full rom and no masses or thyromegally, no JVD or carotid bruit  Chest Wall:  No deformities, masses, or tenderness noted. Lungs:  Normal respiratory effort, chest expands symmetrically. Lungs are clear to auscultation, no crackles or wheezes. Heart:  regular rate, rhythm. Normal S1, S2. No S3. No significant murmur   Impression & Recommendations:  Problem # 1:  BRONCHITIS- ACUTE (ICD-466.0) Assessment Unchanged Seems still there will retreat with Doxy for coverage. Take Robitussin Plain as dir, 2 tbsp AM and noon. Her updated medication list for this problem includes:    Advair Diskus 250-50 Mcg/dose Misc (Fluticasone-salmeterol) ..... One inh two times a day    Ventolin Hfa 108 (90 Base) Mcg/act Aers (Albuterol sulfate) ..... One puff by mouth every 2-4 hours as needed    Doxycycline Hyclate 100 Mg Caps (Doxycycline hyclate) ..... One tab by mouth two times a day  Orders: Prescription Created Electronically [id number])  Complete Medication List: 1)  Adult Aspirin Low Strength 81 Mg Tbdp (Aspirin) .... Take 1 tablet by mouth once a day 2)  Plavix 75 Mg Tabs (Clopidogrel bisulfate) .... Take 1 tablet by mouth once a day 3)  Grape Seed  .... 2 tabs once daily 4)  Centrum Silver Tabs (Multiple vitamins-minerals) .... Take 1 tablet by mouth once a day 5)  Hemocyte 324 Mg Tabs (Ferrous fumarate) .... Take 1 tablet by mouth once a day 6)  Prilosec 20 Mg Cpdr (Omeprazole) .... Take 1 capsule by mouth once a  day 7)  Nitroquick 0.4 Mg Subl (Nitroglycerin) .Marland Kitchen.. 1 under tongue every 5 min x 3 as needed 8)  Tylenol Extra Strength 500 Mg Tabs (Acetaminophen) .... Per bottle 9)  Advair Diskus 250-50 Mcg/dose Misc (Fluticasone-salmeterol) .... One inh two times a day 10)  Fish Oil 1000 Mg Caps (Omega-3 fatty acids) .Marland Kitchen.. 1 daily by mouth 11)  Vitamin B-12 500 Mcg Tabs (Cyanocobalamin) .... Take one tablet by mouth daily 12)  Biotin 1000 Mcg Tabs (Biotin) .... Take one tablet once daily 13)  Vitamin D 1000 Unit Tabs (Cholecalciferol) .... Take one tablet daily 14)  Cranberry 500 Mg Caps (Cranberry) .... Take one tablet daily 15)  Atenolol 25 Mg Tabs (Atenolol) .Marland Kitchen.. 1 tab every day 16)  Prednisone 20 Mg Tabs (Prednisone) .... Take 2 by mouth daily 17)  Ventolin Hfa 108 (  90 Base) Mcg/act Aers (Albuterol sulfate) .... One puff by mouth every 2-4 hours as needed 18)  Oxygen 2.5 Liters  .... Use 2.5 liters daily 19)  Doxycycline Hyclate 100 Mg Caps (Doxycycline hyclate) .... One tab by mouth two times a day  Patient Instructions: 1)  RTC as needed. Prescriptions: DOXYCYCLINE HYCLATE 100 MG CAPS (DOXYCYCLINE HYCLATE) one tab by mouth two times a day  #20 x 0   Entered and Authorized by:   Shaune Leeks MD   Signed by:   Shaune Leeks MD on 11/15/2009   Method used:   Electronically to        [facility]* (retail)       [address]       [place], [place]  [address]       Ph: [phone]       Fax: [phone]   RxID:   [id number]   Current Allergies (reviewed today): ! * SYMBICORT * SULFA (SULFONAMIDES) GROUP

## 2010-06-28 NOTE — Miscellaneous (Signed)
  Clinical Lists Changes  Medications: Removed medication of AMLODIPINE BESYLATE 5 MG TABS (AMLODIPINE BESYLATE) 1 tablet every day Added new medication of ATENOLOL 25 MG TABS (ATENOLOL) 1 tab every day - Signed Rx of ATENOLOL 25 MG TABS (ATENOLOL) 1 tab every day;  #30 x 6;  Signed;  Entered by: Layne Benton, RN, BSN;  Authorized by: Sherrill Raring, MD, [facility];  Method used: Electronically to [facility]*, [address], [place], [place]  [address], Ph: [phone], Fax: [phone]    Prescriptions: ATENOLOL 25 MG TABS (ATENOLOL) 1 tab every day  #30 x 6   Entered by:   Layne Benton, RN, BSN   Authorized by:   Sherrill Raring, MD, [facility]   Signed by:   Layne Benton, RN, BSN on 08/26/2009   Method used:   Electronically to        [facility]* (retail)       [address]       [place], [place]  [address]       Ph: [phone]       Fax: [phone]   RxID:   [id number]

## 2010-06-28 NOTE — Letter (Signed)
Summary: [facility]  [facility]   Imported By: Beau Fanny 11/11/2009 09:35:30  _____________________________________________________________________  External Attachment:    Type:   Image     Comment:   External Document

## 2010-06-28 NOTE — Assessment & Plan Note (Signed)
Summary: PER  CHECK OUT/SF   Visit Type:  Follow-up Primary Provider:  Hetty Ely  CC:  no cardiac complaints Pt is having hair loss.  History of Present Illness: Abigail [name] is an 77 year old with a hsitory of CAD, CV disease, PVOD, dyslipidemia, COPD and PAF.  I last saw her in June 2010. Since seen, she denies chest pain.  She denies significant shortness of breath.  She does need her O2 renewed for when she is active.  No palpitations. Biggest concern is why her hair is thinning so much.  Current Medications (verified): 1)  Adult Aspirin Low Strength 81 Mg  Tbdp (Aspirin) .... Take 1 Tablet By Mouth Once A Day 2)  Plavix 75 Mg Tabs (Clopidogrel Bisulfate) .... Take 1 Tablet By Mouth Once A Day 3)  Grape Seed .... 2 Tabs Once Daily 4)  Centrum Silver   Tabs (Multiple Vitamins-Minerals) .... Take 1 Tablet By Mouth Once A Day 5)  Hemocyte 324 Mg  Tabs (Ferrous Fumarate) .... Take 1 Tablet By Mouth Once A Day 6)  Prilosec 20 Mg  Cpdr (Omeprazole) .... Take 1 Capsule By Mouth Once A Day 7)  Atenolol 25 Mg Tabs (Atenolol) .... Take 1 Tablet By Mouth Once A Day 8)  Nitroquick 0.4 Mg  Subl (Nitroglycerin) .Marland Kitchen.. 1 Under Tongue Every 5 Min X 3 As Needed 9)  Tylenol Extra Strength 500 Mg  Tabs (Acetaminophen) .... Per Bottle 10)  Advair Diskus 250-50 Mcg/dose  Misc (Fluticasone-Salmeterol) .... One Inh Two Times A Day 11)  Fish Oil 1000 Mg Caps (Omega-3 Fatty Acids) .Marland Kitchen.. 1 Daily By Mouth 12)  Vitamin B-12 500 Mcg Tabs (Cyanocobalamin) .... Take One Tablet By Mouth Daily 13)  Biotin 1000 Mcg Tabs (Biotin) .... Take One Tablet Once Daily 14)  Vitamin D 1000 Unit Tabs (Cholecalciferol) .... Take One Tablet Daily 15)  Cranberry 500 Mg Caps (Cranberry) .... Take One Tablet Daily  Allergies (verified): 1)  ! * Symbicort 2)  * Sulfa (Sulfonamides) Group  Past History:  Past Medical History: Last updated: 05/27/2008 Coronary artery disease, s/p PTCA RCA (Dr. Orlena Sheldon) Peripheral Vascular  Disease Atrial fibrillation Hypertension Anemia COPD (Emphysemaous with asthmatic component - FEV! 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) GOITER, MULTINODULAR, THYROID (ICD-241.1) Hx esophagitis, gastritis, duodenitis TACHYCARDIA (ICD-785.0) HYPERCHOLESTEROLEMIA, 239/LDL 159 (ICD-272.0) ARTERIOVENOUS MALFORMATION, DUODENUM (ICD-747.60) CARCINOMA, COLON, S/P RESECTION (ICD-153.9) TOBACCO ABUSE, HX OF (ICD-V15.82) REFLUX GASTRITIS (ICD-535.40) ATHEROSCLEROSIS (ICD-440.9) LEG CRAMPS WITH MILD CLAUDICATION (ICD-729.82) SINUS TACHYCARDIA (ICD-427.89) PERIPHERAL VASCULAR DISEASE (ICD-443.9) DIVERTICULOSIS, COLON (ICD-562.10) COLONIC POLYPS, HX OF (ICD-V12.72) COPD (ICD-496) R hip fracture  Past Surgical History: Last updated: 05/27/2008 Colon resection 01/99 (cancer and carcinoid tumor) Abd. U/S- Gallstones 06/99 Ileostomy 01/99---takedown 7/99 Cath, stent/ER ok LE's 10/00 PV aortography-stent 10/00 S/P fem-pop R CA angioplastied 07/00 Hosp.- incarcerated ventral hernia (abd. surg) MRSA 06/28/00 Stent--common iliacs bilat-0816/01 EGD neg 10/20/02 Colonoscopy wnl 10/21/02- hosp., EGD neg Thyroid U/S, goiter- bx 06/26/03 Pinning R hip Sept 2009 Bilateral cataract surg 2004 TAH L Fem neck Fx ORIF 08/03/06 PV W/U Minimal LE Dz (Dr Earnestine Leys) 11/05/07 R Fem neck Fx ORIF (Dr Jerl Santos) 02/20/08  Family History: Last updated: 2006-12-23 Father: Died at the age of 65 of cancer of the stomach Mother: Died at the age of 87 of kidney failure Siblings: Three brothers and 2 sisters, all deceased, 1 from stroke, 1 from pneumonia with stroke, 1 with cancer of unknown type, and 1 sister with genital cancer  Social History: Last updated: 05/27/2008 Marital Status: Widowed Patient states former smoker. Quit 1998  Review of Systems       All systems reviewed.  Negatvie to the above problem except as noted.  Vital Signs:  Patient  profile:   77 year old female Height:      63 inches Weight:      155 pounds Pulse rate:   54 / minute BP sitting:   150 / 70  (left arm) Cuff size:   regular  Vitals Entered By: Burnett Kanaris, CNA (July 09, 2009 11:17 AM)  Physical Exam  Additional Exam:  HEENT:  Normocephalic, atraumatic. EOMI, PERRLA.  Neck: JVP is normal. No thyromegaly. No bruits.  Lungs: clear to auscultation. No rales no wheezes.  Heart: Regular rate and rhythm. Normal S1, S2. No S3.   No significant murmurs. PMI not displaced.  Abdomen:  Supple, nontender. Normal bowel sounds. No masses. No hepatomegaly.  Extremities:  No lower extremity edema.  Musculoskelletal:  Frail.  LE  mildly weak Neuro:   alert and oriented x3.    EKG  Procedure date:  07/09/2009  Findings:      NSR.  54 bpm.  RBBB.  Impression & Recommendations:  Problem # 1:  ATRIAL FIBRILLATION (ICD-427.31) She is concerned about hair loss.  Baseline HR is low.  IVery low dose Atenolo. I would recomm switching to Norvasc.  Keep on ASA and Plavix Keep atenolol if palpitations recur. The following medications were removed from the medication list:    Atenolol 25 Mg Tabs (Atenolol) .Marland Kitchen... Take 1 tablet by mouth once a day Her updated medication list for this problem includes:    Adult Aspirin Low Strength 81 Mg Tbdp (Aspirin) .Marland Kitchen... Take 1 tablet by mouth once a day    Plavix 75 Mg Tabs (Clopidogrel bisulfate) .Marland Kitchen... Take 1 tablet by mouth once a day  Orders: EKG w/ Interpretation (93000)  Problem # 2:  CAD, NATIVE VESSEL (ICD-414.01) No signs of angina.  Keep on medical Rx. The following medications were removed from the medication list:    Atenolol 25 Mg Tabs (Atenolol) .Marland Kitchen... Take 1 tablet by mouth once a day Her updated medication list for this problem includes:    Adult Aspirin Low Strength 81 Mg Tbdp (Aspirin) .Marland Kitchen... Take 1 tablet by mouth once a day    Plavix 75 Mg Tabs (Clopidogrel bisulfate) .Marland Kitchen... Take 1 tablet by mouth once a  day    Nitroquick 0.4 Mg Subl (Nitroglycerin) .Marland Kitchen... 1 under tongue every 5 min x 3 as needed    Amlodipine Besylate 5 Mg Tabs (Amlodipine besylate) .Marland Kitchen... 1 tablet every day  Problem # 3:  HYPERLIPIDEMIA-MIXED (ICD-272.4) Not tolerant to meds  Last LDL was 111; HDL 71.  Continue diet.  Problem # 4:  HYPERTENSION, BENIGN (ICD-401.1) Switch as noted above. The following medications were removed from the medication list:    Atenolol 25 Mg Tabs (Atenolol) .Marland Kitchen... Take 1 tablet by mouth once a day Her updated medication list for this problem includes:    Adult Aspirin Low Strength 81 Mg Tbdp (Aspirin) .Marland Kitchen... Take 1 tablet by mouth once a day    Amlodipine Besylate 5 Mg Tabs (Amlodipine besylate) .Marland Kitchen... 1 tablet every day  Problem # 5:  COPD (ICD-496) Basline O2 at rest is 93%.  With waling decreased to 84%.  Will fill out papers for O2.  Encouraged her to use. Her updated medication list for this problem includes:    Advair Diskus  250-50 Mcg/dose Misc (Fluticasone-salmeterol) ..... One inh two times a day Prescriptions: AMLODIPINE BESYLATE 5 MG TABS (AMLODIPINE BESYLATE) 1 tablet every day  #30 x 6   Entered by:   Layne Benton, RN, BSN   Authorized by:   Sherrill Raring, MD, [facility]   Signed by:   Layne Benton, RN, BSN on 07/09/2009   Method used:   Faxed to ...       [facility]* (retail)       [address]       [place], [place]  [address]       Ph: [phone]       Fax: [phone]   RxID:   [id number]

## 2010-06-28 NOTE — Progress Notes (Signed)
Summary: pt is doing better  Phone Note Call from Patient   Caller: Patient Call For: Shaune Leeks MD Summary of Call: Pt was seen last week, called today to let you know that she is doing better, she is ok. Initial call taken by: Lowella Petties CMA, AAMA,  April 20, 2010 8:18 AM  Follow-up for Phone Call        Noted. Thank you. Follow-up by: Shaune Leeks MD,  April 20, 2010 8:32 AM

## 2010-06-28 NOTE — Assessment & Plan Note (Signed)
Summary: TALK ABOUT DOING BLOOD WORK/DLO   Vital Signs:  Patient profile:   77 year old female Weight:      155 pounds Temp:     97.5 degrees F oral Pulse rate:   64 / minute Pulse rhythm:   regular BP sitting:   140 / 68  (left arm) Cuff size:   regular  Vitals Entered By: Linde Gillis CMA Duncan Dull) (June 29, 2009 11:51 AM) CC: discuss doing blood work   History of Present Illness: is having a hair problem- hair is coming out  ? if any of her medicines  none are new   started 6-8 mo ago  is worse in the back of the crown of the head  thinning hair - but no bald spot no family hx of hair loss   is feeling ok in general  has not had blood work done in a long time   ? last time she had a visit or physical   does see cardiologist soon   did start taking biotin over the counter   Allergies: 1)  ! * Symbicort 2)  * Sulfa (Sulfonamides) Group  Past History:  Past Medical History: Last updated: 05/27/2008 Coronary artery disease, s/p PTCA RCA (Dr. Orlena Sheldon) Peripheral Vascular Disease Atrial fibrillation Hypertension Anemia COPD (Emphysemaous with asthmatic component - FEV! 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) GOITER, MULTINODULAR, THYROID (ICD-241.1) Hx esophagitis, gastritis, duodenitis TACHYCARDIA (ICD-785.0) HYPERCHOLESTEROLEMIA, 239/LDL 159 (ICD-272.0) ARTERIOVENOUS MALFORMATION, DUODENUM (ICD-747.60) CARCINOMA, COLON, S/P RESECTION (ICD-153.9) TOBACCO ABUSE, HX OF (ICD-V15.82) REFLUX GASTRITIS (ICD-535.40) ATHEROSCLEROSIS (ICD-440.9) LEG CRAMPS WITH MILD CLAUDICATION (ICD-729.82) SINUS TACHYCARDIA (ICD-427.89) PERIPHERAL VASCULAR DISEASE (ICD-443.9) DIVERTICULOSIS, COLON (ICD-562.10) COLONIC POLYPS, HX OF (ICD-V12.72) COPD (ICD-496) R hip fracture  Past Surgical History: Last updated: 05/27/2008 Colon resection 01/99 (cancer and carcinoid tumor) Abd. U/S- Gallstones 06/99 Ileostomy  01/99---takedown 7/99 Cath, stent/ER ok LE's 10/00 PV aortography-stent 10/00 S/P fem-pop R CA angioplastied 07/00 Hosp.- incarcerated ventral hernia (abd. surg) MRSA 06/28/00 Stent--common iliacs bilat-0816/01 EGD neg 10/20/02 Colonoscopy wnl 10/21/02- hosp., EGD neg Thyroid U/S, goiter- bx 06/26/03 Pinning R hip Sept 2009 Bilateral cataract surg 2004 TAH L Fem neck Fx ORIF 08/03/06 PV W/U Minimal LE Dz (Dr Earnestine Leys) 11/05/07 R Fem neck Fx ORIF (Dr Jerl Santos) 02/20/08  Family History: Last updated: 12/21/06 Father: Died at the age of 75 of cancer of the stomach Mother: Died at the age of 24 of kidney failure Siblings: Three brothers and 2 sisters, all deceased, 1 from stroke, 1 from pneumonia with stroke, 1 with cancer of unknown type, and 1 sister with genital cancer  Social History: Last updated: 05/27/2008 Marital Status: Widowed Patient states former smoker. Quit 1998  Risk Factors: Caffeine Use: 1 (12/10/2007) Exercise: yes (12/10/2007)  Risk Factors: Smoking Status: quit (06/24/2007) Passive Smoke Exposure: no (12/10/2007)  Review of Systems General:  Denies fatigue, fever, loss of appetite, and malaise. Eyes:  Denies blurring, discharge, and eye irritation. ENT:  Denies sinus pressure and sore throat. CV:  Denies chest pain or discomfort and palpitations. Resp:  Denies cough and shortness of breath. MS:  Denies cramps and muscle weakness. Derm:  Complains of hair loss; denies itching, lesion(s), poor wound healing, and rash. Neuro:  Denies numbness, tingling, tremors, and weakness. Psych:  mood is ok . Endo:  Denies cold intolerance, excessive thirst, excessive urination, and heat intolerance. Heme:  Denies abnormal bruising and bleeding.  Physical Exam  General:  Well developed, well  nourished, in no acute distress. Head:  normocephalic, atraumatic, and no abnormalities observed.   Eyes:  vision grossly intact, pupils equal, pupils round, and pupils reactive  to light.   Mouth:  pharynx pink and moist.   Neck:  supple with full rom and no masses or thyromegally, no JVD or carotid bruit  Lungs:  Normal respiratory effort, chest expands symmetrically. Lungs are clear to auscultation, no crackles or wheezes. Heart:  regular rate, rhythm. Normal S1, S2. No S3. No significant murmur Extremities:  No clubbing, cyanosis, edema, or deformity noted with normal full range of motion of all joints.   Neurologic:  sensation intact to light touch, gait normal, and DTRs symmetrical and normal.   Skin:  very mild hair thinning post crown no focal allopecia hair appears healthy- no breaking off  skin is nl  some ecchymosis due to blood thinners Cervical Nodes:  No lymphadenopathy noted Psych:  somewhat stoic affect   Impression & Recommendations:  Problem # 1:  HAIR LOSS (ICD-704.00) Assessment New non focal - without family hx disc poss causes - is on beta blocker but that has been long time will check lab incl tsh today and update  consider derm ref if all is nl  adv that biotin would take 6 mo to work if it does at all Orders: Venipuncture (14782) TLB-Lipid Panel (80061-LIPID) TLB-Renal Function Panel (80069-RENAL) TLB-CBC Platelet - w/Differential (85025-CBCD) TLB-Hepatic/Liver Function Pnl (80076-HEPATIC) TLB-TSH (Thyroid Stimulating Hormone) (84443-TSH)  Problem # 2:  HYPERTENSION, BENIGN (ICD-401.1) Assessment: Unchanged  labs today- foward to primary doctor fair control with atenolol Her updated medication list for this problem includes:    Atenolol 25 Mg Tabs (Atenolol) .Marland Kitchen... Take 1 tablet by mouth once a day  Orders: Venipuncture (95621) TLB-Lipid Panel (80061-LIPID) TLB-Renal Function Panel (80069-RENAL) TLB-CBC Platelet - w/Differential (85025-CBCD) TLB-Hepatic/Liver Function Pnl (80076-HEPATIC) TLB-TSH (Thyroid Stimulating Hormone) (84443-TSH)  BP today: 140/68 Prior BP: 140/70 (03/12/2009)  Labs Reviewed: K+: 4.1  (12/10/2007) Creat: : 1.1 (12/10/2007)   Chol: 208 (11/13/2007)   HDL: 62.9 (11/13/2007)   LDL: DEL (11/13/2007)   TG: 82 (11/13/2007)  Problem # 3:  HYPERLIPIDEMIA-MIXED (ICD-272.4) Assessment: Unchanged  due for labs on low fat diet will foward to primary  Orders: Venipuncture (30865) TLB-Lipid Panel (80061-LIPID) TLB-Renal Function Panel (80069-RENAL) TLB-CBC Platelet - w/Differential (85025-CBCD) TLB-Hepatic/Liver Function Pnl (80076-HEPATIC) TLB-TSH (Thyroid Stimulating Hormone) (84443-TSH)  Labs Reviewed: SGOT: 25 (11/13/2007)   SGPT: 22 (11/13/2007)   HDL:62.9 (11/13/2007), 56.4 (07/16/2007)  LDL:DEL (11/13/2007), 116 (07/16/2007)  Chol:208 (11/13/2007), 192 (07/16/2007)  Trig:82 (11/13/2007), 99 (07/16/2007)  Complete Medication List: 1)  Adult Aspirin Low Strength 81 Mg Tbdp (Aspirin) .... Take 1 tablet by mouth once a day 2)  Plavix 75 Mg Tabs (Clopidogrel bisulfate) .... Take 1 tablet by mouth once a day 3)  Grape Seed  .... 2 tabs once daily 4)  Centrum Silver Tabs (Multiple vitamins-minerals) .... Take 1 tablet by mouth once a day 5)  Hemocyte 324 Mg Tabs (Ferrous fumarate) .... Take 1 tablet by mouth once a day 6)  Prilosec 20 Mg Cpdr (Omeprazole) .... Take 1 capsule by mouth once a day 7)  Atenolol 25 Mg Tabs (Atenolol) .... Take 1 tablet by mouth once a day 8)  Nitroquick 0.4 Mg Subl (Nitroglycerin) .Marland Kitchen.. 1 under tongue every 5 min x 3 as needed 9)  Tylenol Extra Strength 500 Mg Tabs (Acetaminophen) .... Per bottle 10)  Advair Diskus 250-50 Mcg/dose Misc (Fluticasone-salmeterol) .... One inh two times a day  11)  Fish Oil 1000 Mg Caps (Omega-3 fatty acids) .Marland Kitchen.. 1 daily by mouth 12)  Vitamin B-12 500 Mcg Tabs (Cyanocobalamin) .... Take one tablet by mouth daily 13)  Biotin 1000 Mcg Tabs (Biotin) .... Take one tablet once daily 14)  Vitamin D 1000 Unit Tabs (Cholecalciferol) .... Take one tablet daily 15)  Cranberry 500 Mg Caps (Cranberry) .... Take one tablet  daily  Patient Instructions: 1)  doing labs today for hair loss and also for cholesterol  2)  will update you with result  3)  if no cause if found for hair loss- will refer you to dermatology   Current Allergies (reviewed today): ! * SYMBICORT * SULFA (SULFONAMIDES) GROUP

## 2010-06-28 NOTE — Assessment & Plan Note (Signed)
Summary: PER DR Kelci Petrella/CLE   Vital Signs:  Patient profile:   77 year old female Weight:      152.50 pounds BMI:     27.11 O2 Sat:      97 % on 2.5 L/min Temp:     97.9 degrees F oral Pulse rate:   60 / minute Pulse rhythm:   regular BP sitting:   160 / 78  (left arm) Cuff size:   regular  Vitals Entered By: Sydell Axon LPN (November 04, 863 10:08 AM)  O2 Flow:  2.5 L/min CC: Follow-up from Saturday clinic, dx with bronchitis   History of Present Illness: Pt here for O2 followup. She uses O2 at night and has done that since her cancer diagnosis. She has recently noted needing more frequently, especially in the summertime when it is hot and humid outside or when she is warm, whether inside or out.  She was also seen 6/4 for bronchitis at [facility] and put on Zithromax, Prednisone taper and Albuterol. She has done well with that with no further fever and breathing significantly improved. She stays active and goes to the local country dances with her female friend and kick up qiuite a bit on the dancefloor til trhey both get tired, which they both admit happens sooner than it used to! She otherwise has no complaints and tries to stay positive and focus on the good things.  Problems Prior to Update: 1)  Dysuria  (ICD-788.1) 2)  Hair Loss  (ICD-704.00) 3)  Atrial Fibrillation  (ICD-427.31) 4)  Aortic Atherosclerosis  (ICD-440.0) 5)  Hypertension, Benign  (ICD-401.1) 6)  Hyperlipidemia-mixed  (ICD-272.4) 7)  Cad, Native Vessel  (ICD-414.01) 8)  Low Back Pain Syndrome  (ICD-724.2) 9)  Ankle Edema  (ICD-782.3) 10)  Preoperative Examination  (ICD-V72.84) 11)  Rectal Bleeding  (ICD-569.3) 12)  Hypertension  (ICD-401.9) 13)  Uri  (ICD-465.9) 14)  Goiter, Multinodular, Thyroid  (ICD-241.1) 15)  Gi Bleeding, Small Intest With Anemia  (ICD-578.9) 16)  Tachycardia  (ICD-785.0) 17)  Hypercholesterolemia, 239/ldl 159  (ICD-272.0) 18)  Arteriovenous Malformation, Duodenum  (ICD-747.60) 19)   Carcinoma, Colon, S/p Resection  (ICD-153.9) 20)  Tobacco Abuse, Hx of  (ICD-V15.82) 21)  Reflux Gastritis  (ICD-535.40) 22)  Atherosclerosis  (ICD-440.9) 23)  Leg Cramps With Mild Claudication  (ICD-729.82) 24)  Sinus Tachycardia  (ICD-427.89) 25)  Peripheral Vascular Disease  (ICD-443.9) 26)  Diverticulosis, Colon  (ICD-562.10) 27)  Colonic Polyps, Hx of  (ICD-V12.72) 28)  COPD  (ICD-496)  Medications Prior to Update: 1)  Adult Aspirin Low Strength 81 Mg  Tbdp (Aspirin) .... Take 1 Tablet By Mouth Once A Day 2)  Plavix 75 Mg Tabs (Clopidogrel Bisulfate) .... Take 1 Tablet By Mouth Once A Day 3)  Grape Seed .... 2 Tabs Once Daily 4)  Centrum Silver   Tabs (Multiple Vitamins-Minerals) .... Take 1 Tablet By Mouth Once A Day 5)  Hemocyte 324 Mg  Tabs (Ferrous Fumarate) .... Take 1 Tablet By Mouth Once A Day 6)  Prilosec 20 Mg  Cpdr (Omeprazole) .... Take 1 Capsule By Mouth Once A Day 7)  Nitroquick 0.4 Mg  Subl (Nitroglycerin) .Marland Kitchen.. 1 Under Tongue Every 5 Min X 3 As Needed 8)  Tylenol Extra Strength 500 Mg  Tabs (Acetaminophen) .... Per Bottle 9)  Advair Diskus 250-50 Mcg/dose  Misc (Fluticasone-Salmeterol) .... One Inh Two Times A Day 10)  Fish Oil 1000 Mg Caps (Omega-3 Fatty Acids) .Marland Kitchen.. 1 Daily By Mouth 11)  Vitamin  B-12 500 Mcg Tabs (Cyanocobalamin) .... Take One Tablet By Mouth Daily 12)  Biotin 1000 Mcg Tabs (Biotin) .... Take One Tablet Once Daily 13)  Vitamin D 1000 Unit Tabs (Cholecalciferol) .... Take One Tablet Daily 14)  Cranberry 500 Mg Caps (Cranberry) .... Take One Tablet Daily 15)  Pyridium 200 Mg Tabs (Phenazopyridine Hcl) .... One Tab By Mouth Three Times A Day 16)  Atenolol 25 Mg Tabs (Atenolol) .Marland Kitchen.. 1 Tab Every Day  Allergies: 1)  ! * Symbicort 2)  * Sulfa (Sulfonamides) Group  Physical Exam  General:  Well developed, well nourished, in no acute distress. Head:  normocephalic, atraumatic, and no abnormalities observed.  Hair thin but robust. Eyes:  vision grossly  intact, pupils equal, pupils round, and pupils reactive to light.   Ears:  External ear exam shows no significant lesions or deformities.  Otoscopic examination reveals clear canals, tympanic membranes are intact bilaterally without bulging, retraction, inflammation or discharge. Hearing is grossly normal bilaterally. Nose:  External nasal examination shows no deformity or inflammation. Nasal mucosa are pink and moist without lesions or exudates. N/C in place. Mouth:  pharynx pink and moist.   Neck:  supple with full rom and no masses or thyromegally, no JVD or carotid bruit  Lungs:  Normal respiratory effort, chest expands symmetrically. Lungs are clear to auscultation, no crackles or wheezes. Heart:  regular rate, rhythm. Normal S1, S2. No S3. No significant murmur   Impression & Recommendations:  Problem # 1:  COPD (ICD-496) Assessment Deteriorated Gradually getting worse with need for O2 increasing. PO2 today on 2l per [place] 97%. Her updated medication list for this problem includes:    Advair Diskus 250-50 Mcg/dose Misc (Fluticasone-salmeterol) ..... One inh two times a day    Ventolin Hfa 108 (90 Base) Mcg/act Aers (Albuterol sulfate) ..... One puff by mouth every 2-4 hours as needed  Problem # 2:  BRONCHITIS- ACUTE (ICD-466.0) Assessment: Improved  Seen 6/4, better since. Finish meds and avoid Albuterol as able. Given Advair samples.  Her updated medication list for this problem includes:    Advair Diskus 250-50 Mcg/dose Misc (Fluticasone-salmeterol) ..... One inh two times a day    Ventolin Hfa 108 (90 Base) Mcg/act Aers (Albuterol sulfate) ..... One puff by mouth every 2-4 hours as needed  Take antibiotics and other medications as directed. Encouraged to push clear liquids, get enough rest, and take acetaminophen as needed. To be seen in 5-7 days if no improvement, sooner if worse.  Problem # 3:  HYPERTENSION, BENIGN (ICD-401.1) Assessment: Unchanged Will follow. On steroids and  slight edema. Recheck in the future. Her updated medication list for this problem includes:    Atenolol 25 Mg Tabs (Atenolol) .Marland Kitchen... 1 tab every day  BP today: 160/78 Prior BP: 138/78 (08/11/2009)  Labs Reviewed: K+: 4.0 (06/29/2009) Creat: : 1.0 (06/29/2009)   Chol: 201 (06/29/2009)   HDL: 71.10 (06/29/2009)   LDL: DEL (11/13/2007)   TG: 163.0 (06/29/2009)  Complete Medication List: 1)  Adult Aspirin Low Strength 81 Mg Tbdp (Aspirin) .... Take 1 tablet by mouth once a day 2)  Plavix 75 Mg Tabs (Clopidogrel bisulfate) .... Take 1 tablet by mouth once a day 3)  Grape Seed  .... 2 tabs once daily 4)  Centrum Silver Tabs (Multiple vitamins-minerals) .... Take 1 tablet by mouth once a day 5)  Hemocyte 324 Mg Tabs (Ferrous fumarate) .... Take 1 tablet by mouth once a day 6)  Prilosec 20 Mg Cpdr (Omeprazole) .... Take  1 capsule by mouth once a day 7)  Nitroquick 0.4 Mg Subl (Nitroglycerin) .Marland Kitchen.. 1 under tongue every 5 min x 3 as needed 8)  Tylenol Extra Strength 500 Mg Tabs (Acetaminophen) .... Per bottle 9)  Advair Diskus 250-50 Mcg/dose Misc (Fluticasone-salmeterol) .... One inh two times a day 10)  Fish Oil 1000 Mg Caps (Omega-3 fatty acids) .Marland Kitchen.. 1 daily by mouth 11)  Vitamin B-12 500 Mcg Tabs (Cyanocobalamin) .... Take one tablet by mouth daily 12)  Biotin 1000 Mcg Tabs (Biotin) .... Take one tablet once daily 13)  Vitamin D 1000 Unit Tabs (Cholecalciferol) .... Take one tablet daily 14)  Cranberry 500 Mg Caps (Cranberry) .... Take one tablet daily 15)  Pyridium 200 Mg Tabs (Phenazopyridine hcl) .... One tab by mouth three times a day 16)  Atenolol 25 Mg Tabs (Atenolol) .Marland Kitchen.. 1 tab every day 17)  Prednisone 20 Mg Tabs (Prednisone) .... Take 2 by mouth daily 18)  Ventolin Hfa 108 (90 Base) Mcg/act Aers (Albuterol sulfate) .... One puff by mouth every 2-4 hours as needed 19)  Oxygen 2.5 Liters  .... Use 2.5 liters daily  Patient Instructions: 1)  Take Guaifenesin by going to [facility] or [facility] and getting MUCOUS RELIEF EXPECTORANT (400mg ), take 11/2 tabs by mouth AM and NOON. 2)  Drink lots of fluids anytime taking Guaifenesin.   Current Allergies (reviewed today): ! * SYMBICORT * SULFA (SULFONAMIDES) GROUP

## 2010-06-28 NOTE — Progress Notes (Signed)
Summary: pt light headed   Phone Note Call from Patient Call back at Home Phone [phone]   Reason for Call: Talk to Nurse, Talk to Doctor Summary of Call: per pt call her b/pmeds where changed last week and now she is feeling light headed Initial call taken by: Omer Jack,  July 12, 2009 8:06 AM  Follow-up for Phone Call        Called patient.  Feels light headed since starting Norvasc 5 mg Rec:  1.  hold today.  2.  Cut in 1/2 and take 2.5 qday   3.  Take BP  4.  Call later this wk with how feeling May need to go back to b blocker.  Stopped because of hair loss.  See if helps. She does not think she is in afib. Follow-up by: Sherrill Raring, MD, [facility],  July 12, 2009 9:45 AM

## 2010-06-29 ENCOUNTER — Encounter: Payer: Self-pay | Admitting: Family Medicine

## 2010-06-30 ENCOUNTER — Encounter: Payer: Self-pay | Admitting: Family Medicine

## 2010-06-30 ENCOUNTER — Encounter: Payer: Self-pay | Admitting: Internal Medicine

## 2010-06-30 ENCOUNTER — Ambulatory Visit (INDEPENDENT_AMBULATORY_CARE_PROVIDER_SITE_OTHER): Payer: Medicare Other | Admitting: Family Medicine

## 2010-06-30 ENCOUNTER — Ambulatory Visit: Admit: 2010-06-30 | Payer: Self-pay | Admitting: Internal Medicine

## 2010-06-30 ENCOUNTER — Ambulatory Visit (INDEPENDENT_AMBULATORY_CARE_PROVIDER_SITE_OTHER): Payer: Medicare Other | Admitting: Internal Medicine

## 2010-06-30 DIAGNOSIS — G8918 Other acute postprocedural pain: Secondary | ICD-10-CM

## 2010-06-30 DIAGNOSIS — E7800 Pure hypercholesterolemia, unspecified: Secondary | ICD-10-CM

## 2010-06-30 DIAGNOSIS — L989 Disorder of the skin and subcutaneous tissue, unspecified: Secondary | ICD-10-CM

## 2010-06-30 DIAGNOSIS — B370 Candidal stomatitis: Secondary | ICD-10-CM

## 2010-06-30 DIAGNOSIS — M6281 Muscle weakness (generalized): Secondary | ICD-10-CM

## 2010-06-30 DIAGNOSIS — I2510 Atherosclerotic heart disease of native coronary artery without angina pectoris: Secondary | ICD-10-CM

## 2010-06-30 DIAGNOSIS — R269 Unspecified abnormalities of gait and mobility: Secondary | ICD-10-CM

## 2010-06-30 DIAGNOSIS — M8448XD Pathological fracture, other site, subsequent encounter for fracture with routine healing: Secondary | ICD-10-CM

## 2010-06-30 NOTE — Assessment & Plan Note (Signed)
Summary: pain in back/nt   Vital Signs:  Patient profile:   77 year old female Weight:      152 pounds Temp:     98.4 degrees F oral Pulse rate:   68 / minute Pulse rhythm:   regular BP sitting:   150 / 84  (left arm) Cuff size:   regular  Vitals Entered By: [name] Dance CMA Duncan Dull) (May 24, 2010 9:15 AM) CC: Thoracic pain x3-4 days   History of Present Illness: CC: thoracic pain  Complicated 77yo with h/o CAD, COPD, h/o carcinoid tumor presents new to me with:  3-4 day h/o pain at R shoulder blade, used absorbi Jr rub and heating pad which seemed to help some but didn't resolve ache.  Starts R shoulderblade, travels to other side.  Sometimes has SOB when feels sharp pain.  Better with sitting still in one spot.  Worse with movement.  On way here, felt pain with each bump in road.  slightly pleuritic, no pressure or change with exertion.    No CP/tightness, abd pain, n/v/d, fevers/chills.  No cough or cold sxs.  no hoarsness  Current Medications (verified): 1)  Adult Aspirin Low Strength 81 Mg  Tbdp (Aspirin) .... Take 1 Tablet By Mouth Once A Day 2)  Grape Seed .... 2 Tabs Once Daily 3)  Centrum Silver   Tabs (Multiple Vitamins-Minerals) .... Take 1 Tablet By Mouth Once A Day 4)  Hemocyte 324 Mg  Tabs (Ferrous Fumarate) .... Take 1 Tablet By Mouth Once A Day 5)  Prilosec 20 Mg  Cpdr (Omeprazole) .... Take 1 Capsule By Mouth Once A Day 6)  Nitroquick 0.4 Mg  Subl (Nitroglycerin) .Marland Kitchen.. 1 Under Tongue Every 5 Min X 3 As Needed 7)  Tylenol Extra Strength 500 Mg  Tabs (Acetaminophen) .... Per Bottle 8)  Advair Diskus 250-50 Mcg/dose  Misc (Fluticasone-Salmeterol) .... One Inh Two Times A Day 9)  Fish Oil 1000 Mg Caps (Omega-3 Fatty Acids) .Marland Kitchen.. 1 Daily By Mouth 10)  Vitamin B-12 500 Mcg Tabs (Cyanocobalamin) .... Take One Tablet By Mouth Daily 11)  Biotin 1000 Mcg Tabs (Biotin) .... Take One Tablet Once Daily 12)  Vitamin D 1000 Unit Tabs (Cholecalciferol) .... Take One Tablet  Daily 13)  Atenolol 25 Mg Tabs (Atenolol) .Marland Kitchen.. 1 Tab Every Day 14)  Ventolin Hfa 108 (90 Base) Mcg/act Aers (Albuterol Sulfate) .... One Puff By Mouth Every 2-4 Hours As Needed 15)  Oxygen 2.5 Liters .... Use 2.5 Liters Daily 16)  Potassium Gluconate 595 Mg Tabs (Potassium Gluconate) .... Take One By Mouth Daily  Allergies: 1)  ! * Symbicort 2)  * Sulfa (Sulfonamides) Group  Past History:  Past Medical History: Last updated: 05/27/2008 Coronary artery disease, s/p PTCA RCA (Dr. Orlena Sheldon) Peripheral Vascular Disease Atrial fibrillation Hypertension Anemia COPD (Emphysemaous with asthmatic component - FEV! 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) GOITER, MULTINODULAR, THYROID (ICD-241.1) Hx esophagitis, gastritis, duodenitis TACHYCARDIA (ICD-785.0) HYPERCHOLESTEROLEMIA, 239/LDL 159 (ICD-272.0) ARTERIOVENOUS MALFORMATION, DUODENUM (ICD-747.60) CARCINOMA, COLON, S/P RESECTION (ICD-153.9) TOBACCO ABUSE, HX OF (ICD-V15.82) REFLUX GASTRITIS (ICD-535.40) ATHEROSCLEROSIS (ICD-440.9) LEG CRAMPS WITH MILD CLAUDICATION (ICD-729.82) SINUS TACHYCARDIA (ICD-427.89) PERIPHERAL VASCULAR DISEASE (ICD-443.9) DIVERTICULOSIS, COLON (ICD-562.10) COLONIC POLYPS, HX OF (ICD-V12.72) COPD (ICD-496) R hip fracture  Past Surgical History: Last updated: 05/27/2008 Colon resection 01/99 (cancer and carcinoid tumor) Abd. U/S- Gallstones 06/99 Ileostomy 01/99---takedown 7/99 Cath, stent/ER ok LE's 10/00 PV aortography-stent 10/00 S/P fem-pop R CA angioplastied 07/00 Hosp.- incarcerated ventral  hernia (abd. surg) MRSA 06/28/00 Stent--common iliacs bilat-0816/01 EGD neg 10/20/02 Colonoscopy wnl 10/21/02- hosp., EGD neg Thyroid U/S, goiter- bx 06/26/03 Pinning R hip Sept 2009 Bilateral cataract surg 2004 TAH L Fem neck Fx ORIF 08/03/06 PV W/U Minimal LE Dz (Dr Earnestine Leys) 11/05/07 R Fem neck Fx ORIF (Dr Jerl Santos) 02/20/08  Social History: Last  updated: 05/27/2008 Marital Status: Widowed Patient states former smoker. Quit 1998  Review of Systems       per HPI  Physical Exam  General:  Well developed, well nourished, in no acute distress.  uncomfortable with certain positions on exam table Head:  normocephalic, atraumatic, and no abnormalities observed.  Hair thin but robust. Mouth:  pharynx pink and moist.   Neck:  supple with full rom and no masses or thyromegally, no JVD.  + R carotid bruit (known) Lungs:  Normal respiratory effort, chest expands symmetrically. Lungs are clear to auscultation, no crackles or wheezes. Heart:  slightly irregular rhythm.  Normal S1, S2. No S3. No significant murmur Msk:  negative spurlings bilaterally. + tender over midline thoracic spine as well as R scapular region.  + reproducible pain with palpation of scapula and midline thoracic vertebrae.  + tight thoracic paraspinous mm on L  Pulses:  2+ rad pulses Extremities:  no pedal edema   Impression & Recommendations:  Problem # 1:  BACK PAIN, THORACIC REGION, LEFT (ICD-724.1) given reproducible with palpation and worsened by movement, suspect likley MSK - either pulled muscle or ligament strain.  however given risk factors, checked CXR - stable cardiomegaly.  nothing acute, no widening of mediastinum compared to 10/2009.  possible T5 compression fracture?  Treat with scheduled tylenol and flexeril, return if not better.  Advised to seek urgent care if worsening.  Pt reports taking calcium (although not on med list) and taking vit D 1000 units daily.  Her updated medication list for this problem includes:    Adult Aspirin Low Strength 81 Mg Tbdp (Aspirin) .Marland Kitchen... Take 1 tablet by mouth once a day    Tylenol Extra Strength 500 Mg Tabs (Acetaminophen) .Marland Kitchen... Per bottle    Flexeril 5 Mg Tabs (Cyclobenzaprine hcl) .Marland Kitchen... Take one by mouth two times a day as needed muscle spasm, sedation precautions  Orders: T-2 View CXR (71020TC)  Complete Medication  List: 1)  Adult Aspirin Low Strength 81 Mg Tbdp (Aspirin) .... Take 1 tablet by mouth once a day 2)  Grape Seed  .... 2 tabs once daily 3)  Centrum Silver Tabs (Multiple vitamins-minerals) .... Take 1 tablet by mouth once a day 4)  Hemocyte 324 Mg Tabs (Ferrous fumarate) .... Take 1 tablet by mouth once a day 5)  Prilosec 20 Mg Cpdr (Omeprazole) .... Take 1 capsule by mouth once a day 6)  Nitroquick 0.4 Mg Subl (Nitroglycerin) .Marland Kitchen.. 1 under tongue every 5 min x 3 as needed 7)  Tylenol Extra Strength 500 Mg Tabs (Acetaminophen) .... Per bottle 8)  Advair Diskus 250-50 Mcg/dose Misc (Fluticasone-salmeterol) .... One inh two times a day 9)  Fish Oil 1000 Mg Caps (Omega-3 fatty acids) .Marland Kitchen.. 1 daily by mouth 10)  Vitamin B-12 500 Mcg Tabs (Cyanocobalamin) .... Take one tablet by mouth daily 11)  Biotin 1000 Mcg Tabs (Biotin) .... Take one tablet once daily 12)  Vitamin D 1000 Unit Tabs (Cholecalciferol) .... Take one tablet daily 13)  Atenolol 25 Mg Tabs (Atenolol) .Marland Kitchen.. 1 tab every day 14)  Ventolin Hfa 108 (90 Base) Mcg/act Aers (Albuterol sulfate) .... One puff  by mouth every 2-4 hours as needed 15)  Oxygen 2.5 Liters  .... Use 2.5 liters daily 16)  Potassium Gluconate 595 Mg Tabs (Potassium gluconate) .... Take one by mouth daily 17)  Flexeril 5 Mg Tabs (Cyclobenzaprine hcl) .... Take one by mouth two times a day as needed muscle spasm, sedation precautions  Patient Instructions: 1)  Sounds like muscular pain. 2)  treat with muscle relaxant and schedule tylenol extra strength 500mg  2 pills three times a day for next 3 days.   3)  If not better, please return to be seen at end of week. 4)  If getting WORSE, please seek medical care. Prescriptions: FLEXERIL 5 MG TABS (CYCLOBENZAPRINE HCL) take one by mouth two times a day as needed muscle spasm, sedation precautions  #30 x 0   Entered and Authorized by:   Eustaquio Boyden  MD   Signed by:   Eustaquio Boyden  MD on 05/24/2010   Method used:    Electronically to        [facility] [address]* (retail)       [address]       [place], [place]  [address]       Ph: [phone]       Fax: [phone]   RxID:   [id number]    Orders Added: 1)  T-2 View CXR [71020TC] 2)  Est. Patient Level IV [72536]    Current Allergies (reviewed today): ! * SYMBICORT * SULFA (SULFONAMIDES) GROUP  Appended Document: Orders Update     Clinical Lists Changes  Orders: Added new Service order of Prescription Created Electronically [phone]) - Signed

## 2010-06-30 NOTE — Progress Notes (Signed)
Summary: Outgoing call to patient  Phone Note Outgoing Call   Call placed by: Janee Morn CMA [facility] Dull),  May 25, 2010 9:45 AM Call placed to: Patient Summary of Call: Spoke with patient for an update. She said she isn't any better. She said things aren't any worse but she hasn't noticed any change at all. I told her to continue with current treatment plan and I would call her back with any changes. She verbalized understanding. Initial call taken by: Janee Morn CMA [facility] Dull),  May 25, 2010 9:49 AM  Follow-up for Phone Call        please update me with how pt is doing - if no change with scheduled 1000mg  tylenol three times a day and flexeril, let me know and I will write short course narcotics if pt desires to treat compression fracture (advise to take with stool softener).  Also please update how much calcium/vit D pt is taking.  was supposed to call me with amt calcium.  if no better next week to schedule f/u appt with myself or pcp. Follow-up by: Eustaquio Boyden  MD,  May 27, 2010 8:16 AM  Additional Follow-up for Phone Call Additional follow up Details #1::        Spoke with patient and she said she is no better at all. She does request a pain medicine. Advised her to take a stool softener to help with any constipation. She looked through her meds while on the phone with me. She is taking Vit D3 1000 units once daily but no calcium. I told her to schedule a follow up next week if pain was no better. She verbalized understanding.  Additional Follow-up by: Janee Morn CMA [facility] Dull),  May 27, 2010 10:10 AM    Additional Follow-up for Phone Call Additional follow up Details #2::    pt to f/u if not better next week. short course vicodin sent to pharmacy.  advise to decrease Extra Strength Tylenol to 1 pill three times a day and take vicodin three times a day as needed breakthrough pain. at f/u likely would rec start calcium supplementation and bisphosphonate for osteoporosis. if  returns and no better, would consider calcitonin and dedicated thoracic xray to further eval compression fx. Follow-up by: Eustaquio Boyden  MD,  May 27, 2010 10:22 AM  Additional Follow-up for Phone Call Additional follow up Details #3:: Details for Additional Follow-up Action Taken: Rx called in as directed. Patient notified and will come back in if no any better next week. Instructed her to decrease ES Tylenol and add the Vicodin as directed. She verbalized understanding.  Additional Follow-up by: Janee Morn CMA [facility] Dull),  May 27, 2010 10:55 AM  New/Updated Medications: VICODIN 5-500 MG TABS (HYDROCODONE-ACETAMINOPHEN) take one by mouth three times a day as needed breakthrough back pain Prescriptions: VICODIN 5-500 MG TABS (HYDROCODONE-ACETAMINOPHEN) take one by mouth three times a day as needed breakthrough back pain  #20 x 0   Entered and Authorized by:   Eustaquio Boyden  MD   Signed by:   Janee Morn CMA (AAMA) on 05/27/2010   Method used:   Telephoned to ...       [facility] [address] (retail)       [address]       [place], [place]  [address]       Ph: [phone]       Fax: [phone]   RxID:   [id number]

## 2010-06-30 NOTE — Assessment & Plan Note (Signed)
Summary: F/U FRACTURED SHOULDER/CLE   Vital Signs:  Patient profile:   77 year old female Weight:      147 pounds Temp:     98.2 degrees F oral Pulse rate:   68 / minute Pulse rhythm:   regular BP sitting:   130 / 70  (left arm) Cuff size:   regular  Vitals Entered By: Sydell Axon LPN (June 22, 2010 2:26 PM) CC: Follow-up on fractured shoulder   History of Present Illness: Pt here for followup of compression fracture of the T4 vertebrae with subsequent kyphoplasty.  She has not had pain since.  HEr ortho suggested a DEXA.   Problems Prior to Update: 1)  Compression Fracture T4  (ICD-805.8) 2)  Dizziness  (ICD-780.4) 3)  Carotid Stenosis  (ICD-433.10) 4)  Dysuria  (ICD-788.1) 5)  Hair Loss  (ICD-704.00) 6)  Atrial Fibrillation  (ICD-427.31) 7)  Aortic Atherosclerosis  (ICD-440.0) 8)  Hypertension, Benign  (ICD-401.1) 9)  Hyperlipidemia-mixed  (ICD-272.4) 10)  Cad, Native Vessel  (ICD-414.01) 11)  Low Back Pain Syndrome  (ICD-724.2) 12)  Ankle Edema  (ICD-782.3) 13)  Preoperative Examination  (ICD-V72.84) 14)  Rectal Bleeding  (ICD-569.3) 15)  Hypertension  (ICD-401.9) 16)  Uri  (ICD-465.9) 17)  Goiter, Multinodular, Thyroid  (ICD-241.1) 18)  Gi Bleeding, Small Intest With Anemia  (ICD-578.9) 19)  Tachycardia  (ICD-785.0) 20)  Hypercholesterolemia, 239/ldl 159  (ICD-272.0) 21)  Arteriovenous Malformation, Duodenum  (ICD-747.60) 22)  Carcinoma, Colon, S/p Resection  (ICD-153.9) 23)  Tobacco Abuse, Hx of  (ICD-V15.82) 24)  Reflux Gastritis  (ICD-535.40) 25)  Atherosclerosis  (ICD-440.9) 26)  Leg Cramps With Mild Claudication  (ICD-729.82) 27)  Sinus Tachycardia  (ICD-427.89) 28)  Peripheral Vascular Disease  (ICD-443.9) 29)  Diverticulosis, Colon  (ICD-562.10) 30)  Colonic Polyps, Hx of  (ICD-V12.72) 31)  COPD  (ICD-496)  Medications Prior to Update: 1)  Adult Aspirin Low Strength 81 Mg  Tbdp (Aspirin) .... Take 1 Tablet By Mouth Once A Day 2)  Grape Seed ....  2 Tabs Once Daily 3)  Centrum Silver   Tabs (Multiple Vitamins-Minerals) .... Take 1 Tablet By Mouth Once A Day 4)  Hemocyte 324 Mg  Tabs (Ferrous Fumarate) .... Take 1 Tablet By Mouth Once A Day 5)  Prilosec 20 Mg  Cpdr (Omeprazole) .... Take 1 Capsule By Mouth Once A Day 6)  Nitroquick 0.4 Mg  Subl (Nitroglycerin) .Marland Kitchen.. 1 Under Tongue Every 5 Min X 3 As Needed 7)  Tylenol Extra Strength 500 Mg  Tabs (Acetaminophen) .... Per Bottle 8)  Advair Diskus 250-50 Mcg/dose  Misc (Fluticasone-Salmeterol) .... One Inh Two Times A Day 9)  Fish Oil 1000 Mg Caps (Omega-3 Fatty Acids) .Marland Kitchen.. 1 Daily By Mouth 10)  Vitamin B-12 500 Mcg Tabs (Cyanocobalamin) .... Take One Tablet By Mouth Daily 11)  Biotin 1000 Mcg Tabs (Biotin) .... Take One Tablet Once Daily 12)  Vitamin D 1000 Unit Tabs (Cholecalciferol) .... Take One Tablet Daily 13)  Atenolol 25 Mg Tabs (Atenolol) .Marland Kitchen.. 1 Tab Every Day 14)  Ventolin Hfa 108 (90 Base) Mcg/act Aers (Albuterol Sulfate) .... One Puff By Mouth Every 2-4 Hours As Needed 15)  Oxygen 2.5 Liters .... Use 2.5 Liters Daily 16)  Potassium Gluconate 595 Mg Tabs (Potassium Gluconate) .... Take One By Mouth Daily 17)  Flexeril 5 Mg Tabs (Cyclobenzaprine Hcl) .... Take One By Mouth Two Times A Day As Needed Muscle Spasm, Sedation Precautions 18)  Vicodin 5-500 Mg Tabs (Hydrocodone-Acetaminophen) .... Take One By  Mouth Three Times A Day As Needed Breakthrough Back Pain  Current Medications (verified): 1)  Adult Aspirin Low Strength 81 Mg  Tbdp (Aspirin) .... Take 1 Tablet By Mouth Once A Day 2)  Grape Seed .... 2 Tabs Once Daily 3)  Centrum Silver   Tabs (Multiple Vitamins-Minerals) .... Take 1 Tablet By Mouth Once A Day 4)  Hemocyte 324 Mg  Tabs (Ferrous Fumarate) .... Take 1 Tablet By Mouth Once A Day 5)  Prilosec 20 Mg  Cpdr (Omeprazole) .... Take 1 Capsule By Mouth Once A Day 6)  Nitroquick 0.4 Mg  Subl (Nitroglycerin) .Marland Kitchen.. 1 Under Tongue Every 5 Min X 3 As Needed 7)  Tylenol Extra  Strength 500 Mg  Tabs (Acetaminophen) .... Per Bottle 8)  Advair Diskus 250-50 Mcg/dose  Misc (Fluticasone-Salmeterol) .... One Inh Two Times A Day 9)  Fish Oil 1000 Mg Caps (Omega-3 Fatty Acids) .Marland Kitchen.. 1 Daily By Mouth 10)  Vitamin B-12 500 Mcg Tabs (Cyanocobalamin) .... Take One Tablet By Mouth Daily 11)  Biotin 1000 Mcg Tabs (Biotin) .... Take One Tablet Once Daily 12)  Vitamin D 1000 Unit Tabs (Cholecalciferol) .... Take One Tablet Daily 13)  Atenolol 25 Mg Tabs (Atenolol) .Marland Kitchen.. 1 Tab Every Day 14)  Ventolin Hfa 108 (90 Base) Mcg/act Aers (Albuterol Sulfate) .... One Puff By Mouth Every 2-4 Hours As Needed 15)  Oxygen 2.5 Liters .... Use 2.5 Liters Daily 16)  Potassium Gluconate 595 Mg Tabs (Potassium Gluconate) .... Take One By Mouth Daily 17)  Flexeril 5 Mg Tabs (Cyclobenzaprine Hcl) .... Take One By Mouth Two Times A Day As Needed Muscle Spasm, Sedation Precautions 18)  Vicodin 5-500 Mg Tabs (Hydrocodone-Acetaminophen) .... Take One By Mouth Three Times A Day As Needed Breakthrough Back Pain  Allergies: 1)  ! * Symbicort 2)  * Sulfa (Sulfonamides) Group  Physical Exam  General:  Well developed, well nourished, in no acute distress.  uncomfortable with certain positions on exam table Head:  normocephalic, atraumatic, and no abnormalities observed.  Hair thin but robust. Eyes:  vision grossly intact, pupils equal, pupils round, and pupils reactive to light.   Ears:  External ear exam shows no significant lesions or deformities.  Otoscopic examination reveals clear canals, tympanic membranes are intact bilaterally without bulging but are dukll to LR and move poorly. There is no overt inflammation or discharge. Hearing is grossly normal bilaterally. H/As in place bilat. Nose:  External nasal examination shows no deformity or inflammation. Nasal mucosa are pink and moist without lesions or exudates. Mouth:  pharynx pink and moist.   Neck:  supple with full rom and no masses or thyromegally,  no JVD.  + R carotid bruit (known) Lungs:  Normal respiratory effort, chest expands symmetrically. Lungs are clear to auscultation, no crackles or wheezes. Heart:  slightly irregular rhythm.  Normal S1, S2. No S3. No significant murmur Msk:  negative spurlings bilaterally. - tender over midline thoracic spine as well as R scapular region.  + reproducible pain with palpation of scapula and midline thoracic vertebrae.  + tight thoracic paraspinous mm on L    Impression & Recommendations:  Problem # 1:  COMPRESSION FRACTURE T4 (ICD-805.8) Assessment Improved Much better. We know she has osteoproosis. I see no need of DEXA. I would not put her on Biphosphonates. Will maximize tx. See instructions.  Complete Medication List: 1)  Adult Aspirin Low Strength 81 Mg Tbdp (Aspirin) .... Take 1 tablet by mouth once a day 2)  Grape Seed  .Marland KitchenMarland KitchenMarland Kitchen  2 tabs once daily 3)  Centrum Silver Tabs (Multiple vitamins-minerals) .... Take 1 tablet by mouth once a day 4)  Hemocyte 324 Mg Tabs (Ferrous fumarate) .... Take 1 tablet by mouth once a day 5)  Prilosec 20 Mg Cpdr (Omeprazole) .... Take 1 capsule by mouth once a day 6)  Nitroquick 0.4 Mg Subl (Nitroglycerin) .Marland Kitchen.. 1 under tongue every 5 min x 3 as needed 7)  Tylenol Extra Strength 500 Mg Tabs (Acetaminophen) .... Per bottle 8)  Advair Diskus 250-50 Mcg/dose Misc (Fluticasone-salmeterol) .... One inh two times a day 9)  Fish Oil 1000 Mg Caps (Omega-3 fatty acids) .Marland Kitchen.. 1 daily by mouth 10)  Vitamin B-12 500 Mcg Tabs (Cyanocobalamin) .... Take one tablet by mouth daily 11)  Biotin 1000 Mcg Tabs (Biotin) .... Take one tablet once daily 12)  Vitamin D 1000 Unit Tabs (Cholecalciferol) .... Take one tablet daily 13)  Atenolol 25 Mg Tabs (Atenolol) .Marland Kitchen.. 1 tab every day 14)  Ventolin Hfa 108 (90 Base) Mcg/act Aers (Albuterol sulfate) .... One puff by mouth every 2-4 hours as needed 15)  Oxygen 2.5 Liters  .... Use 2.5 liters daily 16)  Potassium Gluconate 595 Mg Tabs  (Potassium gluconate) .... Take one by mouth daily 17)  Flexeril 5 Mg Tabs (Cyclobenzaprine hcl) .... Take one by mouth two times a day as needed muscle spasm, sedation precautions 18)  Vicodin 5-500 Mg Tabs (Hydrocodone-acetaminophen) .... Take one by mouth three times a day as needed breakthrough back pain  Patient Instructions: 1)  RTC as needed. 2)  Take Calcium 1500mg  total a day. 3)  Take Vit D 1000Iu two times a day. 4)  Take OTC Magnesium once a day.  5)  Walk, be as active as possible.   Orders Added: 1)  Est. Patient Level III [44010]   Immunization History:  Pneumovax Immunization History:    Pneumovax:  pneumovax (05/27/2010)   Immunization History:  Pneumovax Immunization History:    Pneumovax:  Pneumovax (05/27/2010)  Current Allergies (reviewed today): ! * SYMBICORT * SULFA (SULFONAMIDES) GROUP

## 2010-06-30 NOTE — Miscellaneous (Signed)
Summary: [facility] Order  [facility] Order   Imported By: Beau Fanny 06/10/2010 11:43:55  _____________________________________________________________________  External Attachment:    Type:   Image     Comment:   External Document

## 2010-07-06 ENCOUNTER — Encounter (INDEPENDENT_AMBULATORY_CARE_PROVIDER_SITE_OTHER): Payer: Self-pay | Admitting: *Deleted

## 2010-07-06 ENCOUNTER — Other Ambulatory Visit: Payer: Self-pay | Admitting: Internal Medicine

## 2010-07-06 ENCOUNTER — Other Ambulatory Visit (INDEPENDENT_AMBULATORY_CARE_PROVIDER_SITE_OTHER): Payer: Medicare Other

## 2010-07-06 DIAGNOSIS — E785 Hyperlipidemia, unspecified: Secondary | ICD-10-CM

## 2010-07-06 LAB — LIPID PANEL
LDL Cholesterol: 113 mg/dL — ABNORMAL HIGH (ref 0–99)
Total CHOL/HDL Ratio: 3
VLDL: 20.2 mg/dL (ref 0.0–40.0)

## 2010-07-06 NOTE — Assessment & Plan Note (Signed)
Summary: 6 month,F6M/PE   Vital Signs:  Patient profile:   77 year old female Height:      63 inches Weight:      145.50 pounds BMI:     25.87 Pulse rate:   62 / minute Pulse rhythm:   regular Resp:     18 per minute BP sitting:   144 / 62  (right arm) Cuff size:   large  Vitals Entered By: Vikki Ports (June 30, 2010 11:39 AM)  Visit Type:  6 months follow up Primary Provider:  Hetty Ely  CC:  No complaints.  History of Present Illness: Abigail [name] is an 77 year old with a hsitory of CAD (cath 2002:  nonobstructive CAD; Cardiolite 2005 Normal), CV disease, PVOD, dyslipidemia, COPD and PAF.  I last saw her in the summer 2011.Marland Kitchen  Since senn she denies chest pain.  She does symptoms she thinks she is getting a cold.  No productive cough.  Current Medications (verified): 1)  Adult Aspirin Low Strength 81 Mg  Tbdp (Aspirin) .... Take 1 Tablet By Mouth Once A Day 2)  Grape Seed .... 2 Tabs Once Daily 3)  Centrum Silver   Tabs (Multiple Vitamins-Minerals) .... Take 1 Tablet By Mouth Once A Day 4)  Hemocyte 324 Mg  Tabs (Ferrous Fumarate) .... Take 1 Tablet By Mouth Once A Day 5)  Prilosec 20 Mg  Cpdr (Omeprazole) .... Take 1 Capsule By Mouth Once A Day 6)  Nitroquick 0.4 Mg  Subl (Nitroglycerin) .Marland Kitchen.. 1 Under Tongue Every 5 Min X 3 As Needed 7)  Tylenol Extra Strength 500 Mg  Tabs (Acetaminophen) .... Per Bottle 8)  Advair Diskus 250-50 Mcg/dose  Misc (Fluticasone-Salmeterol) .... One Inh Two Times A Day 9)  Fish Oil 1000 Mg Caps (Omega-3 Fatty Acids) .Marland Kitchen.. 1 Daily By Mouth 10)  Vitamin B-12 500 Mcg Tabs (Cyanocobalamin) .... Take One Tablet By Mouth Daily 11)  Biotin 1000 Mcg Tabs (Biotin) .... Take One Tablet Once Daily 12)  Vitamin D 1000 Unit Tabs (Cholecalciferol) .... Take One Tablet Daily 13)  Atenolol 25 Mg Tabs (Atenolol) .Marland Kitchen.. 1 Tab Every Day 14)  Ventolin Hfa 108 (90 Base) Mcg/act Aers (Albuterol Sulfate) .... One Puff By Mouth Every 2-4 Hours As Needed 15)  Oxygen 2.5 Liters  .... Use 2.5 Liters Daily 16)  Potassium Gluconate 595 Mg Tabs (Potassium Gluconate) .... Take One By Mouth Daily 17)  Flexeril 5 Mg Tabs (Cyclobenzaprine Hcl) .... Take One By Mouth Two Times A Day As Needed Muscle Spasm, Sedation Precautions 18)  Vicodin 5-500 Mg Tabs (Hydrocodone-Acetaminophen) .... Take One By Mouth Three Times A Day As Needed Breakthrough Back Pain 19)  Calcium 1500 Mg Tabs (Calcium Carbonate) .... Daily  Allergies: 1)  ! * Symbicort 2)  * Sulfa (Sulfonamides) Group  Past History:  Past medical, surgical, family and social histories (including risk factors) reviewed, and no changes noted (except as noted below).  Past Medical History: Reviewed history from 05/27/2008 and no changes required. Coronary artery disease, s/p PTCA RCA (Dr. Orlena Sheldon) Peripheral Vascular Disease Atrial fibrillation Hypertension Anemia COPD (Emphysemaous with asthmatic component - FEV! 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) GOITER, MULTINODULAR, THYROID (ICD-241.1) Hx esophagitis, gastritis, duodenitis TACHYCARDIA (ICD-785.0) HYPERCHOLESTEROLEMIA, 239/LDL 159 (ICD-272.0) ARTERIOVENOUS MALFORMATION, DUODENUM (ICD-747.60) CARCINOMA, COLON, S/P RESECTION (ICD-153.9) TOBACCO ABUSE, HX OF (ICD-V15.82) REFLUX GASTRITIS (ICD-535.40) ATHEROSCLEROSIS (ICD-440.9) LEG CRAMPS WITH MILD CLAUDICATION (ICD-729.82) SINUS TACHYCARDIA (ICD-427.89) PERIPHERAL VASCULAR DISEASE (ICD-443.9) DIVERTICULOSIS,  COLON (ICD-562.10) COLONIC POLYPS, HX OF (ICD-V12.72) COPD (ICD-496) R hip fracture  Past Surgical History: Reviewed history from 06/02/2010 and no changes required. Colon resection 01/99 (cancer and carcinoid tumor) Abd. U/S- Gallstones 06/99 Ileostomy 01/99---takedown 7/99 Cath, stent/ER ok LE's 10/00 PV aortography-stent 10/00 S/P fem-pop R CA angioplastied 07/00 Hosp.- incarcerated ventral hernia (abd. surg) MRSA  06/28/00 Stent--common iliacs bilat-0816/01 EGD neg 10/20/02 Colonoscopy wnl 10/21/02- hosp., EGD neg Thyroid U/S, goiter- bx 06/26/03 Pinning R hip Sept 2009 Bilateral cataract surg 2004 TAH L Fem neck Fx ORIF 08/03/06 PV W/U Minimal LE Dz (Dr Earnestine Leys) 11/05/07 R Fem neck Fx ORIF (Dr Jerl Santos) 02/20/08 Hosp Compr Fx T4/Kyphoplasty 12/31-06/02/10  Family History: Reviewed history from 12/18/2006 and no changes required. Father: Died at the age of 75 of cancer of the stomach Mother: Died at the age of 77 of kidney failure Siblings: Three brothers and 2 sisters, all deceased, 1 from stroke, 1 from pneumonia with stroke, 1 with cancer of unknown type, and 1 sister with genital cancer  Social History: Reviewed history from 05/27/2008 and no changes required. Marital Status: Widowed Patient states former smoker. Quit 1998  Review of Systems       All systems reviewed.  neg to the above problem except as noted above.  Physical Exam  Additional Exam:  Patient in NAD HEENT:  Normocephalic, atraumatic. EOMI, PERRLA.  Neck: JVP is normal. No thyromegaly.   Lungs: clear to auscultation. No rales no wheezes.  Heart: Regular rate and rhythm. Normal S1, S2. No S3.   No significant murmurs. PMI not displaced.  Abdomen:  Supple, nontender. Normal bowel sounds. No masses. No hepatomegaly.  Extremities:  1+ distal pulses throughout. No lower extremity edema.  Musculoskeletal :moving all extremities.  Neuro:   alert and oriented x3.    Impression & Recommendations:  Problem # 1:  CAD, NATIVE VESSEL (ICD-414.01) No symptoms to suggest angina.  No change in mes.  Problem # 2:  CAROTID STENOSIS (ICD-433.10) Will need continued f/u. Her updated medication list for this problem includes:    Adult Aspirin Low Strength 81 Mg Tbdp (Aspirin) .Marland Kitchen... Take 1 tablet by mouth once a day  Problem # 3:  HYPERTENSION, BENIGN (ICD-401.1) Fair control.  No change. Her updated medication list for this problem  includes:    Adult Aspirin Low Strength 81 Mg Tbdp (Aspirin) .Marland Kitchen... Take 1 tablet by mouth once a day    Atenolol 25 Mg Tabs (Atenolol) .Marland Kitchen... 1 tab every day  Problem # 4:  HYPERLIPIDEMIA-MIXED (ICD-272.4) Not on meds.  Has not toleated.  Discussed Benecol.  Will need to be followed.  Other Orders: EKG w/ Interpretation (93000)  Patient Instructions: 1)  Your physician recommends that you return for a FASTING lipid profile: blood work at [place] office 2)  Benicol margarine spread 2 tablespoons every day 3)  Can get at [employer] 4)  Your physician wants you to follow-up in:  Sept/October 2012  You will receive a reminder letter in the mail two months in advance. If you don't receive a letter, please call our office to schedule the follow-up appointment.   Orders Added: 1)  EKG w/ Interpretation [93000]  Appended Document: 6 month,F6M/PE EKG:  NSR.  62.  RBBB.

## 2010-07-06 NOTE — Assessment & Plan Note (Signed)
Summary: CHECK PLACE ON LEFT ARM,COLD/CLE   Vital Signs:  Patient profile:   77 year old female Height:      63 inches Weight:      147.25 pounds BMI:     26.18 Temp:     98.5 degrees F oral Pulse rate:   76 / minute Pulse rhythm:   regular BP sitting:   148 / 72  (left arm) Cuff size:   regular  Vitals Entered By: Delilah Shan CMA Duncan Dull) (June 30, 2010 3:58 PM) CC: Check place on arm.   2.  Cold   History of Present Illness: Lesion on L arm.  slightly tender to palpation.  Present for 1 week.  No known injury.   No drainage.   ST.  No fevers.  Some rhinorrhea.  No ear pain.  No cough.  Present for a few days.  On advair.   Allergies: 1)  ! * Symbicort 2)  * Sulfa (Sulfonamides) Group  Social History: Marital Status: Widowed Patient states former smoker. Quit 1998 Daughter with colon CA, living at home with patient as of 2/12  Review of Systems       See HPI.  Otherwise negative.    Physical Exam  General:  no apparent distress normocephalic atraumatic tm wnl x2 nasal epithelium with clear rhinorrhea OP with erythema and white patches adherent to soft palate neck supple w/o LA ectopy noted but not tachy clear to auscultation bilaterally L forearm with round  ~1cm lesion- mild erythema on the rim with white center but no pus expressed.  very minimally tender to palpation    Impression & Recommendations:  Problem # 1:  SKIN LESION (ICD-709.9) likely inflammatory lesion that doesn't need I&D.  It isn't increasing in size per patient.  Warm compresses and then follow up if increase in size or more painful.   Problem # 2:  THRUSH (ICD-112.0) RST neg.  Likely thrush.  Start nystatin and keep gargling after advair use.  She understood. follow up as needed.  Orders: Prescription Created Electronically [phone])  Complete Medication List: 1)  Adult Aspirin Low Strength 81 Mg Tbdp (Aspirin) .... Take 1 tablet by mouth once a day 2)  Grape Seed  .... 2 tabs once  daily 3)  Centrum Silver Tabs (Multiple vitamins-minerals) .... Take 1 tablet by mouth once a day 4)  Hemocyte 324 Mg Tabs (Ferrous fumarate) .... Take 1 tablet by mouth once a day 5)  Prilosec 20 Mg Cpdr (Omeprazole) .... Take 1 capsule by mouth once a day 6)  Nitroquick 0.4 Mg Subl (Nitroglycerin) .Marland Kitchen.. 1 under tongue every 5 min x 3 as needed 7)  Tylenol Extra Strength 500 Mg Tabs (Acetaminophen) .... Per bottle 8)  Advair Diskus 250-50 Mcg/dose Misc (Fluticasone-salmeterol) .... One inh two times a day 9)  Fish Oil 1000 Mg Caps (Omega-3 fatty acids) .Marland Kitchen.. 1 daily by mouth 10)  Vitamin B-12 500 Mcg Tabs (Cyanocobalamin) .... Take one tablet by mouth daily 11)  Biotin 1000 Mcg Tabs (Biotin) .... Take one tablet once daily 12)  Vitamin D 1000 Unit Tabs (Cholecalciferol) .... Take one tablet daily 13)  Atenolol 25 Mg Tabs (Atenolol) .Marland Kitchen.. 1 tab every day 14)  Ventolin Hfa 108 (90 Base) Mcg/act Aers (Albuterol sulfate) .... One puff by mouth every 2-4 hours as needed 15)  Oxygen 2.5 Liters  .... Use 2.5 liters daily 16)  Potassium Gluconate 595 Mg Tabs (Potassium gluconate) .... Take one by mouth daily 17)  Flexeril  5 Mg Tabs (Cyclobenzaprine hcl) .... Take one by mouth two times a day as needed muscle spasm, sedation precautions 18)  Vicodin 5-500 Mg Tabs (Hydrocodone-acetaminophen) .... Take one by mouth three times a day as needed breakthrough back pain 19)  Calcium 1500 Mg Tabs (Calcium carbonate) .... Daily 20)  Nystatin 100000 Unit/ml Susp (Nystatin) .... 5ml by mouth qid- swish in the mouth/throat- use until symptoms are resolved and then for 2 more days  Other Orders: Rapid Strep (04540)  Patient Instructions: 1)  Put warm compresses on the spot on your arm.  Let us know if it gets bigger or more tender. 2)  Use the nystatin four times a day until your throat feels better and then for 2 more days. 3)  Gargle after using the inhaler.  4)  Take care.  Prescriptions: NYSTATIN 100000  UNIT/ML SUSP (NYSTATIN) 5ml by mouth qid- swish in the mouth/throat- use until symptoms are resolved and then for 2 more days  #6oz x 1   Entered and Authorized by:   Crawford Givens MD   Signed by:   Crawford Givens MD on 06/30/2010   Method used:   Electronically to        [facility] [address]* (retail)       [address]       [place], [place]  [address]       Ph: [phone]       Fax: [phone]   RxID:   [id number]    Orders Added: 1)  Rapid Strep [72536] 2)  Est. Patient Level III [64403] 3)  Prescription Created Electronically [phone]    Current Allergies (reviewed today): ! * SYMBICORT * SULFA (SULFONAMIDES) GROUP  Laboratory Results  Date/Time Received: June 30, 2010 4:35 PM   Other Tests  Rapid Strep: negative

## 2010-07-14 NOTE — Miscellaneous (Signed)
[facility]: [facility]   Imported By: Kassie Mends 07/06/2010 11:19:05  _____________________________________________________________________  External Attachment:    Type:   Image     Comment:   External Document

## 2010-07-26 ENCOUNTER — Encounter: Payer: Self-pay | Admitting: Family Medicine

## 2010-07-26 LAB — HM MAMMOGRAPHY: HM Mammogram: NORMAL

## 2010-08-08 LAB — CBC
HCT: 43.7 % (ref 36.0–46.0)
HCT: 44.9 % (ref 36.0–46.0)
Hemoglobin: 14.4 g/dL (ref 12.0–15.0)
Hemoglobin: 14.6 g/dL (ref 12.0–15.0)
Hemoglobin: 15.2 g/dL — ABNORMAL HIGH (ref 12.0–15.0)
MCH: 33.9 pg (ref 26.0–34.0)
MCHC: 33.3 g/dL (ref 30.0–36.0)
MCV: 103.1 fL — ABNORMAL HIGH (ref 78.0–100.0)
Platelets: 144 10*3/uL — ABNORMAL LOW (ref 150–400)
RDW: 12.5 % (ref 11.5–15.5)
RDW: 12.8 % (ref 11.5–15.5)
RDW: 12.9 % (ref 11.5–15.5)
WBC: 6.8 10*3/uL (ref 4.0–10.5)
WBC: 8.1 10*3/uL (ref 4.0–10.5)

## 2010-08-08 LAB — BASIC METABOLIC PANEL
BUN: 13 mg/dL (ref 6–23)
BUN: 19 mg/dL (ref 6–23)
BUN: 25 mg/dL — ABNORMAL HIGH (ref 6–23)
CO2: 30 mEq/L (ref 19–32)
Calcium: 9.4 mg/dL (ref 8.4–10.5)
Calcium: 9.7 mg/dL (ref 8.4–10.5)
Chloride: 101 mEq/L (ref 96–112)
Creatinine, Ser: 1.05 mg/dL (ref 0.4–1.2)
GFR calc non Af Amer: 48 mL/min — ABNORMAL LOW (ref >60)
GFR calc non Af Amer: 49 mL/min — ABNORMAL LOW (ref >60)
GFR calc non Af Amer: 51 mL/min — ABNORMAL LOW (ref >60)
Glucose, Bld: 113 mg/dL — ABNORMAL HIGH (ref 70–99)
Potassium: 4.8 mEq/L (ref 3.5–5.1)
Potassium: 4.9 mEq/L (ref 3.5–5.1)
Sodium: 136 mEq/L (ref 135–145)
Sodium: 138 mEq/L (ref 135–145)
Sodium: 139 mEq/L (ref 135–145)

## 2010-08-08 LAB — LIPID PANEL
Total CHOL/HDL Ratio: 3.2 RATIO
Triglycerides: 105 mg/dL (ref <150)
VLDL: 21 mg/dL (ref 0–40)

## 2010-08-08 LAB — DIFFERENTIAL
Basophils Absolute: 0 10*3/uL (ref 0.0–0.1)
Basophils Relative: 0 % (ref 0–1)
Lymphocytes Relative: 18 % (ref 12–46)
Monocytes Absolute: 0.7 10*3/uL (ref 0.1–1.0)
Neutro Abs: 5.8 10*3/uL (ref 1.7–7.7)
Neutrophils Relative %: 72 % (ref 43–77)

## 2010-08-08 LAB — VITAMIN D 1,25 DIHYDROXY
Vitamin D 1, 25 (OH)2 Total: 30 pg/mL (ref 18–72)
Vitamin D2 1, 25 (OH)2: <8 pg/mL
Vitamin D3 1, 25 (OH)2: 30 pg/mL

## 2010-08-08 LAB — PROTIME-INR: Prothrombin Time: 13.5 seconds (ref 11.6–15.2)

## 2010-09-02 LAB — POCT I-STAT, CHEM 8
BUN: 14 mg/dL (ref 6–23)
Calcium, Ion: 1.18 mmol/L (ref 1.12–1.32)
Chloride: 105 mEq/L (ref 96–112)
Creatinine, Ser: 0.8 mg/dL (ref 0.4–1.2)
Glucose, Bld: 126 mg/dL — ABNORMAL HIGH (ref 70–99)
HCT: 45 % (ref 36.0–46.0)
Hemoglobin: 15.3 g/dL — ABNORMAL HIGH (ref 12.0–15.0)
Potassium: 4.4 mEq/L (ref 3.5–5.1)
Sodium: 139 mEq/L (ref 135–145)
TCO2: 27 mmol/L (ref 0–100)

## 2010-09-02 LAB — POCT CARDIAC MARKERS
CKMB, poc: 2 ng/mL (ref 1.0–8.0)
Myoglobin, poc: 95.8 ng/mL (ref 12–200)
Myoglobin, poc: 98 ng/mL (ref 12–200)
Troponin i, poc: <0.05 ng/mL (ref 0.00–0.09)

## 2010-10-11 NOTE — Assessment & Plan Note (Signed)
[facility]                            CARDIOLOGY OFFICE NOTE   Abigail, [name]                        MRN:          [id number]  DATE:09/16/2007                            DOB:          [birth year]    IDENTIFICATION:  Abigail [name] is an 77 year old woman who I have followed in  Cardiology Clinic.  She is also followed by Francella Solian in Pulmonary.  She  has a history of CAD, CV disease, PAD, atrial fibrillation,  dyslipidemia.  I last saw her back in October.   When I saw her, she was complaining of shortness of breath and I  actually sent her back to [facility].  She spent a significant time with  him with her and working with her inhalers and has had an improvement.  She says she is able to do more now without giving out.   She denies chest pain.  She still gets intermittent cramping in her legs  and hands.  She used to take quinine for this.  She did not tolerate  Crestor because aching and has brought it back.   CURRENT MEDICINES:  1. Iron 54.  2. Aspirin 81.  3. Atenolol 25.  4. Foltx.  5. Plavix 75.  6. Centrum Silver.  7. Grapeseed.  8. Prilosec 20.  9. Garlic 1 gram.  10.Symbicort 160/4.5 two puffs per day.   PHYSICAL EXAMINATION:  GENERAL:  The patient is in no distress.  She is  hard of hearing.  VITAL SIGNS:  Blood pressure is 148/63, pulse is 71, weight 157.  LUNGS:  Clear, no wheezes.  CARDIAC:  Regular rate and rhythm, S1-S2.  No S3, no murmurs.  ABDOMEN:  Benign.  EXTREMITIES:  Pulses 2+.  No edema.  Right dorsalis pedis 1+.  Chronic  skin discoloration with evidence of bruising.   STUDIES:  A 12-lead EKG sinus rhythm, 66 beats per minute, right bundle  branch block.  Left anterior fascicular block, occasional PAC,  unchanged.   IMPRESSION:  1. CAD.  Patient with known disease.  Last catheterization 2002,      nonobstructive at the time.  Cardiolite in 2005, normal perfusion,      EF 72%.  Would continue on medical therapy.  2. Dyslipidemia has not tolerated statins.  She has been on      lovastatin, Lipitor, Crestor, Pravachol without success.  I have      reviewed with Shelby Dubin.  Would try Zetia.  3. Chronic obstructive pulmonary disease/asthma.  Followed in      pulmonary.  4. Hypertension.  I am going to ask the patient to check her blood      pressures at home.  A little bit high today.  Will not change her      medicines for now.  5. Atrial fibrillation.  Not Coumadin.  Continue aspirin, Plavix.  6. Anemia.  Followed by Dr. Hetty Ely.  7. CV disease for follow-up carotid scan in June of this year.  8. Leg cramping, can try tonic water.  Followed by [facility].  Status post  fem-pop.   I will set follow-up in the winter.  She will have lipids done in two  months.     Pricilla Riffle, MD, [facility]  Electronically Signed    PVR/MedQ  DD: 09/16/2007  DT: 09/16/2007  Job #: [id number]

## 2010-10-11 NOTE — Assessment & Plan Note (Signed)
[facility]                            CARDIOLOGY OFFICE NOTE   Abigail [name], Abigail [name]                        MRN:          [id number]  DATE:03/04/2007                            DOB:          April 25, 1920    IDENTIFICATION:  Abigail [name] is a soon-to-be 77 year old woman (October [birth year]).  She has a history of CAD, CV disease, PAD, atrial fibrillation, and  dyslipidemia.  She was last seen in the cardiology clinic back in June.   Since seen, she denies chest pain.  She does get short of breath with  activity.  Note, she has a history of asthma.  She has a breathing  machine that seems to help some.  She says if she was not short of  breath, she would be feeling great.  She thinks things are getting worse  some.   CURRENT MEDICATIONS:  1. Iron 54 mg.  2. Protonix 40.  3. Aspirin 81.  4. Atenolol 25.  5. Foltx.  6. Plavix 75 (history of CVA).  7. Centrum Silver.  8. Vitamin C.  9. Advair 100/50 b.i.d.  10.Grape seed 2 tabs.   REVIEW OF SYSTEMS:  The patient did not tolerate lovastatin.   PHYSICAL EXAM:  On exam, the patient is not in any distress.  Blood pressure 130/62, pulse 65 and regular, weight 155.  LUNGS:  Relatively clear.  CARDIAC:  Regular rate and rhythm.  S1, S2.  No significant murmurs.  ABDOMEN:  Benign.  EXTREMITIES:  No edema.   A 12-lead EKG shows sinus rhythm at 67 beats per minute, right bundle  branch block, left anterior fascicular block, unchanged.   IMPRESSION:  1. Coronary artery disease.  I am not convinced the shortness of      breath is coronary ischemia.  I would like to re-refer the patient      back to Dr. Francella Solian.  Note, she had a Myoview that was negative      in 2005.  Continue on her medical therapy for now.  2. Shortness of breath.  As noted above.  3. History of dyslipidemia.  I have encouraged her to try Crestor 5.      If she has problems with this, she should call.  Let us know either      way.  If she does  not tolerate, would try Lopid.  4. History of atrial fibrillation.  Aspirin therapy, not Coumadin.  5. Hypertension.  Adequate control.  6. Cardiovascular disease.  On last visit, recommended carotid.  This      was done.  Stable with disease bilaterally with a chronically      occluded right internal carotid artery.  Left internal carotid      artery with 60-79% stenosis.  Recommend followup in 1 year.  7. History of anemia, followed in gastroenterology.  8. History of leg cramping.  She does not complain of this today.      Lower extremity venous Dopplers were sent from [facility].   I will set followup for later in the winter.  Again, pulmonary referral  as noted.     Pricilla Riffle, MD, [facility]  Electronically Signed    PVR/MedQ  DD: 03/04/2007  DT: 03/05/2007  Job #: [id number]

## 2010-10-11 NOTE — Letter (Signed)
September 10, 2007    Fax:  [phone]   RE:  TSUYAKO, JOLLEY  MRN:  [id number]  /  DOB:  [birth year]   [place]:   I have a request for certification for refill for Symbicort for Ms.  Jimmie Molly.   This patient has already been tried on multiple bronchodilators  including Spiriva and Advair and had a dramatic improvement while on  Symbicort 160/4.5 two puffs b.i.d.  Another option to consider if she  cannot be reimbursed for Symbicort would be to supply her with a  nebulizer with the same ingredients, namely budesonide 0.5 mcg and  Brovana 15 mcg b.i.d.  I think it is simpler and quicker to use HFA,  which translates into better compliance and less healthcare costs  overall,  and she has been done well with it historically, so I would  strongly recommend it be reimbursed over or switching her to more  expensive nebulizer solution.    Sincerely,      Casimiro Needle B. Sherene Sires, MD, [facility]  Electronically Signed    MBW/MedQ  DD: 09/10/2007  DT: 09/10/2007  Job #: [id number]

## 2010-10-11 NOTE — Assessment & Plan Note (Signed)
HEALTHCARE                             PULMONARY OFFICE NOTE   Abigail [name], Abigail [name]                        MRN:          [id number]  DATE:03/25/2007                            DOB:          01-13-1920    REASON FOR CONSULTATION:  Dyspnea.   HISTORY:  An 77 year old white female whom I have previously seen here  in 2003 with evidence of chronic obstructive pulmonary disease with an  asthmatic component (18% improvement after bronchodilators, giving her a  post bronchodilator FEV1 of 54% predicted in a study I reviewed dated  April 02, 2002).   She had been doing well on Advair, but had complained of both the cough  and irritation of the upper airway, and had reduced the dose to 1 puff  q.a.m.  She is frustrated on this dose.  She is losing ground over the  last several  years to the point where it is difficult for her to go  dancing on weekends, and also has trouble getting uphill from the garden  to the house.  Her sense is that gradually things are worsening.  Despite this, she denies any associated exertional chest pain or  worsening cough, fevers, chills, sweats, subjective cough, orthopnea,  PND, but has noticed slight increase in leg swelling chronically.  She  denies any obvious weather or environmental triggers.   PAST MEDICAL HISTORY:  1. Hypertension.  2. Colon cancer.  3. Allergies.  4. Remote stent.  5. Hysterectomy.  6. Colon surgery.   ALLERGIES:  She cannot tolerate SULFA DRUGS because of hives.   MEDICATIONS:  Taken in detail on the worksheet, column dated March 25, 2007.  Note that she does use proton pump inhibitor therapy, but takes  it at breakfast or after breakfast.   SOCIAL HISTORY:  She quit smoking in 1999, is retired.   FAMILY HISTORY:  Positive for atopy but negative for significant  emphysema or asthma.   REVIEW OF SYSTEMS:  Taken in detail on the worksheet, negative except as  outlined above.   PHYSICAL  EXAMINATION:  This is a chronically ill white female,  exceptionally hard of hearing without her hearing aids in place.  She is afebrile, normal vital signs.  HEENT:  Remarkably unremarkable.  Oropharynx is clear.  Nasal  turbinates, normal ear canals clear bilaterally.  NECK:  Supple without cervical adenopathy or tenderness, trachea is  midline, no thyromegaly.  Lung fields revealed diminished breath sounds with no wheeze.  There was  a positive Hoover's sign earlier on inspiration.  HEART:  Regular rate and rhythm without murmurs, gallops, or rubs.  ABDOMEN:  Obese but otherwise benign.  EXTREMITIES:  Warm without calf tenderness, cyanosis or clubbing.  There  was 1 to 2+ pitting edema bilaterally.  NEUROLOGIC:  No focal deficit or pathologic reflexes.  SKIN EXAM:  Warm and dry.   Chest x-Vicario is pending.   Lab data included a normal CBC, a Bicarb level of 34 (corresponding to a  PCO2 at steady state of around 55).  TSH of 1.17, and an FEV1 of  34%.   IMPRESSION:  1. Severe chronic obstructive pulmonary disease by previous pulmonary      function tests with a definite asthmatic component that probably      could be treated more aggressively if she could tolerate the      effects of the Advair on the upper airway or the cost.  If not, she      would be a good candidate for nebulized budesonide and Brovana in      combination.  Since she already has the Advair, and her technique      was so poor, I recommended first teaching her optimal dry powder      inhaler technique, and asked her to take 1 puff perfectly regularly      twice a day, rather than her 1 puff q.a.m. dose to see to what      extent she improves.  2. Her peripheral edema is unexplained by left heart failure, but she      very well could have a component a right heart failure and/or      nocturnal hypoxemia causing a component of right heart failure.      Note that she does have elevated Bicarb level consistent with       chronic hypercarbic respiratory failure which would be consistent      with cor pulmonale.  For now, I have recommended she add Maxzide-25      one half q.a.m., and try decreasing atenolol down to 25 mg 1/2      daily to see if there is any benefit in terms of breathing, noting      that her blood pressure should respond to the diuretic and allow      lower doses of atenolol which would give less adverse beta 2      effect.  3. Followup will be in 4 weeks with pulmonary function tests to      compare to previous studies.     Charlaine Dalton. Sherene Sires, MD, [facility]  Electronically Signed    MBW/MedQ  DD: 03/25/2007  DT: 03/26/2007  Job #: [id number]   cc:   Pricilla Riffle, MD, [facility]  Arta Silence, MD

## 2010-10-11 NOTE — Assessment & Plan Note (Signed)
[facility]                            CARDIOLOGY OFFICE NOTE   RITAL, CAVEY                        MRN:          [id number]  DATE:11/01/2006                            DOB:          1919/08/19    IDENTIFICATION:  [name] is a 77 year old woman who I follow in  cardiology clinic. She was last seen actually back in September. She has  a history of coronary artery disease, CV disease, peripheral vascular  disease, atrial fibrillation, and dyslipidemia.   Since seen, she notes some cramping in her legs. She notes that she is  followed at [facility]. She has not tolerated statins in the past and would  like to try Lovastatin. Her son is on this.   She denies chest pain. Breathing is relatively unchanged.   CURRENT MEDICATIONS:  1. Iron.  2. Protonix 40 mg.  3. Aspirin 81 mg.  4. Atenolol 25 mg.  5. Foltx daily.  6. Plavix 75 mg daily.  7. Centrum Silver.  8. Vitamin C.  9. Advair.  10.Grapeseed 2 tablets.   PHYSICAL EXAMINATION:  GENERAL:  The patient is in no distress.  VITAL SIGNS:  Blood pressure 159/70, pulse 52 and regular, weight 149.  LUNGS:  Clear.  CARDIAC:  Regular rate and rhythm, S1, S2, no S3.  ABDOMEN:  Benign.  EXTREMITIES:  No edema.   IMPRESSION:  1. Coronary artery disease appears to be stable. Status post last      catheterization in 2002, non-obstructive disease. Prior to that,      she had a stent to the RCA. Myoview scan in 2005 was normal. No      change in clinical.  2. CV disease. We will repeat ultrasound.  3. Dyslipidemia. Willing to try statin now. We will go ahead and add      Lovastatin and follow lipids in 8 weeks.  4. Hypertension. I told her to take Atenolol in the a.m., check blood      pressure at home, and we will follow up on this.  5. History of atrial fibrillation; aspirin.  6. Anemia. Heme positive, followed in GI. Hemoglobin 12.  7. Low extremity cramping. I will check work up at [employer]. Note, 1+     posterior tibial pulses. I will be in touch with the patient.   ADDENDUM   12 lead EKG sinus bradycardia, 53 beats-per-minute, right bundle branch  block. Rate slightly slower than the last time. We will arrange to have  her set up for a 24 hour event monitor.     Pricilla Riffle, MD, [facility]  Electronically Signed    PVR/MedQ  DD: 11/01/2006  DT: 11/02/2006  Job #: [id number]

## 2010-10-11 NOTE — Op Note (Signed)
Abigail [name], Abigail [name]                 ACCOUNT NO.:  [id number]   MEDICAL RECORD NO.:  [id number]          PATIENT TYPE:  INP   LOCATION:  5018                         FACILITY:  [facility]   PHYSICIAN:  Abigail Basque. Dalldorf, M.D.DATE OF BIRTH:  06-09-1919   DATE OF PROCEDURE:  02/20/2008  DATE OF DISCHARGE:                               OPERATIVE REPORT   PREOPERATIVE DIAGNOSIS:  Right femoral neck fracture.   POSTOPERATIVE DIAGNOSIS:  Right femoral neck fracture.   PROCEDURE:  Percutaneous pinning, right femoral neck fracture.   ANESTHESIA:  General.   ATTENDING SURGEON:  Abigail Basque. Jerl Santos, MD   ASSISTANT:  Lindwood Qua, PA   INDICATIONS FOR PROCEDURE:  The patient is an 77 year old woman with a  couple of months of right hip pain.  This has persisted despite rest and  wheelchair status.  She persists with pain when attempting to bear  weight.  X-rays were basically normal, but we got an MRI scan which has  shown a nondisplaced femoral neck fracture.  She is actually status post  a similar issue on the opposite hip which responded well to percutaneous  pinning and is offered the same procedure on this right side.  Informed  operative consent was obtained after discussion of possible  complications of reaction to anesthesia, infection, and nonunion.   SUMMARY/FINDINGS/PROCEDURE:  Under general anesthesia through a very  small incision, a percutaneous pinning was performed on the right hip.  We placed 2 Ace cannulated screws over guidewires.  I used fluoroscopy  throughout the case to make appropriate intraoperative decisions and  read all these views myself.   DESCRIPTION OF PROCEDURE:  The patient was taken to the operating suite  where general anesthetic was applied without difficulty.  She was  positioned supine on the fracture table and positioned appropriately.  She was prepped and draped in normal sterile fashion.  We used  fluoroscopy to find the appropriate site to make a  small 1-inch  incision.  Dissection was carried down through IT band to the flare of  the femur.  We placed 2 guidewires in this area up into the femoral  head.  These were seen to be central by fluoroscopy and 2 views.  We  then placed 2 Ace cannulated screws across this area with the short  threads.  One was 85 mm in length and the other was 95 mm in length.  Fluoroscopy was used to confirm adequate placement of hardware.  The  wound was irrigated followed by reapproximation of the IT band with #1  Vicryl and subcutaneous tissues with 2-0 undyed Vicryl.  The skin was  closed with staples followed by a Mepilex dressing.  Estimated blood  loss and intraoperative fluids can be obtained from anesthesia records.   DISPOSITION:  The patient was extubated in the operating room and taken  to the recovery room in stable addition.  She is to be admitted at least  for overnight observation with probable discharge to home in the  morning.      Abigail Basque Jerl [name], M.D.  Electronically Signed  PGD/MEDQ  D:  02/20/2008  T:  02/21/2008  Job:  [id number]

## 2010-10-11 NOTE — Assessment & Plan Note (Signed)
[facility]                             PULMONARY OFFICE NOTE   ZARIYA, MINNER                        MRN:          [id number]  DATE:04/23/2007                            DOB:          04-24-1920    HISTORY:  This is a delightful 77 year old white female seen on October  27 for evaluation of dyspnea with cough with an FEV1 then of only 34%  predicted recorded and a bicarb level of 34 corresponding to a PCO2 at a  steady state of around 55.  I felt she had severe COPD but wondered if  she did not have an asthmatic component and recommended that she  continue Advair at 100/50 b.i.d. but spent extra time teaching her  optimal technique and she returns here for followup feeling quite a bit  better.  She says that previously she could not dance at all and now  can dance but not as long as she wants to because of dyspnea.  She  denies any variability with weather or environmental change or obvious  triggering or alleviating factors.  She has also had marked improvement  in leg swelling.   For full inventory of medications, please see face sheet column dated  April 23, 2007.   PHYSICAL EXAMINATION:  She is a pleasant, ambulatory white female in no  acute distress.  She is afebrile, normal vital signs.  HEENT:  Unremarkable, oropharynx clear.  LUNGS:  Fields clear bilaterally, reveal diminished breath sounds  bilaterally, no wheezing.  Regular rhythm without murmur, gallop, rub.  ABDOMEN:  Soft, benign.  EXTREMITIES:  Warm without calf tenderness, cyanosis, clubbing, edema.   PFTs were improved and to my surprise she has improved FEV1 from her  previous reported PFTs up to 72% predicted with a ratio of 47%.  Diffusion capacity however is only 40%.   IMPRESSION:  This patient clearly has emphysematous chronic obstructive  pulmonary disease but there is enough of an asthma component to get  benefit from Advair.  This may have leveled her out in  terms of her  functional status but has not improved to the point where she can go  out dancing all day.  I discussed several options with the patient.  One is to increase the Advair to a higher dose.  Based on a relatively  flat dose response to Advair though I think this is unlikely to work.  The other is to add Spiriva based on the fact that she has evidence of  air-trapping on pulmonary function tests with diffusion capacity of only  40% indicating a significant emphysematous component that may be  improved by the addition of Spiriva.   However, one of the things she complained about bitterly was taking so  many medicines.  To help her with this I am going to give her Spiriva  for full 30 days and ask her to return for full medication  reconciliation and if there is benefit from Spiriva actually may  consider dropping off the Advair (the reverse of her therapeutic trials)  since I am not really convinced  there is that  much asthma present clinically despite her improvement from 34% up to  70% predicted, which is most likely an erroneous baseline.     Charlaine Dalton. Sherene Sires, MD, [facility]  Electronically Signed    MBW/MedQ  DD: 04/24/2007  DT: 04/24/2007  Job #: [id number]   cc:   Pricilla Riffle, MD, [facility]

## 2010-10-11 NOTE — Discharge Summary (Signed)
NAMEMARCELLE, Abigail [name]                 ACCOUNT NO.:  [id number]   MEDICAL RECORD NO.:  [id number]          PATIENT TYPE:  INP   LOCATION:  5018                         FACILITY:  [facility]   PHYSICIAN:  Lubertha Basque. Dalldorf, M.D.DATE OF BIRTH:  March 19, 1920   DATE OF ADMISSION:  02/20/2008  DATE OF DISCHARGE:  02/21/2008                               DISCHARGE SUMMARY   ADMITTING DIAGNOSES:  1. Right hip stress fracture, nondisplaced.  2. History of left hip fracture pinned.  3. Coronary artery disease.  4. Dyslipidemia.  5. Chronic obstructive pulmonary disease.  6. Hypertension.  7. Atrial fibrillation.  8. Anemia.   DISCHARGE DIAGNOSES:  1. Right hip stress fracture, nondisplaced.  2. History of left hip fracture .  3. Coronary artery disease.  4. Dyslipidemia.  5. Chronic obstructive pulmonary disease.  6. Hypertension.  7. Atrial fibrillation.  8. Anemia.   OPERATIONS:  Pinning of right hip stress fracture, nondisplaced.   BRIEF HISTORY:  [name] is a very nice lady patient well-known to our  practice who about a year and a half ago had a left hip stress fracture  that was pinned.  She has done well.  She is very active, likes to dance  and over the last couple of weeks to a month, she has been having  increasing right hip pain.  We have done a bone scan, it was negative.  X-rays were apparent that there was no fracture.  Bone scan did show a  nondisplaced breast fracture.  I discussed treatment options with the  patient that being hip pinning.   PERTINENT LABORATORY DATA AND X-Celestine FINDINGS:  Hemoglobin 12.3, wbc's  10.5, and platelets 135.  Sodium 138, potassium 4.0, BUN 14, creatinine  0.97, glucose 130.   COURSE IN THE HOSPITAL:  She was admitted postoperatively, placed on the  variety of p.o. and IM analgesics for pain, kept on her home medicines  which are outlined at this dictation and taken to the operating room at  which time a percutaneous pinning of her right hip was  done.  Postoperatively, she was kept on a variety of p.o. and IM analgesics for  pain, IV Ancef 1 g q.8 h. x3 doses.  Physical therapy ordered for  touchdown partial weightbearing and then wound was noted to be benign  with some infection and irritation.  Vital signs stable.  Calf soft and  nontender and she worked with physical therapy partial weightbearing and  was discharged home.  Condition on discharge is improved.   FOLLOWUP:  She will be on a low-sodium heart-healthy diet.  I changed  her dressing daily, touchdown weightbearing crutches or walker.   She was given a prescription for Percocet 5/325 one or two q.4-6 h.  p.r.n. pain, kept on her home medicines, which are atenolol 25 mg one a  day, folic acid 2.5/25/2 mg daily, aspirin 81 mg daily, vitamin D 1000  units daily, Plavix 75 one a day, Hemocyte 324 mg, grapeseed, fish oil,  Advair 250/50 one puff twice a day, and Prilosec once a day.  Any sign  of infection or irritation, call our office at [phone] after discharge,  otherwise, we will see her in 7 to 10 days for a recheck.      Lindwood Qua, P.A.      Lubertha Basque Jerl Santos, M.D.  Electronically Signed    MC/MEDQ  D:  02/21/2008  T:  02/21/2008  Job:  [id number]

## 2010-10-11 NOTE — Assessment & Plan Note (Signed)
[facility]                            CARDIOLOGY OFFICE NOTE   Abigail, [name]                        MRN:          [id number]  DATE:03/30/2008                            DOB:          June 21, 1919    IDENTIFICATION:  Abigail [name] is an 76 year old woman who I follow.  She has  a history of CAD, cerebrovascular disease, peripheral artery disease,  dyslipidemia, atrial fibrillation, and COPD.  I last saw her in April.   In the interval, unfortunately she has undergone thinning for 2  bilateral hip fractures by Dr. Jerl Santos.  She still in quite a bit of  pain with this and has limited her activity.   Otherwise, her breathing has been okay.  She denies chest pain.  She  tried Zetia, but could not tolerate it.  She is not tolerant to other  statins.   CURRENT MEDICINES:  1. Iron 54.  2. Aspirin 81.  3. Atenolol 25.  4. Foltx.  5. Plavix 75.  6. Centrum Silver.  7. Grapeseed oil.  8. Prilosec.  9. Garlic.  10.Symbicort.  11.Advair.  12.Fish oil.   PHYSICAL EXAMINATION:  GENERAL:  The patient is in no distress, at rest.  VITAL SIGNS:  Blood pressure 117/66, pulse is 63 and regular, weight  154.  NECK:  JVP is normal.  LUNGS:  Clear without wheezes or rales.  CARDIAC:  Regular rate and rhythm.  S1 and S2.  No S3.  No significant  murmurs.  ABDOMEN:  Benign.  EXTREMITIES:  No edema.   A 12-lead EKG normal sinus rhythm, 63 beats per minute.  Right bundle-  branch block.   IMPRESSION:  1. Coronary artery disease.  Last catheterization 2002, nonobstructive      Cardiolite in 2005, normal perfusion.  Continue medical therapy.  2. Dyslipidemia.  Does not tolerate statins or Pravachol.  Last lipid      panel, was not too bad with an LDL of 116, HDL of 56, would follow,      watch diet.  3. Cardiovascular disease.  We will set up for an ultrasound, which      she needs.  4. Hypertension, good control.  5. Chronic obstructive pulmonary disease  followed in Pulmonary by      Sandrea Hughs.  6. Atrial fibrillation, not on Coumadin.  Continue aspirin and Plavix.  7. Anemia followed by Dr. Hetty Ely.  8. Orthopedic due to follow up with Dr. Jerl Santos.   I will set to see her back in 6 months.  Note, she is complaining of  hair loss in the back of skull.  This is new for her.  She has been on  the atenolol for a while.  I am not convinced it is related to that.     Pricilla Riffle, MD, [facility]  Electronically Signed    PVR/MedQ  DD: 03/30/2008  DT: 03/31/2008  Job #: [id number]   cc:   Arta Silence, MD  Charlaine Dalton. Sherene Sires, MD, [facility]  Lubertha Basque. Jerl Santos, M.D.

## 2010-10-14 NOTE — H&P (Signed)
[facility]  Patient:    Abigail [name], Abigail [name] Visit Number: [id number] MRN: [id number]          Service Type: MED Location: 3W 0380 01 Attending Physician:  Veneda Melter Dictated by:   Abelino Derrick, P.A.C. [facility] Adm. Date:  01/19/2001                           History and Physical  CHIEF COMPLAINT:  Chest pain.  HISTORY OF PRESENT ILLNESS:  Ms. Zelaya is a 77 year old female followed by Dr. Hetty Ely and recently by Dr. Dietrich Pates.  She has a history of peripheral vascular disease and coronary disease.  She had a stent by Dr. Juliann Pares in July 2000.  Recently, because of an insurance change, she was evaluated by Dr. Tenny Craw.  The patient says she had a stress test done in the office that was "okay."  She did have some back discomfort and sharp chest pain on the treadmill, but she said Dr. Tenny Craw told her the scan was negative.  She was admitted through the emergency room this morning with epigastric "stinging" and "burning" substernal chest pain.  Symptoms started about 4 a.m.  She took two nitroglycerin at home with some relief.  She continued to have some vague stinging in her chest at this point.  Her EKG in the emergency room showed sinus rhythm with right bundle branch block.  In the past she has had evidence of incomplete right bundle branch block, but this is felt to be a new change. She is admitted now for further evaluation.  PAST MEDICAL HISTORY: 1. Peripheral vascular disease. 2. She has had previous iliac stenting by Dr. Orson Slick and Dr. Hart Rochester. 3. History of colon cancer and had a right colectomy in January 1999. 4. She was reoperated on in February 1999, for small-bowel obstruction.    Ileostomy was placed and later closed that some year. 5. She has had problems with abdominal wall abscess and a ventral hernia, and    this was repaired, according to the patient, in March 2002. 6. Status post remote hysterectomy. 7. No history of diabetes or  hypertension. 8. She does have a history of high cholesterol but was intolerant to Baycol    in the past, which has caused myalgias and neuropathy.  CURRENT MEDICATIONS:  Prilosec p.r.n., aspirin, and vitamins.  ALLERGIES:  SULFA allergy.  BAYCOL intolerance.  SOCIAL HISTORY:  She lives with her husband.  Is a nonsmoker.  FAMILY HISTORY:  One brother has a pacemaker, one brother had bypass in his 55s.  Her father died at 33 of stomach cancer.  REVIEW OF SYSTEMS:  Essentially unremarkable except as noted above.  There is no history of GI bleeding or peptic ulcer disease.  She has had UTIs in the past.  PHYSICAL EXAMINATION:  VITAL SIGNS:  Blood pressure 117/50, pulse 100, respirations 18.  GENERAL:  Elderly female in no acute distress.  HEENT:  Normocephalic.  Extraocular movements are intact.  Sclerae nonicteric. Lids and conjunctivae within normal limits.  NECK:  Left carotid artery bruit.  CHEST:  Clear to auscultation and percussion.  CARDIAC:  Regular rate and rhythm, with frequent extra systoles and a soft systolic murmur at the left sternal border, preserved S2.  ABDOMEN:  Midline surgical scar.  Nontender.  No obvious hepatosplenomegaly. Bowel sounds are present.  EXTREMITIES:  No edema.  Distal pulses are somewhat diminished on the left compared to the  right.  She does have bifemoral bruits.  NEUROLOGIC:  Grossly intact.  She is awake, alert, oriented, and cooperative. Moves all extremities without obvious deficit.  LABORATORY DATA:  EKG shows sinus rhythm, PACs, and right bundle branch block.  Initial CK was negative.  Sodium 137, potassium 4.0, BUN 14, creatinine 0.9. Other laboratories are pending.  IMPRESSION: 1. Chest pain, with known coronary disease and widespread vascular disease,    rule out MI. 2. Coronary disease, status post coronary stenting July 2000, by Dr. Juliann Pares,    details pending. 3. Peripheral vascular disease with previous  intervention. 4. 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 5. Sulfa allergy. 6. Baycol intolerance.  PLAN:  The patient will be admitted to telemetry.  CK-MBs and troponins will be obtained.  She will be started on Lovenox.  She may need angiogram. Dictated by:   Abelino Derrick, P.A.C. [facility] Attending Physician:  Veneda Melter DD:  01/19/01 TD:  01/20/01 Job: [id number]

## 2010-10-14 NOTE — Discharge Summary (Signed)
Abigail [name], Abigail [name]                           ACCOUNT NO.:  [id number]   MEDICAL RECORD NO.:  [id number]                   PATIENT TYPE:  INP   LOCATION:  0466                                 FACILITY:  [facility]   PHYSICIAN:  Rene Paci, M.D. [facility]          DATE OF BIRTH:  09-02-1919   DATE OF ADMISSION:  10/19/2002  DATE OF DISCHARGE:  10/21/2002                                 DISCHARGE SUMMARY   DISCHARGE DIAGNOSES:  1. 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  Discharge hemoglobin 10.1.  2. Coronary artery disease, status post percutaneous transluminal coronary     angioplasty.  3. Peripheral vascular disease, status post femoral/popliteal, left.  4. History of colon cancer, status post hemiresection.  5. History of hypertension.  6. History of dyslipidemia.  7. History of ventral hernia.   DISCHARGE MEDICATIONS:  The discharge medications are as prior to admission  and include:  1. Aspirin 81 mg p.o. q.d.  2. Plavix 75 mg p.o. q.d.  3. Lipitor 10 mg p.o. q.d.  4. Atenolol 25 mg 1/2 tablet p.o. q.d.  5. Protonix 40 mg p.o. q.d.  6. Multivitamin with iron daily.   CONDITION ON DISCHARGE:  Stable.   DISPOSITION:  Patient is discharged to home.   FOLLOW UP:  1. She is followed up through her GI physician, Dr. Victorino Dike, scheduled     for June 9 at 11:15 a.m.  2. She is also to contact her cardiologist, Dr. Pricilla Riffle and her     primary care physician, Dr. Molly Maduro __________ for routine health     maintenance followups as previously scheduled.  3. Consideration will be given to an outpatient capsule study to evaluate     possible small bowel sources of AVM blood loss at a future date should     this recurrent anemia persist.   HOSPITAL COURSE:  PROBLEM #1:  Anemia, secondary to GI bleed.  Patient  presented to the emergency room, day of admission, secondary to 24 hours of  black stools.  She had no evidence of any  hemodynamic compromise due to this  blood loss, was normotensive and not presyncopal.  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  She was  admitted for observation and GI workup.  She was typed and crossed 2 units,  but none were provided as her hemoglobin remained overall stable following  H&H every 8 hours.  GI was consulted and performed an EGD the day after  admission, which was negative for any source of acute blood loss.  She was  also scheduled for a colonoscopy, especially given her history of previous  AVMs and colon cancer.  This was performed on May 25 and showed no  significant source of blood loss or other abnormalities.  As her  hemodynamics remained stable, melena resolved, and  hemoglobin was stable,  patient was discharged home without further evaluation or workup.  As stated  above, she will be considered for outpatient capsule study for small bowel  evaluation by her usual GI physician, Dr. Victorino Dike, should blood loss  recur.  She will continue her multivitamin with iron as prior to admission.  She was tolerating a regular diet without difficulty.  After discussion with  GI physician, it is deemed that it is acceptable risk versus benefit profile  for patient to continue her aspirin and Plavix for her peripheral vascular  disease in light of recurrent GI bleeding and anemia.  Discharge hemoglobin  10.1.  PROBLEM #2:  Other chronic medical issues.  Plavix and aspirin were held  during her two days of hospitalization for GI procedures and possible  intervention.  These will be resumed on day after discharge as discussed  above.  Otherwise, patient was continued on all of her previous medications  as prior to admission, and no other changes were made in this regimen.                                               Rene Paci, M.D. [facility]    VL/MEDQ  D:  10/21/2002  T:  10/21/2002  Job:  [id number]

## 2010-10-14 NOTE — Discharge Summary (Signed)
[facility]  Patient:    Abigail [name], Abigail [name] Visit Number: [id number] MRN: [id number]          Service Type: MED Location: 3W [id number] 01 Attending Physician:  Veneda Melter Dictated by:   Lavella Hammock, P.A. Adm. Date:  01/19/2001 Disc. Date: 01/21/2001   CC:         Laurita Quint, M.D. [facility]   Referring Physician Discharge [place]  DATE OF BIRTH:  Apr 23, 1920  PROCEDURES: 1. Cardiac catheterization. 2. Coronary arteriogram. 3. Left ventriculogram.  HOSPITAL COURSE:  Abigail [name] is a 77 year old female with a history of coronary artery disease and a stent to her RCA in July 2000 by Dr. Juliann Pares.  She was seen by Dr. Tenny Craw in the office to be established and had a stress Cardiolite that showed no scar, no ischemia.  She described substernal chest pain that felt like a burning or stinging and started at 4 a.m. on the day of admission. She received partial relief with nitroglycerin in the emergency room.  Her EKG showed new right bundle-branch block which was different from a previous EKG done in January 2002.  She was admitted to rule out MI and for further evaluation.  Her enzymes were negative for MI and she was scheduled for a cardiac catheterization which was done on January 21, 2001.  The cardiac catheterization showed a normal left main with a distal 30% stenosis and an LAD with a 30-40% proximal stenosis.  D-2 and D-3 each had 50% ostial stenoses.  The circumflex had a large OM-1 and it had a 60% ostial stenosis in the circumflex itself.  There was 30% mid in-stent restenosis in the RCA and otherwise the RCA was disease free.  Her EF was greater than 65%.  It was felt that there was possibly a GI etiology to her symptoms and with a recent negative Cardiolite and with a cardiac catheterization showing no critical coronary artery disease, her symptoms were not secondary to her coronary artery status.  The patients bedrest is pending completion at  this time, but if her groin is stable and she is ambulatory without pain after her bedrest is complete she is to be discharged on January 21, 2001.  DISCHARGE INSTRUCTIONS:  ACTIVITY:  Her activity level is to include no driving, sexual, or strenuous activity for two days.  DIET:  She is to stick to a low fat diet.  WOUND CARE:  She is to call the office for problems at the catheterization site.  FOLLOW-UP:  She is to follow up with Dr. Hetty Ely on a p.r.n. basis.  She is to follow up with Dr. Tenny Craw on Monday, September 9 at 3:30 p.m.  This is a Building services engineer. appointment.  DISCHARGE MEDICATIONS:  1. Prilosec 20 mg q.d.  2. Aspirin 325 mg q.d.  PROBLEM LIST:  1. Coronary artery disease status post percutaneous transluminal coronary     angioplasty and stent to the right coronary artery in July 2000.  2. Stress Cardiolite on January 10, 2001 showing no scar, no ischemia, and an     ejection fraction of 73%.  3. Cardiac catheterization January 21, 2001 showing distal left main 30%,     proximal left anterior descending artery 40%, second and third     diagonal 50%, ostial circumflex 60%, and 30% in-stent restenosis with     an ejection fraction of greater than 65%.  4. History of colon cancer with surgery in 1999.  5. Small bowel obstruction  in 1999.  6. Ventral hernia and abscess in March 2002.  7. Hyperlipidemia with an intolerance to BAYCOL.  8. History peripheral vascular disease.  9. Atherosclerotic peripheral vascular disease status post bilateral iliac     artery stents. 10. Incomplete right bundle-branch block in January 2002 with a complete right     bundle-branch block in August 2002. 11. History of remote hysterectomy. 12. Hypertension. 13. History of ALLERGY to SULFA. Dictated by:   Lavella Hammock, P.A. Attending Physician:  Veneda Melter DD:  01/21/01 TD:  01/21/01 Job: [id number]

## 2010-10-14 NOTE — Assessment & Plan Note (Signed)
[facility]                              CARDIOLOGY OFFICE NOTE   Abigail [name], Abigail [name]                        MRN:          [id number]  DATE:02/12/2006                            DOB:          06-04-1919    IDENTIFICATION:  Abigail [name] is an 77 year old woman with a history of CV  disease with coronary artery disease, cerebrovascular disease, peripheral  vascular disease, atrial fibrillation, dyslipidemia.  I last saw her back in  November of last year.   In the interval, she denies chest pain, notes rare shortness of breath, no  change in her ability to do things.  She takes activity as tolerated  regarding yard work.  No PND.  She is followed by Dr. Hart Rochester for peripheral  vascular disease.   CURRENT MEDICATIONS:  1. Iron 54 mg daily.  2. Protonix 40 mg daily.  3. Aspirin 81 mg daily.  4. Atenolol 25 mg daily.  5. Foltx daily.  6. Plavix 75 mg daily.  7. Centrum Silver daily.  8. Vitamin C daily.  9. Advair 100/50 mcg b.i.d.  10.Grape seed two tablets daily.   INTOLERANCES:  The patient is intolerant to sulfa, also intolerant to  statins and Zetia.   PHYSICAL EXAMINATION:  GENERAL:  The patient is in no distress.  VITAL SIGNS:  Blood pressure is 122/63, pulse is 63, weight 154.  LUNGS:  Clear.  CARDIAC:  Regular rate and rhythm, S1, S2, no significant murmurs.  No S3.  ABDOMEN:  Benign.  No hepatomegaly.  EXTREMITIES:  1+ dorsalis pedis bilaterally.  No edema.   IMPRESSION:  1. Coronary artery disease, catheterization 2002, nonobstructive disease.      She is status post a percutaneous transluminal coronary angioplasty in      the past to the right coronary artery.  Myoview scan in January 2005:      Normal perfusion.  Clinically doing okay.  No concerns for active      ischemia.  Would follow.  2. Dyslipidemia.  By her report, has not tolerated statins.  I tried      Zetia, did not tolerate.  Would watch diet.  She says she eats what  she      wants.  I recommended Benecol spread, and she will look for this.  3. Hypertension, good control.  4. History of cardiovascular disease.  Last carotid ultrasound back in      March showed chronic occlusion of the right internal carotid artery, 60-      79% left internal carotid artery.  The patient denies any neurologic      complaints.  Will set up for follow-up with this.  5. History of transient atrial fibrillation at timing of upper respiratory      infection.  6. History of peripheral vascular disease, followed by Dr. Hart Rochester.  7. History of anemia.   I will set follow-up for summer, sooner if problems develop.   ADDENDUM:  Twelve-lead EKG:  Normal sinus rhythm, 63 beats per minute.  Left  anterior fascicular block.  Right bundle branch block, unchanged.  Abigail Riffle, MD, [facility]    PVR/MedQ  DD:  02/12/2006  DT:  02/13/2006  Job #:  [id number]

## 2010-10-14 NOTE — Discharge Summary (Signed)
NAME:  Abigail [name], Abigail [name]                           ACCOUNT NO.:  [id number]   MEDICAL RECORD NO.:  [id number]                   PATIENT TYPE:  INP   LOCATION:  3729                                 FACILITY:  [facility]   PHYSICIAN:  Lonzo Cloud. Kriste Basque, M.D. [facility]            DATE OF BIRTH:  [birth year]   DATE OF ADMISSION:  05/17/2003  DATE OF DISCHARGE:  05/22/2003                                 DISCHARGE SUMMARY   DISCHARGE DIAGNOSES:  1. Admitted on May 17, 2003, by Dr. Titus Dubin. Hopper with new onset     atrial fibrillation and bronchitic exacerbation of chronic obstructive     pulmonary disease with a questionable left lower lobe infiltrate.  The     patient responded to broad-spectrum antibiotics, mucolytic therapy and     nebulized medications, and converted spontaneously to a normal sinus     rhythm, which was maintained.  2. Paroxysmal atrial fibrillation (new this admission), history of coronary     artery disease with previous percutaneous coronary angiography in 2000.     Cardiology consultation Dr. Duke Salvia et al this admission.  The     patient converted to a normal sinus rhythm, and medications were     adjusted.  3. History of asthmatic bronchitis.  The patient has been on Advair at home.     A chest x-Trick on admission with questionable left lower lobe infiltrate.     A CT scan on admission negative for pulmonary thromboembolism, and     confirmed presence of left lower lobe infiltrate.  Serial films with     resolution of acute abnormality, and some residual scarring noted in the     left base.  4. History of arteriosclerotic peripheral vascula disease with previous     femoral-popliteal bypass.  5. History of a gastrointestinal bleed in May 2004, with possible     arteriovenous malformation.  6. History of colon cancer with a hemicolectomy in 1999.  Subsequent small     bowel obstruction and wound complications with eventual ventral hernia     repair in 2002.  7.  History of hypertension.  8. History of hyperlipidemia.  9. History of total abdominal hysterectomy and bilateral cataract surgery.   HISTORY OF PRESENT ILLNESS:  The patient is an 77 year old white female who  was admitted on May 17, 2003, by Dr. Alwyn Ren, with new onset of atrial  fibrillation and bronchitic exacerbation with question of a left lower lobe  infiltrate.  The symptoms started the day before and were progressive.  She  came to the emergency room at 4:30 a.m. with a low-grade fever and sputum  production, and was found to be in atrial fibrillation.  She was admitted  for further evaluation and treatment.   PAST MEDICAL HISTORY:  The past history includes coronary artery disease  with previous percutaneous coronary angiography in 2000.  She has known  arteriosclerotic peripheral vascular disease with an iliac angioplasty and a  femoral-popliteal bypass.  She has a history of colon cancer with a  hemicolectomy in 1999.  Subsequent small bowel obstruction.  An abdominal  abscess and a ventral hernia repair in 2002.  Her last admission was in May  2004, for a GI bleed believed to be secondary to an arteriovenous  malformation.  She has a history of hypertension controlled with  medications.  She has a history of hyperlipidemia on Pravachol.  She has had  a previous total abdominal hysterectomy and bilateral cataract surgery.   PHYSICAL EXAMINATION:  GENERAL:  In the emergency room by Dr. Alwyn Ren  revealed an 77 year old white female in no acute distress.  VITAL SIGNS:  Blood pressure 108/42, pulse between 110-124, with atrial  fibrillation, respirations 20 per minute and not labored, temperature 99  degrees.  HEENT:  Unremarkable.  NECK:  Showed bilateral carotid bruits.  Normal thyroid on palpation.  No  lymphadenopathy.  CHEST:  Revealed rales and rhonchi bilaterally.  No signs of consolidation.  HEART:  Irregular tachycardia with grade 1/6 systolic ejection murmur in  the  left sternal border.  ABDOMEN:  Midline scars and an aortic bruit noted.  No definite aneurysm  felt.  No evidence of organomegaly or masses.  EXTREMITIES:  Decreased pulses distally.  Negative Homan's.  No clubbing,  cyanosis, or edema.  NEUROLOGIC:  Intact without focal abnormalities detected.   LABORATORY DATA:  Electrocardiogram showed a right bundle branch block,  nonspecific ST-T wave changes and atrial fibrillation with a rapid  ventricular response.  The patient was monitored and converted to a normal  sinus rhythm spontaneously.  A follow-up electrocardiogram showed normal  sinus rhythm with some PAC's and a right bundle branch block.  A chest x-Bitting showed on admission the heart in the upper limits of normal in  size, underlying chronic obstructive pulmonary disease and a question of  left lower lobe pneumonia.  A subsequent CT of the chest showed negative for  pulmonary emboli, and confirmed the presence of a left lower lobe  infiltrate.  She also had calcifications of the coronary arteries and the  aorta noted.  Follow-up chest x-Heberlein showed  improvement in the left basilar  infiltrate with some scarring present, and no acute changes.  Hemoglobin 13.9, hematocrit 41.3, white count 11,800, with 95% segs.  Serial  CBC's were followed throughout her hospital course.  D-dimer was elevated in  the emergency room, leading to the CT of the chest with pulmonary embolism  protocol.  It was negative.  Sodium 137, potassium 3.9, chloride 106, BUN  15, creatinine 1.2, blood sugar 171.  Hemoglobin A1c of 4.7.  CPK 44,  negative MB and negative troponin.  Beta natriuretic peptide 165.  TSH  0.328, free T4 of 1.07, free T3 of 1.8.  A CT of the chest did note a thyroid nodule, which will be followed up as an  outpatient.   HOSPITAL COURSE:  The patient was admitted by Dr. Alwyn Ren and started on IV  Rocephin, nebulizer treatments with Xopenex, and Guaiphenesin.  She was placed on a  Cardizem drip as well.  She was seen in consultation by Dr.  Graciela Husbands of the cardiology service.  He added digoxin to the regimen, but later  both the Cardizem and the digoxin were discontinued.  Her atenolol was  adjusted up to 25 mg p.o. daily, with a good control of her heart rate in  the hospital.  Her cough improved and phlegm diminished.  She was switched  to oral Ceftin.  Because of some persistent wheezing, she was given some  prednisone, and will taper this slowly as an outpatient.   DISPOSITION:  She was felt to be at maximum hospital benefit and ready for  discharge on May 22, 2003.   DISCHARGE MEDICATIONS:  1. Ceftin 250 mg p.o. b.i.d. until gone.  2. Prednisone 20 mg tab in a tapering schedule as directed.  3. Advair 100/50 mg, one puff b.i.d.  4. Enteric-coated aspirin 81 mg p.o. daily.  5. Plavix 75 mg p.o. daily.  6. A multivitamin daily.  7. Protonix 40 mg p.o. daily.  8. Pravachol 20 mg p.o. q.h.s.  9. Atenolol 25 mg p.o. daily.  10.      She will take Tylenol p.r.n. pain.   DISCHARGE INSTRUCTIONS:  She is instructed to call Dr. Wynelle Bourgeois  office on Monday, May 25, 2003, for a follow-up appointment next week.   CONDITION ON DISCHARGE:  Improved.   ADDENDUM:  As noted, a CT of the chest indicated a possible thyroid nodule,  and the TSH was at the lower end of normal.  This will be followed up as an  outpatient.                                                Lonzo Cloud. Kriste Basque, M.D. [facility]    SMN/MEDQ  D:  05/22/2003  T:  05/24/2003  Job:  [id number]   cc:   Laurita Quint, M.D.  [address] [place]  [place] [address]  Fax: [phone]

## 2010-10-14 NOTE — Discharge Summary (Signed)
[facility]  Patient:    Abigail [name], Abigail [name]                          MRN: [id number] Adm. Date:  [id number] Disc. Date: [id number] Attending:  Brandy Hale CC:         Laurita Quint, M.D.  Lacretia Leigh. Ninetta Lights, M.D.   Discharge Summary  FINAL DIAGNOSES: 1. Abdominal wall abscess secondary to methicillin-resistant Staphylococcus    aureus. 2. Ventral incisional hernia. 3. Coronary artery disease, status post percutaneous transluminal coronary    angioplasty with stent. 4. Atherosclerotic peripheral vascular disease, status post iliac artery    stents. 5. History of right colon resection for cancer, January 1999.  OPERATIONS PERFORMED:  Drainage of abdominal wall abscess and repair of incarcerated ventral incisional hernia, June 28, 2000.  HISTORY:  This is a 77 year old white female, who had a 2-3 week history of postprandial abdominal pain with no vomiting or change in her bowel habits or fever.  She had noted a small mass in her right lower quadrant for about two years since her previous surgery.  She had undergone a right colectomy for colon cancer in 1999, and that was complicated by emergent re-operation for obstruction and infection, necessitating an ileostomy.  The patient subsequently had closure of her ileostomy a few months later.  She was seen by Dr. Mosetta Anis one week prior to this admission, and a CT scan was ordered.  Dr. Orpah Greek reviewed the CT scan on the day of admission and reported that it showed a fluid collection and a hernia in the right lower quadrant ileostomy site.  The patient reported redness and tenderness of the right lower quadrant incision for 24 hours.  She came to the hospital emergently for evaluation and surgical management by me upon the referral by Dr. Mosetta Anis.  For details of her past history, social history, family history, please see the detailed admission noted.  PHYSICAL  EXAMINATION:  GENERAL:  Pleasant, elderly, white female in minimal distress.  VITAL SIGNS:  Temperature 98.3, blood pressure 133/59, heart rate 101, respiratory rate 20.  HEART:  Regular rate and rhythm with no murmur.  No carotid bruits.  LUNGS:  Clear to auscultation.  ABDOMEN:  Soft with multiple scars.  There was a tender mass in the right lower quadrant overlying the skin scar.  This was erythematous and tender. This mass was not reducible.  Bowel sounds were present.  The rest of the abdomen was soft and nondistended.  HOSPITAL COURSE:  The patient was taken to the operating room that afternoon, and the right lower quadrant incision was explored.  We drained an abscess in that area and explored and repaired a ventral hernia.  The small bowel was found in the hernia, but it was not inflamed and did not appear to be the cause of the abscess.  Small bowel was dissected away from the hernia and reduced, and the hernia was repaired primarily with nonabsorbable sutures.  Postoperatively, the patient did fairly well.  She progressed slowly but progressively in her diet and activities.  She had some fever the first day or two, but that resolved.  She had mild hypotension probably due to dehydration that was corrected with IV fluid rehydration.  She was ready for discharge on July 02, 2000.  We knew that she was growing Gram positive cocci in her wound and were getting ready to send her home on oral Augmentin.  We got a call from the lab that showed that the wound was growing MRSA.  I then asked Dr. Johny Sax of the infectious disease service to see her, and he recommended two weeks of IV vancomycin.  A PICC line was placed, and she was started on IV vancomycin.  She got nauseated and vomited after the first dose of vancomycin but after the second dose, she did well.  She was discharged on July 04, 2000.  On the date of discharge, she was afebrile, feeling good,  tolerating her diet well, ambulating a lot.  The abdomen was soft.  Her right lower quadrant wound was clean with no signs of infection.  Head and neck exam did show a suggestion of early thrush.  DISCHARGE MEDICATIONS: 1. IV vancomycin for 14 days to be managed by the pharmacy. 2. Diflucan 100 mg p.o. q.d. x 4 days. 3. Her usual medications.  FOLLOW-UP:  She was asked to return to see me in the office in 5-6 days. DD:  07/16/00 TD:  07/17/00 Job: [id number]

## 2010-10-14 NOTE — H&P (Signed)
[facility]  Patient:    Abigail [name], Abigail [name]                          MRN: [id number] Adm. Date:  [id number] Attending:  Brandy Hale CC:         Arta Silence, M.D.  Milus Mallick, M.D.  Quita Skye Hart Rochester, M.D.   History and Physical  CHIEF COMPLAINT:  Abdominal pain and mass.  HISTORY OF PRESENT ILLNESS:  This is a 77 year old white female who presents with a two to three-week history of diffuse postprandial abdominal pain.  She denies fever, nausea, vomiting, or change in her bowel habits.  She typically has two to four soft stools per day.  It is notable that she underwent right colectomy for colon cancer in January of 1999.  She developed bowel obstruction and staphylococcal enterocolitis with resulting ischemia of the ileum and had to be reoperated for resection of the ischemic ileum and a temporal ileostomy in February of 1999.  She recovered and her ileostomy was closed in July of 1999.  Her bowel habits had been stable since that time.  She saw Milus Mallick, M.D., last week with her pain and he ordered a CT scan.  The CT scan was done on June 25, 2000, and she followed up with Milus Mallick, M.D., in the office today.  She states that she has had redness in her right lower quadrant old ileostomy site for about 24 hours. She states that she has had a small lump there for some time, but it has gotten bigger in the last few days.  The CT scan shows a hernia containing small bowel in the right lower quadrant wound.  There are some inflammatory changes and possibly and early abscess in that area.  There is no obvious evidence of perforation of free air in the wound or the abdomen.  There is no obvious intra-abdominal process, although one loop of small bowel appears somewhat dilated.  I have reviewed these films with Audie Pinto, M.D.  She is admitted and is being prepared for surgery at this time.  PAST MEDICAL HISTORY:   Right colectomy for colon cancer in January of 1999. Emergent re-exploration and resection of ischemic ileum with temporary diverting ileostomy in February of 1999.  She had a stormy postoperative course.  She had closure of her ileostomy in July of 1999.  She had a vaginal hysterectomy in 1964.  She has peripheral vascular disease and has had stents and angioplasties in both legs, most recently right and left iliac angioplasty in August of 2001 by Quita Skye. Hart Rochester, M.D.  She has had nosebleeds.  She has coronary artery disease and has had a stent placed in [place], [place].  She has gastroesophageal reflux disease.  CURRENT MEDICATIONS: 1. Prevacid daily. 2. Aspirin daily. 3. Multivitamins daily.  DRUG ALLERGIES:  SULFA.  SOCIAL HISTORY:  The patient is married for 55 years.  They have three grown children.  She lives in or near [place], [place].  Denies use of alcohol or tobacco.  FAMILY HISTORY:  Her father died at age 69 of stomach cancer.  Her mother died of blocked kidneys.  A sister died of uterine cancer.  REVIEW OF SYSTEMS:  All systems are reviewed and are negative, except as described above.  PHYSICAL EXAMINATION:  A pleasant, alert, cooperative, elderly, white female in minimal distress.  VITAL SIGNS:  Temperature 98.3 degrees, blood  pressure 133/59, heart rate 101, respiratory rate 20.  HEENT:  Sclerae clear.  Extraocular movements intact.  Oropharynx with multiple dental fillings.  No acute lesions.  NECK:  Supple.  Nontender.  No mass.  No adenopathy.  No bruit.  LUNGS:  Clear to auscultation.  BREASTS:  Not examined.  HEART:  Regular rate and rhythm.  No murmur.  ABDOMEN:  Soft with active bowel sounds.  There is a well-healed midline scar and there is a healed transverse scar in the right lower quadrant where her old ileostomy was.  There is as palpable mass in this area about the size of a lemon and this is somewhat tender and  nonreducible.  There is some overlying skin and erythema, but no crepitants and no obvious fluid collection.  PELVIC:  Exam not performed.  EXTREMITIES:  Free range of motion.  No deformity.  Warm feet, well vascularized.  NEUROLOGIC:  Grossly within normal limits.  ADMISSION LABORATORY DATA:  Pending.  IMPRESSION: 1. Incarcerated ventral hernia, right lower quadrant ileostomy site.  It is    unknown whether she has an abscess that is developing and it is unknown    whether she has compromised bowel or not. 2. Coronary artery disease. 3. Atherosclerotic peripheral vascular disease. 4. Gastroesophageal reflux disease.  PLAN:  The patient will be admitted and started on IV antibiotics.  I plan to take her to the operating room sometime later tonight for exploration of the abdominal wound and possible laparotomy if bowel resection is required.  I have discussed the indications and details of surgery with her and her daughter.  The risks and complications have been outlined, including, but not limited to bowel perforation, bleeding, postoperative infection, recurrent hernia, and other unforeseen complications.  They seem to understand these issues well.  At this time, all of their questions were answered.  They agree with this plan. DD:  06/28/00 TD:  06/28/00 Job: [id number]

## 2010-10-14 NOTE — H&P (Signed)
NAMECHERIS, TWETEN                           ACCOUNT NO.:  [id number]   MEDICAL RECORD NO.:  [id number]                   PATIENT TYPE:  EMS   LOCATION:  MAJO                                 FACILITY:  [facility]   PHYSICIAN:  Titus Dubin. Alwyn Ren, M.D. [facility]         DATE OF BIRTH:  1920-05-17   DATE OF ADMISSION:  05/17/2003  DATE OF DISCHARGE:                                HISTORY & PHYSICAL   HISTORY OF PRESENT ILLNESS:  Ms. Keilman is an 77 year old white female admitted  with new onset atrial fibrillation and bronchitis, presenting with shortness  of breath.  She had gradual onset of shortness of breath approximately 8  p.m., May 16, 2003.  This progressed to the point that she asked her  family to take her to the emergency room at 0430 this morning.  She has had  some low-grade fever and white sputum production but denies chest pain.  She  has had no purulence from head, eyes, nose or throat.   PAST MEDICAL HISTORY:  1. Possible AVM GI bleed in May of 2004.  2. Coronary artery disease and had percutaneous transluminal coronary     angioplasty in 2000.  3. Peripheral vascular disease and femoral-popliteal bypass.  4. Colon cancer with hemicolectomy in 1999.  Subsequently she had small     bowel obstruction and also an abdominal wall abscess and ventral hernia     repair in 2002.  5. She has had iliac angioplasty.  6. Total abdominal hysterectomy.  7. Bilateral cataract surgery in 2004.   MEDICAL PROBLEMS:  1. Hypertension.  2. Hyperlipidemia.  3. Right bundle branch block.   MEDICATIONS:  1. Advair 100/50 q.12h.  2. Plavix 75 mg.  3. Aspirin 81 mg.  4. Atenolol 25 mg 1/2 daily.  5. Folic acid apparently 2.5 mg.  6. Multivitamins.   ALLERGIES:  BAYCOL (intolerance), AND ALLERGIC TO SULFA.   SOCIAL HISTORY:  She lives with a daughter and does not smoke or drink.   FAMILY HISTORY:  Her brother has had a pacemaker.  One brother has a bypass.  A brother died of lung cancer  last year.  A sister died of a stroke last  year.  Her father died at 48 of stomach cancer.  There is no family history  of diabetes.   She saw Dr. Chiquita Loth May 15, 2003 and was declared okay except  for weak, weak lungs.  She describes some occasional nonspecific headache  which tends to be more occipital for which she does not take any medication.  She has had no chest pain associated with the shortness of breath.  She has  had no genitourinary or GI symptomatology, specifically no rectal bleeding  or melena.   PHYSICAL EXAMINATION:  VITAL SIGNS:  She is in no acute distress although  she exhibits atrial fibrillation on the monitor with rates of 109 to 124,  blood pressure is 108/42,  respirations were 20.  HEENT:  She exhibits arterial narrowing on fundal exam.  There is an ostium  of the hard palate.  Dental hygiene is surprisingly good for her age and she  retains all her own teeth.  NECK:  Bilateral carotid bruits are noted. Thyroid is normal to palpation.  She has no lymphadenopathy of the head, neck or axilla.  HEART:  There is an irregular tachycardia with a flow murmur.  LUNGS:  She has rhonchi and rales but no increased work of breathing.  ABDOMEN:  Reveals midline operative scars and an aortic bruit but no  definite aneurysm.  EXTREMITIES:  Pedal pulses are decreased.  Homan sign is equivocal  bilaterally.  NEUROLOGIC:  There are no localizing neuropsychiatric deficits.  BREAST/PELVIC/RECTAL:  Exams are deferred as they do not pertain to this  admission.   Chest x-Villela suggest a possible left lower lobe pneumonia.  A CAT scan shows  just increased markings with some atelectasis.  There is no definite PTE.   IMPRESSION:  She is now admitted with new onset atrial fibrillation with  clinical bronchitis.   PLAN:  1. Intravenous Cardizem has been initiated and a heparin protocol will be     initiated.  2. Close monitoring of the stools and hematocrit will be  necessary because     of her previous history of GI bleed.  3. She will receive nebulized Xopenex is less likely to cause dysrhythmias.  4. Intravenous Rocephin.  5. Sliding scale Insulin (initial glucose was 171).   The Rocephin is felt to be clinically indicated based on her exam and  history and the white count of 11,600.                                                Titus Dubin. Alwyn Ren, M.D. [facility]    WFH/MEDQ  D:  05/17/2003  T:  05/18/2003  Job:  [id number]   cc:   Pricilla Riffle, M.D.   Laurita Quint, M.D.  [address] [place]  [place] [address]  Fax: [phone]

## 2010-10-14 NOTE — Letter (Signed)
November 21, 2007    M. Arbutus Ped, D.P.M. FACFAS  [address]  [place], [place] [address].   RE:  ASHANTE, YELLIN  MRN:  [id number]  /  DOB:  April 22, 1920   Dear Dr. Al Corpus;   This is a letter regarding Abigail [name].  I received your communication  from Oct 10, 2007.  I have followed Ms. Walmsley in clinic for a while.  She  is on Plavix for cerebrovascular disease.  She also has a history of  coronary artery disease (Myoview with no ischemia in 2005).  I last saw  her in November.  She also has a history of atrial fibrillation and  passed around the time of the URI.   When I saw her last, her progress was fairly steady.  She does give out  with activity, but this has been a chronic problem.  She is followed in  a Pulmonary Clinic.  She has a history of COPD.   I think it would be okay to take her off the Plavix for a short time.  Again, it is being used post CVA.  I would continue other medicines as  she is taking.  If you have any questions please feel free to call.    Sincerely,      Pricilla Riffle, MD, [facility]  Electronically Signed    PVR/MedQ  DD: 11/22/2007  DT: 11/23/2007  Job #: [id number]

## 2010-10-14 NOTE — Op Note (Signed)
[facility]  Patient:    Abigail [name], Abigail [name]                          MRN: [id number] Proc. Date: 06/28/00 Adm. Date:  [id number] Attending:  Brandy Hale CC:         Laurita Quint, M.D., [facility]   Operative Report  PREOPERATIVE DIAGNOSIS:  Incarcerated ventral hernia.  POSTOPERATIVE DIAGNOSES: 1. Incarcerated ventral incisional hernia. 2. Abdominal wall abscess.  OPERATION: 1. Drainage of abdominal wall abscess. 2. Repair of incarcerated ventral incisional hernia.  SURGEON:  Angelia Mould. Derrell Lolling, M.D.  FIRST ASSISTANT:  Anselm Pancoast. Zachery Dakins, M.D.  INDICATIONS: This is a 77 year old white female who underwent colon resection in 1999.  That surgery was complicated by ischemia of the ileum just proximal to the anastomosis necessitating emergent laparotomy and resection with diverting ileostomy.  After a stormy hospital course, she recovered and six months later had her ileostomy closed.  She has done reasonably well from the abdominal standpoint.  She has had a small bulge in her right lower quadrant where her ileostomy was for some time.  For the past week or two, she has had progressive pain and enlarging bulge in the right lower quadrant, but she has not had any fever, nausea or vomiting, or change in her bowel habits.  She now has a red mass in the right lower quadrant at the site of her previous ileostomy, and the CT scan shows inflammatory change in the abdominal wall as well as a ventral hernia in that location.  She is brought to the operating room urgently.  INTRAOPERATIVE FINDINGS:  There was an abscess in the abdominal wall underneath the skin in the right lower quadrant.  This had creamy tan pus, but there was no odor.  Cultures were obtained.  She had small bowel caught in her ventral hernia.  There was a defect approximately 3 or 4 cm in diameter necessitating dissection of the small bowel  extensively to get it out of the hernia sac.  The small bowel, however, was soft and not inflamed, and there did not appear to be any obstruction or defect of the small bowel.  The small bowel did not appear to be involved in the inflammatory process and did not appear to be a source of the abscess.  OPERATIVE TECHNIQUE:  Following the induction of general endotracheal anesthesia, the patients abdomen was prepped and draped in sterile fashion. The old transverse right lower quadrant incision was opened and dissection carried down through the subcutaneous tissue.  We encountered an abscess laterally which we cultured and drained.  We then slowly debrided the abscess wall and ultimately were able to identify the anterior abdominal wall fascia and the rim of the hernia defect.  We carefully dissected around the rim of the hernia defect.  The small bowel had to be dissected away from the hernia defect sharply and reduced into the abdominal cavity.  There was no abscess in the abdominal cavity.  There was no inflammatory change under the fascia.  We debrided the abscess wall circumferentially and then mobilized the abdominal wall musculature circumferentially.  Once we had the small bowel well reduced, we chose to close the fascia transversely with interrupted sutures of #1 Novofil.  The fascia was closed with five interrupted sutures of #1 Novofil, in a vest-over-pants fashion.  We placed all of the sutures and then tied them all.  This  provided a nice secure closure.  We did not feel that we could safely place mesh, and so that was not done.  Hemostasis was adequate and achieved with electrocautery.  The wound was copiously irrigated with saline. We closed the skin with just four staples and placed long Telfa Wicks down in between each of the staples to promote drainage.  A clean bandage was placed, and the patient taken to the recovery room in stable condition.  Estimated blood loss was  about 10o cc.  Complications: None.  Sponge, needle, and instrument counts were correct. DD:  06/28/00 TD:  06/30/00 Job: [id number]

## 2010-10-14 NOTE — Consult Note (Signed)
NAME:  SERAPHINA, MITCHNER                           ACCOUNT NO.:  [id number]   MEDICAL RECORD NO.:  [id number]                   PATIENT TYPE:  INP   LOCATION:  3729                                 FACILITY:  [facility]   PHYSICIAN:  Duke Salvia, M.D.               DATE OF BIRTH:  March 13, 1920   DATE OF CONSULTATION:  05/17/2003  DATE OF DISCHARGE:                                   CONSULTATION   REASON FOR CONSULTATION:  We had the privilege of seeing Mrs. Jimmie Molly at  the request of Dr. Alwyn Ren because of atrial fibrillation occurring in the  context of pneumonia.   HISTORY OF PRESENT ILLNESS:  Mrs. Biehler is an 77 year old woman with a past  cardiac history notable for an RCA stenting by Dr. Kalman Shan in  [place] in the year 2000 and nonobstructive coronary disease by repeat  catheterization in 2002 at which time she had normal left ventricular  function.  She has had an irregular heartbeat.   She saw Dr. Tenny Craw on Friday at which time she was doing quite well.  Over the  ensuing 24 hours she has become febrile and has had progressive dyspnea  prompting her coming to the emergency room today.  She has had  nonproductive, _______ sputum.   PAST MEDICAL HISTORY:  1. GI bleed.  2. History of colonic cancer status post hemiresection 1999 with repeat     bleeding in May 2004.  3. She has peripheral vascular disease status post fem-pop bypass.  4. She has hyperlipidemia.  5. Hypertension.  6. Abdominal wall abscess.  7. She has also had some cataracts.   MEDICATIONS:  1. Advair.  2. Plavix 75.  3. Aspirin.  4. Folic acid.  5. Atenolol 12.5.   ALLERGIES:  BAYCOL, SULFA.   SOCIAL HISTORY:  She does not use cigarettes or alcohol.  She is here today  with her son and her daughter.   FAMILY HISTORY:  Notable for coronary artery disease.   REVIEW OF SYSTEMS:  As noted on Dr. Frederik Pear intake note as well as that  from Guy Franco from today and is not further recounted here.   PHYSICAL EXAMINATION:  GENERAL:  She is an elderly Caucasian female in mild  respiratory distress.  VITAL SIGNS:  Blood pressure 120/36, pulse 105 and regular, respirations 22  and minimally labored, temperature now is 98.8 down from 102.  HEENT:  Demonstrated no icterus and no xanthomata.  NECK:  The neck veins were 7.  The carotids were brisk and full bilaterally  without bruits.  BACK:  Without kyphosis, scoliosis.  LUNGS:  Sounds were markedly decreased.  HEART:  Sounds were distant, regular but no murmurs were appreciated.  ABDOMEN:  Soft with active bowel sounds without midline pulsation.  EXTREMITIES:  Femoral pulses were present.  Distal pulses were diminished.  There was no clubbing, cyanosis, edema.   LABORATORIES:  Electrocardiogram initially demonstrated right bundle branch  block and left posterior fascicular block with atrial fibrillation.  Now she  is in sinus rhythm with frequent PACs with the same conduction abnormality.   Laboratories are notable for a white count of 11.8, hemoglobin of 13.9, MCV  107.  Her CK and MBs are abnormal with an MB fraction of 21.0 and #2 16.5  with normal troponins.  Her D-dimer is also elevated.   IMPRESSION:  1. Atrial fibrillation with rapid ventricular response aggravating dyspnea.  2. Pneumonia prompting #1.  3. Coronary artery disease with right coronary artery stenting in 2000 and     nonobstructive disease in 2002 and now with positive MBs, but negative     troponins.  4. Thromboembolic risk factors notable for age and hypertension.  5. Peripheral vascular disease status post fem-pop bypass on the left.  6. Anemia attributed to the past to avascular necrosis, but now with     increased MCV.  7. Right bundle branch block with posterior fascicular block.   RECOMMENDATIONS:  Mrs. Tostenson has atrial fibrillation in the context of her  pneumonia and fever.  Beta blockade may help decrease the adrenergic  response to her infection.   I  am bothered by the abnormal MB fractions and will need to follow these  enzymes serially.                                               Duke Salvia, M.D.    SCK/MEDQ  D:  05/17/2003  T:  05/18/2003  Job:  [id number]   cc:   Laurita Quint, M.D.  [address] [place]  [place] [address]  Fax: [phone]

## 2010-10-14 NOTE — Assessment & Plan Note (Signed)
[facility]                         GASTROENTEROLOGY OFFICE NOTE   RENN, STILLE                        MRN:          [id number]  DATE:08/29/2006                            DOB:          05/12/1920    The patient comes in denying any GI symptoms.  She says she feels good.  She is status post colon cancer resection.  She is very happy with this  and says that Dr. Orpah Greek and myself saved her life.  She does have some  gastritis.  She has had some anemia and there has been some question  about this and that is why she came in today.  She is on some iron,  Protonix, baby aspirin, atenolol, Plavix, Centrum, Advair.   PAST MEDICAL HISTORY:  1. Dyslipidemia.  2. Coronary artery disease.  3. Hypertension.  4. Cardiovascular disease.  5. History of anemia.   She is 77 years of age.  There has been question of AV malformation in  her GI tract as well.  She has known GERD.  History of atrial  fibrillation.   PHYSICAL EXAMINATION:  VITAL SIGNS:  Weight 151.  Blood pressure 126/54.  Pulse 82 and regular.  Neck, chest, extremities all unremarkable.   IMPRESSION:  1. Anemia of questionable etiology.  2. Arteriosclerotic cardiovascular disease.  3. Hypertension.  4. Gastroesophageal reflux disease.  5. History of atrial fibrillation.  6. Peripheral vascular disease.  7. Status post right hemicolectomy for colon cancer.   RECOMMENDATIONS:  We will get a CBC, serum iron, TIBC, ferritin level, B-  12, folate, colon screens on her and continue her medications as above.     Ulyess Mort, MD  Electronically Signed    SML/MedQ  DD: 08/29/2006  DT: 08/30/2006  Job #: [id number]

## 2010-10-25 ENCOUNTER — Ambulatory Visit (INDEPENDENT_AMBULATORY_CARE_PROVIDER_SITE_OTHER): Payer: Medicare Other | Admitting: Family Medicine

## 2010-10-25 ENCOUNTER — Encounter: Payer: Self-pay | Admitting: Family Medicine

## 2010-10-25 ENCOUNTER — Ambulatory Visit (INDEPENDENT_AMBULATORY_CARE_PROVIDER_SITE_OTHER)
Admission: RE | Admit: 2010-10-25 | Discharge: 2010-10-25 | Disposition: A | Discharge status: Home / Self Care | Payer: Medicare Other | Source: Ambulatory Visit | Attending: Family Medicine | Admitting: Family Medicine

## 2010-10-25 VITALS — BP 142/70 | HR 72 | Temp 98.2°F | Wt 142.8 lb

## 2010-10-25 DIAGNOSIS — M79609 Pain in unspecified limb: Secondary | ICD-10-CM

## 2010-10-25 DIAGNOSIS — M79672 Pain in left foot: Secondary | ICD-10-CM

## 2010-10-25 NOTE — Patient Instructions (Signed)
Wear the brace for the next few days.  As you feel better you can gradually wear it less.  Let me know if you have more trouble.  Take care.

## 2010-10-26 ENCOUNTER — Encounter: Payer: Self-pay | Admitting: Family Medicine

## 2010-10-26 NOTE — Assessment & Plan Note (Signed)
No fx seen.  Okay for outpatient fu.  Improved in lace up ASO here in clinic.  This could be a tendonitis.  If not improved, then notify the clinic.  She agrees.

## 2010-10-26 NOTE — Progress Notes (Signed)
L ankle pain, present for a few days.  No trauma.  Pain with walking, better with rest.  Pain inferior to L lat mal.  Was puffy, better today per patient.  No falls.  No trigger known.  Meds, vitals, and allergies reviewed.   ROS: See HPI.  Otherwise, noncontributory.  nad L foot with normal inspection except for minimal puffiness about the ankle.   inferior to L lat and med mal but the mal themselves aren't ttp No bruising.  Mid foot not ttp Mortise stable  Distally nv intact  Films reviewed.

## 2010-11-26 ENCOUNTER — Other Ambulatory Visit: Payer: Self-pay | Admitting: *Deleted

## 2010-11-26 MED ORDER — FLUTICASONE-SALMETEROL 250-50 MCG/DOSE IN AEPB: 1.0000 | INHALATION_SPRAY | Freq: Two times a day (BID) | RESPIRATORY_TRACT | Status: DC

## 2011-02-22 ENCOUNTER — Ambulatory Visit (INDEPENDENT_AMBULATORY_CARE_PROVIDER_SITE_OTHER): Payer: Medicare Other

## 2011-02-22 DIAGNOSIS — Z23 Encounter for immunization: Secondary | ICD-10-CM

## 2011-02-27 LAB — BASIC METABOLIC PANEL
BUN: 13
CO2: 29
Chloride: 101
Chloride: 104
Creatinine, Ser: 0.87
GFR calc Af Amer: >60
GFR calc Af Amer: >60
GFR calc non Af Amer: >60
Potassium: 4
Potassium: 4.7

## 2011-02-27 LAB — CBC
HCT: 36.8
HCT: 40.6
MCHC: 33.4
MCV: 100.8 — ABNORMAL HIGH
MCV: 99.2
Platelets: 151
RBC: 3.65 — ABNORMAL LOW
RBC: 4.09
WBC: 10.5
WBC: 5.7

## 2011-03-07 ENCOUNTER — Other Ambulatory Visit: Payer: Self-pay | Admitting: Internal Medicine

## 2011-03-07 DIAGNOSIS — I6529 Occlusion and stenosis of unspecified carotid artery: Secondary | ICD-10-CM

## 2011-03-08 ENCOUNTER — Encounter (INDEPENDENT_AMBULATORY_CARE_PROVIDER_SITE_OTHER): Payer: Medicare Other | Admitting: Cardiology

## 2011-03-08 DIAGNOSIS — I6529 Occlusion and stenosis of unspecified carotid artery: Secondary | ICD-10-CM

## 2011-05-25 ENCOUNTER — Telehealth: Payer: Self-pay | Admitting: Family Medicine

## 2011-05-25 NOTE — Telephone Encounter (Signed)
Patient notified that rx is up front and ready for pickup. 

## 2011-05-25 NOTE — Telephone Encounter (Signed)
Patient came in person to ask you to write her a RX for the Singles Vaccine. She will take it to [facility] to get her shot. Call the patient at [phone] when the RX is ready to be picked up.

## 2011-05-25 NOTE — Telephone Encounter (Signed)
Script written. Pls ask her to leet the pharmacy know to let us know when given to document her chart.

## 2011-06-23 ENCOUNTER — Other Ambulatory Visit: Payer: Self-pay

## 2011-06-23 MED ORDER — ATENOLOL 25 MG PO TABS: 25.0000 mg | ORAL_TABLET | Freq: Every day | ORAL | Status: DC

## 2011-07-17 ENCOUNTER — Ambulatory Visit (INDEPENDENT_AMBULATORY_CARE_PROVIDER_SITE_OTHER): Payer: Medicare Other | Admitting: Family Medicine

## 2011-07-17 ENCOUNTER — Encounter: Payer: Self-pay | Admitting: Family Medicine

## 2011-07-17 VITALS — BP 132/62 | HR 74 | Temp 97.9°F | Wt 128.0 lb

## 2011-07-17 DIAGNOSIS — I10 Essential (primary) hypertension: Secondary | ICD-10-CM

## 2011-07-17 DIAGNOSIS — J449 Chronic obstructive pulmonary disease, unspecified: Secondary | ICD-10-CM

## 2011-07-17 DIAGNOSIS — M8080XA Other osteoporosis with current pathological fracture, unspecified site, initial encounter for fracture: Secondary | ICD-10-CM

## 2011-07-17 DIAGNOSIS — E049 Nontoxic goiter, unspecified: Secondary | ICD-10-CM

## 2011-07-17 DIAGNOSIS — M8440XA Pathological fracture, unspecified site, initial encounter for fracture: Secondary | ICD-10-CM

## 2011-07-17 MED ORDER — NITROGLYCERIN 0.4 MG SL SUBL: SUBLINGUAL_TABLET | SUBLINGUAL | Status: AC

## 2011-07-17 MED ORDER — AZITHROMYCIN 250 MG PO TABS: ORAL_TABLET | ORAL | Status: AC

## 2011-07-17 NOTE — Patient Instructions (Signed)
Please call about getting an appointment with the cardiac clinic.  I'll check on docs in [place].  Come back for fasting labs.  Start the antibiotics today and you should gradually improve.   Take care.

## 2011-07-17 NOTE — Progress Notes (Signed)
COPD Feels symptoms are well controlled: usually controlled Using medications without problems:yes Night time symptoms:no ER visits since last visit:no Sputum production at baseline: increased over last few days Increased cough:yes Increased SOB: yes Using O2: intermittent use, has needed it more recently with URI sx and cough.  Taking tussin for cough.    H/o vertebral fracture.  Had been discussed with prev MD.  I was going to review records and notify pt, see phone note.  She'll be est with new MD likely in near future in [place].   H/o goiter, due for labs.  No dysphagia.  No neck pain.    PMH and SH reviewed  Meds, vitals, and allergies reviewed.   ROS: See HPI.  Otherwise negative.    GEN: nad, alert and oriented, thin, elderly HEENT: mucous membranes moist NECK: supple w/o LA CV: rrr. PULM: cough noted with course bs but no inc wob, no wheeze ABD: soft, +bs EXT: no edema SKIN: no acute rash

## 2011-07-19 ENCOUNTER — Other Ambulatory Visit (INDEPENDENT_AMBULATORY_CARE_PROVIDER_SITE_OTHER): Payer: Medicare Other

## 2011-07-19 DIAGNOSIS — I10 Essential (primary) hypertension: Secondary | ICD-10-CM

## 2011-07-19 LAB — COMPREHENSIVE METABOLIC PANEL
AST: 24 U/L (ref 0–37)
Alkaline Phosphatase: 57 U/L (ref 39–117)
BUN: 19 mg/dL (ref 6–23)
Creatinine, Ser: 1 mg/dL (ref 0.4–1.2)
Potassium: 4.5 mEq/L (ref 3.5–5.1)

## 2011-07-19 LAB — LIPID PANEL
Cholesterol: 194 mg/dL (ref 0–200)
LDL Cholesterol: 107 mg/dL — ABNORMAL HIGH (ref 0–99)
Triglycerides: 72 mg/dL (ref 0.0–149.0)
VLDL: 14.4 mg/dL (ref 0.0–40.0)

## 2011-07-20 ENCOUNTER — Telehealth: Payer: Self-pay | Admitting: *Deleted

## 2011-07-20 ENCOUNTER — Telehealth: Payer: Self-pay | Admitting: Family Medicine

## 2011-07-20 DIAGNOSIS — M8080XA Other osteoporosis with current pathological fracture, unspecified site, initial encounter for fracture: Secondary | ICD-10-CM | POA: Insufficient documentation

## 2011-07-20 DIAGNOSIS — E049 Nontoxic goiter, unspecified: Secondary | ICD-10-CM | POA: Insufficient documentation

## 2011-07-20 NOTE — Assessment & Plan Note (Signed)
H/o, TSH wnl.

## 2011-07-20 NOTE — Telephone Encounter (Signed)
Left message on voicemail of son's phone.  Current numbers listed for Ms. Kasper have been disconnected.

## 2011-07-20 NOTE — Telephone Encounter (Signed)
Please call pt.  See notes on labs.  Dr. Hetty Ely had talked with her about this per old records, 06/22/10.  He had advised against DXA as her dx of osteoporosis was known.  I would get established with new MD in [place] and consider treatment in addition to vit D.

## 2011-07-20 NOTE — Assessment & Plan Note (Signed)
With likely exacerbation recently, continue regular meds with albuterol and advair. I'd like to avoid oral steroids if possible.  Start abx and fu prn.  Nontoxic.

## 2011-07-20 NOTE — Telephone Encounter (Signed)
LMOVM of son's phone.  The numbers listed for Ms. Tostenson have been disconnected.

## 2011-07-20 NOTE — Assessment & Plan Note (Signed)
Presumed osteoporosis with prev fx.  I would not start bisphosphonate tx due to her h/o GI sx.

## 2011-08-08 ENCOUNTER — Other Ambulatory Visit: Payer: Self-pay

## 2011-08-08 ENCOUNTER — Inpatient Hospital Stay (HOSPITAL_COMMUNITY)
Admission: EM | Admit: 2011-08-08 | Discharge: 2011-08-15 | DRG: 690 | Disposition: A | Discharge status: Home Health | Payer: Medicare Other | Source: Ambulatory Visit | Attending: Internal Medicine | Admitting: Internal Medicine

## 2011-08-08 ENCOUNTER — Encounter (HOSPITAL_COMMUNITY): Payer: Self-pay | Admitting: *Deleted

## 2011-08-08 DIAGNOSIS — K2960 Other gastritis without bleeding: Secondary | ICD-10-CM

## 2011-08-08 DIAGNOSIS — K625 Hemorrhage of anus and rectum: Secondary | ICD-10-CM

## 2011-08-08 DIAGNOSIS — M545 Low back pain, unspecified: Secondary | ICD-10-CM

## 2011-08-08 DIAGNOSIS — Z860100 Personal history of colon polyps, unspecified: Secondary | ICD-10-CM

## 2011-08-08 DIAGNOSIS — Z85038 Personal history of other malignant neoplasm of large intestine: Secondary | ICD-10-CM

## 2011-08-08 DIAGNOSIS — K529 Noninfective gastroenteritis and colitis, unspecified: Secondary | ICD-10-CM | POA: Diagnosis present

## 2011-08-08 DIAGNOSIS — R000 Tachycardia, unspecified: Secondary | ICD-10-CM

## 2011-08-08 DIAGNOSIS — I2510 Atherosclerotic heart disease of native coronary artery without angina pectoris: Secondary | ICD-10-CM

## 2011-08-08 DIAGNOSIS — E785 Hyperlipidemia, unspecified: Secondary | ICD-10-CM

## 2011-08-08 DIAGNOSIS — K5289 Other specified noninfective gastroenteritis and colitis: Secondary | ICD-10-CM | POA: Diagnosis present

## 2011-08-08 DIAGNOSIS — Z8614 Personal history of Methicillin resistant Staphylococcus aureus infection: Secondary | ICD-10-CM

## 2011-08-08 DIAGNOSIS — J449 Chronic obstructive pulmonary disease, unspecified: Secondary | ICD-10-CM

## 2011-08-08 DIAGNOSIS — R300 Dysuria: Secondary | ICD-10-CM

## 2011-08-08 DIAGNOSIS — I6529 Occlusion and stenosis of unspecified carotid artery: Secondary | ICD-10-CM

## 2011-08-08 DIAGNOSIS — R197 Diarrhea, unspecified: Secondary | ICD-10-CM

## 2011-08-08 DIAGNOSIS — Z85828 Personal history of other malignant neoplasm of skin: Secondary | ICD-10-CM

## 2011-08-08 DIAGNOSIS — I700 Atherosclerosis of aorta: Secondary | ICD-10-CM

## 2011-08-08 DIAGNOSIS — R42 Dizziness and giddiness: Secondary | ICD-10-CM

## 2011-08-08 DIAGNOSIS — E860 Dehydration: Secondary | ICD-10-CM

## 2011-08-08 DIAGNOSIS — L989 Disorder of the skin and subcutaneous tissue, unspecified: Secondary | ICD-10-CM

## 2011-08-08 DIAGNOSIS — Z87891 Personal history of nicotine dependence: Secondary | ICD-10-CM

## 2011-08-08 DIAGNOSIS — N390 Urinary tract infection, site not specified: Principal | ICD-10-CM | POA: Diagnosis present

## 2011-08-08 DIAGNOSIS — K219 Gastro-esophageal reflux disease without esophagitis: Secondary | ICD-10-CM | POA: Diagnosis present

## 2011-08-08 DIAGNOSIS — K5730 Diverticulosis of large intestine without perforation or abscess without bleeding: Secondary | ICD-10-CM

## 2011-08-08 DIAGNOSIS — E049 Nontoxic goiter, unspecified: Secondary | ICD-10-CM

## 2011-08-08 DIAGNOSIS — I10 Essential (primary) hypertension: Secondary | ICD-10-CM

## 2011-08-08 DIAGNOSIS — I4891 Unspecified atrial fibrillation: Secondary | ICD-10-CM

## 2011-08-08 DIAGNOSIS — R609 Edema, unspecified: Secondary | ICD-10-CM

## 2011-08-08 DIAGNOSIS — J4489 Other specified chronic obstructive pulmonary disease: Secondary | ICD-10-CM

## 2011-08-08 DIAGNOSIS — I739 Peripheral vascular disease, unspecified: Secondary | ICD-10-CM

## 2011-08-08 DIAGNOSIS — I7090 Unspecified atherosclerosis: Secondary | ICD-10-CM

## 2011-08-08 DIAGNOSIS — L659 Nonscarring hair loss, unspecified: Secondary | ICD-10-CM

## 2011-08-08 DIAGNOSIS — M8080XA Other osteoporosis with current pathological fracture, unspecified site, initial encounter for fracture: Secondary | ICD-10-CM

## 2011-08-08 DIAGNOSIS — M79672 Pain in left foot: Secondary | ICD-10-CM

## 2011-08-08 DIAGNOSIS — Z7982 Long term (current) use of aspirin: Secondary | ICD-10-CM

## 2011-08-08 DIAGNOSIS — D7589 Other specified diseases of blood and blood-forming organs: Secondary | ICD-10-CM | POA: Diagnosis present

## 2011-08-08 LAB — URINALYSIS, ROUTINE W REFLEX MICROSCOPIC
Bilirubin Urine: NEGATIVE
Glucose, UA: NEGATIVE mg/dL
Ketones, ur: NEGATIVE mg/dL
Protein, ur: 100 mg/dL — AB
Urobilinogen, UA: 0.2 mg/dL (ref 0.0–1.0)

## 2011-08-08 LAB — COMPREHENSIVE METABOLIC PANEL
ALT: 17 U/L (ref 0–35)
AST: 25 U/L (ref 0–37)
Alkaline Phosphatase: 62 U/L (ref 39–117)
CO2: 24 mEq/L (ref 19–32)
Chloride: 101 mEq/L (ref 96–112)
GFR calc Af Amer: 53 mL/min — ABNORMAL LOW (ref >90)
GFR calc non Af Amer: 46 mL/min — ABNORMAL LOW (ref >90)
Glucose, Bld: 103 mg/dL — ABNORMAL HIGH (ref 70–99)
Sodium: 134 mEq/L — ABNORMAL LOW (ref 135–145)
Total Bilirubin: 0.9 mg/dL (ref 0.3–1.2)

## 2011-08-08 LAB — URINE MICROSCOPIC-ADD ON

## 2011-08-08 LAB — DIFFERENTIAL
Basophils Absolute: 0 10*3/uL (ref 0.0–0.1)
Eosinophils Relative: 0 % (ref 0–5)
Lymphocytes Relative: 11 % — ABNORMAL LOW (ref 12–46)
Lymphs Abs: 0.8 10*3/uL (ref 0.7–4.0)
Neutro Abs: 6 10*3/uL (ref 1.7–7.7)

## 2011-08-08 LAB — CBC
HCT: 43.9 % (ref 36.0–46.0)
MCV: 100.9 fL — ABNORMAL HIGH (ref 78.0–100.0)
Platelets: 116 10*3/uL — ABNORMAL LOW (ref 150–400)
RBC: 4.35 MIL/uL (ref 3.87–5.11)
RDW: 13.1 % (ref 11.5–15.5)
WBC: 7.6 10*3/uL (ref 4.0–10.5)

## 2011-08-08 LAB — PROTIME-INR: Prothrombin Time: 14.9 seconds (ref 11.6–15.2)

## 2011-08-08 MED ORDER — IOHEXOL 300 MG/ML  SOLN
20.0000 mL | INTRAMUSCULAR | Status: AC
  Administered 2011-08-08: 20 mL via ORAL

## 2011-08-08 MED ORDER — SODIUM CHLORIDE 0.9 % IV BOLUS (SEPSIS)
500.0000 mL | Freq: Once | INTRAVENOUS | Status: AC
  Administered 2011-08-08: 500 mL via INTRAVENOUS

## 2011-08-08 MED ORDER — ONDANSETRON HCL 4 MG/2ML IJ SOLN
4.0000 mg | Freq: Once | INTRAMUSCULAR | Status: AC
  Administered 2011-08-08: 4 mg via INTRAVENOUS
  Filled 2011-08-08: qty 2

## 2011-08-08 MED ORDER — HYDROMORPHONE HCL PF 1 MG/ML IJ SOLN
0.5000 mg | Freq: Once | INTRAMUSCULAR | Status: AC
  Administered 2011-08-08: 0.5 mg via INTRAVENOUS
  Filled 2011-08-08: qty 1

## 2011-08-08 NOTE — ED Notes (Signed)
Pt in per [facility] from home c/o abd pain & N/V/D onset x 2 days & generalized weakness, family member with similar symptoms, pt in A fib during transport to hospital, pt asymptomatic with A fib hx, pt on cardiac monitor, will continue to monitor, NAD

## 2011-08-08 NOTE — ED Notes (Signed)
Pt encouraged to continue drinking CT contrast

## 2011-08-08 NOTE — ED Provider Notes (Cosign Needed)
History     CSN: [id number]  Arrival date & time 08/08/11  2019   First MD Initiated Contact with Patient 08/08/11 2022      Chief Complaint  Patient presents with  . Abdominal Pain    (Consider location/radiation/quality/duration/timing/severity/associated sxs/prior treatment) HPI Patient with diarrhea began Sunday night with frequent loose stools.  Nausea, no vomiting, no fever, Bilateral lower abdominal pain.  S/p bowel resection for colon cancer.  No recent antibiotics.  PMD Dr. Para March.  Deneis rectal bleeding but states stool is dark.  Patient had only sprite and immodium by mouth today.  Past Medical History  Diagnosis Date  . CAD (coronary artery disease)     s/p PTCA RCA (Dr. Juliann Pares)  . PVD (peripheral vascular disease)   . Atrial fibrillation   . Hypertension   . Amenia   . COPD (chronic obstructive pulmonary disease) 2008    emphysemaous with asthmatic component-FEV 2008 34%  . Rectal bleeding   . SBO (small bowel obstruction)   . MRSA (methicillin resistant Staphylococcus aureus)   . Nontoxic multinodular goiter   . History of esophagitis     gastritis, duodenitis.  EGD neg 10/20/02  . Tachycardia   . Hypercholesterolemia   . Arteriovenous malformation of duodenum   . tobacco abuse, history of   . atherosclerosis   . Leg cramps     with mild claudication  . Sinus tachycardia   . Hip fracture     right, pins 2009  . Diverticulosis of colon   . Hx of colonic polyps   . Gall stones 10/1997    abd [place]  . Goiter     thyroid US- bx 06/26/03  . Femoral neck fracture     ORIF, Dr. Yisroel Ramming 02/20/08  . Compression fracture of vertebra     T4/ kyphoplasty, hosp 12/31-06/02/10  . Colon cancer     Colonoscopy wnl 10/21/02  . Carcinoma of colon     s/p resection  . Skin cancer     L forearm 2012, per derm    Past Surgical History  Procedure Date  . Colon resection 05/1997    cancer and carcinoid tumor  . Ileostomy 05/1997    takedown 7/99  . Cardiac  catheterization     stent/ ER, ok LE's 10/00. PV airtigraogy- stent 10/00  . Angioplasty 7/00    R CA  . Abdominal surgery     incarcerated ventral hernia, MRSA 06/28/00  . Cataract extraction 2004    bilateral  . Total abdominal hysterectomy     Family History  Problem Relation Age of Onset  . Kidney disease Mother     died of kidney failure  . Cancer Father     stomach    History  Substance Use Topics  . Smoking status: Former Games developer  . Smokeless tobacco: Never Used   Comment: quit 10-11 years ago  . Alcohol Use: No    OB History    Grav Para Term Preterm Abortions TAB SAB Ect Mult Living                  Review of Systems  Constitutional: Negative for fever, chills, diaphoresis and unexpected weight change.  Respiratory: Negative for shortness of breath.   Cardiovascular: Negative for chest pain.  All other systems reviewed and are negative.    Allergies  Sulfonamide derivatives and Symbicort  Home Medications   Current Outpatient Rx  Name Route Sig Dispense Refill  . ACETAMINOPHEN 500 MG  PO TABS  Take one as needed as instructed on the bottle     . ALBUTEROL SULFATE HFA 108 (90 BASE) MCG/ACT IN AERS  One puff by mouth every 2-4 hours as needed    . ASPIRIN 81 MG PO TABS Oral Take 81 mg by mouth daily.      . ATENOLOL 25 MG PO TABS Oral Take 1 tablet (25 mg total) by mouth daily. 90 tablet 3  . BIOTIN 1000 MCG PO TABS Oral Take 1,000 mcg by mouth daily.      Marland Kitchen CALCIUM 1500 MG PO TABS Oral Take 1,500 mg by mouth daily.      Marland Kitchen VITAMIN D 1000 UNITS PO TABS Oral Take 1,000 Units by mouth daily.      Marland Kitchen VITAMIN B-12 500 MCG PO LOZG Oral Take by mouth daily.      Marland Kitchen FERROUS FUMARATE 325 (106 FE) MG PO TABS Oral Take by mouth daily.      Marland Kitchen FLUTICASONE-SALMETEROL 250-50 MCG/DOSE IN AEPB Inhalation Inhale 1 puff into the lungs 2 (two) times daily. 60 each 11  . GRAPE SEED PO  Take 2 by mouth daily     . CENTRUM SILVER PO Oral Take by mouth daily.      Marland Kitchen  NITROGLYCERIN 0.4 MG SL SUBL  Place one under tongue every 5 minutes x 3 as needed 25 tablet 1  . NON FORMULARY  Use 2.5 liters daily as needed    . FISH OIL 1000 MG PO CAPS Oral Take by mouth daily.      Marland Kitchen OMEPRAZOLE 20 MG PO CPDR Oral Take 20 mg by mouth daily.        BP 134/101  Pulse 129  Temp(Src) 98.4 F (36.9 C) (Oral)  Resp 15  SpO2 93%  Physical Exam  Nursing note and vitals reviewed. Constitutional: She appears well-developed and well-nourished.  HENT:  Head: Normocephalic and atraumatic.  Right Ear: External ear normal.  Left Ear: External ear normal.  Mouth/Throat: Mucous membranes are dry.    ED Course  Procedures (including critical care time)   Labs Reviewed  CBC  DIFFERENTIAL  COMPREHENSIVE METABOLIC PANEL  LIPASE, BLOOD  URINALYSIS, ROUTINE W REFLEX MICROSCOPIC  PROTIME-INR  APTT  OCCULT BLOOD X 1 CARD TO LAB, STOOL   No results found.   No diagnosis found.   Date: 08/08/2011  Rate: 144  Rhythm: atrial fibrillation  QRS Axis: left  Intervals: fib/flutter  ST/T Wave abnormalities: nonspecific ST/T changes  Conduction Disutrbances:right bundle branch block and left anterior fascicular block  Narrative Interpretation:   Old EKG Reviewed: changes noted and rate increased previously nsr  Results for orders placed during the hospital encounter of 08/08/11  CBC      Component Value Range   WBC 7.6  4.0 - 10.5 (K/uL)   RBC 4.35  3.87 - 5.11 (MIL/uL)   Hemoglobin 14.7  12.0 - 15.0 (g/dL)   HCT 16.1  09.6 - 04.5 (%)   MCV 100.9 (*) 78.0 - 100.0 (fL)   MCH 33.8  26.0 - 34.0 (pg)   MCHC 33.5  30.0 - 36.0 (g/dL)   RDW 40.9  81.1 - 91.4 (%)   Platelets 116 (*) 150 - 400 (K/uL)  DIFFERENTIAL      Component Value Range   Neutrophils Relative 78 (*) 43 - 77 (%)   Neutro Abs 6.0  1.7 - 7.7 (K/uL)   Lymphocytes Relative 11 (*) 12 - 46 (%)   Lymphs Abs  0.8  0.7 - 4.0 (K/uL)   Monocytes Relative 11  3 - 12 (%)   Monocytes Absolute 0.8  0.1 - 1.0  (K/uL)   Eosinophils Relative 0  0 - 5 (%)   Eosinophils Absolute 0.0  0.0 - 0.7 (K/uL)   Basophils Relative 0  0 - 1 (%)   Basophils Absolute 0.0  0.0 - 0.1 (K/uL)  COMPREHENSIVE METABOLIC PANEL      Component Value Range   Sodium 134 (*) 135 - 145 (mEq/L)   Potassium 3.8  3.5 - 5.1 (mEq/L)   Chloride 101  96 - 112 (mEq/L)   CO2 24  19 - 32 (mEq/L)   Glucose, Bld 103 (*) 70 - 99 (mg/dL)   BUN 19  6 - 23 (mg/dL)   Creatinine, Ser 1.47  0.50 - 1.10 (mg/dL)   Calcium 8.8  8.4 - 82.9 (mg/dL)   Total Protein 6.2  6.0 - 8.3 (g/dL)   Albumin 3.1 (*) 3.5 - 5.2 (g/dL)   AST 25  0 - 37 (U/L)   ALT 17  0 - 35 (U/L)   Alkaline Phosphatase 62  39 - 117 (U/L)   Total Bilirubin 0.9  0.3 - 1.2 (mg/dL)   GFR calc non Af Amer 46 (*) >90 (mL/min)   GFR calc Af Amer 53 (*) >90 (mL/min)  LIPASE, BLOOD      Component Value Range   Lipase 20  11 - 59 (U/L)  URINALYSIS, ROUTINE W REFLEX MICROSCOPIC      Component Value Range   Color, Urine YELLOW  YELLOW    APPearance CLOUDY (*) CLEAR    Specific Gravity, Urine 1.013  1.005 - 1.030    pH 6.0  5.0 - 8.0    Glucose, UA NEGATIVE  NEGATIVE (mg/dL)   Hgb urine dipstick MODERATE (*) NEGATIVE    Bilirubin Urine NEGATIVE  NEGATIVE    Ketones, ur NEGATIVE  NEGATIVE (mg/dL)   Protein, ur 562 (*) NEGATIVE (mg/dL)   Urobilinogen, UA 0.2  0.0 - 1.0 (mg/dL)   Nitrite NEGATIVE  NEGATIVE    Leukocytes, UA NEGATIVE  NEGATIVE   PROTIME-INR      Component Value Range   Prothrombin Time 14.9  11.6 - 15.2 (seconds)   INR 1.15  0.00 - 1.49   APTT      Component Value Range   aPTT 31  24 - 37 (seconds)  URINE MICROSCOPIC-ADD ON      Component Value Range   Squamous Epithelial / LPF MANY (*) RARE    WBC, UA 0-2  <3 (WBC/hpf)   RBC / HPF 3-6  <3 (RBC/hpf)   Bacteria, UA FEW (*) RARE    Casts GRANULAR CAST (*) NEGATIVE      MDM    Patient received IV normal saline total of 1 L and heart rate has decreased to 100 and appears to be in a normal sinus  rhythm.  Patient with febrile illness with diarrhea and significant tachycardia which has improved with IV normal saline. Plan admission for further hydration. CT scan is ordered and pending.  Patient's care discussed with Dr. Lavera Guise. Patient with febrile illness and diarrhea, I discussed is Dr. Lavera Guise, and we feel that the patient does not need to have a CT scan. The CT scan is discontinued. The patient is being put in for a telemetry bed. Hilario Quarry, MD 08/08/11 1308  Hilario Quarry, MD 08/08/11 2303

## 2011-08-09 ENCOUNTER — Other Ambulatory Visit: Payer: Self-pay

## 2011-08-09 LAB — COMPREHENSIVE METABOLIC PANEL
Albumin: 2.5 g/dL — ABNORMAL LOW (ref 3.5–5.2)
Alkaline Phosphatase: 55 U/L (ref 39–117)
BUN: 18 mg/dL (ref 6–23)
Potassium: 3.5 mEq/L (ref 3.5–5.1)
Sodium: 137 mEq/L (ref 135–145)
Total Protein: 5.2 g/dL — ABNORMAL LOW (ref 6.0–8.3)

## 2011-08-09 LAB — MAGNESIUM: Magnesium: 1.6 mg/dL (ref 1.5–2.5)

## 2011-08-09 LAB — CBC
MCH: 33.7 pg (ref 26.0–34.0)
Platelets: 101 10*3/uL — ABNORMAL LOW (ref 150–400)
RBC: 3.8 MIL/uL — ABNORMAL LOW (ref 3.87–5.11)
WBC: 5.9 10*3/uL (ref 4.0–10.5)

## 2011-08-09 LAB — MRSA PCR SCREENING: MRSA by PCR: POSITIVE — AB

## 2011-08-09 MED ORDER — FLUTICASONE-SALMETEROL 250-50 MCG/DOSE IN AEPB
1.0000 | INHALATION_SPRAY | Freq: Two times a day (BID) | RESPIRATORY_TRACT | Status: DC
  Administered 2011-08-09 – 2011-08-15 (×12): 1 via RESPIRATORY_TRACT
  Filled 2011-08-09: qty 14

## 2011-08-09 MED ORDER — IPRATROPIUM BROMIDE 0.02 % IN SOLN
0.5000 mg | Freq: Four times a day (QID) | RESPIRATORY_TRACT | Status: DC
  Administered 2011-08-09 – 2011-08-10 (×4): 0.5 mg via RESPIRATORY_TRACT
  Filled 2011-08-09 (×4): qty 2.5

## 2011-08-09 MED ORDER — MORPHINE SULFATE 2 MG/ML IJ SOLN
0.5000 mg | INTRAMUSCULAR | Status: DC | PRN
  Administered 2011-08-10 – 2011-08-12 (×2): 0.5 mg via INTRAVENOUS
  Filled 2011-08-09 (×2): qty 1

## 2011-08-09 MED ORDER — DILTIAZEM HCL 100 MG IV SOLR
2.5000 mg/h | INTRAVENOUS | Status: DC
  Filled 2011-08-09: qty 100

## 2011-08-09 MED ORDER — DEXTROSE-NACL 5-0.9 % IV SOLN
INTRAVENOUS | Status: DC
  Administered 2011-08-09 (×2): via INTRAVENOUS

## 2011-08-09 MED ORDER — METOPROLOL TARTRATE 1 MG/ML IV SOLN
5.0000 mg | Freq: Once | INTRAVENOUS | Status: AC
  Administered 2011-08-09: 5 mg via INTRAVENOUS
  Filled 2011-08-09: qty 5

## 2011-08-09 MED ORDER — DILTIAZEM HCL 30 MG PO TABS
30.0000 mg | ORAL_TABLET | Freq: Four times a day (QID) | ORAL | Status: DC
  Administered 2011-08-10 – 2011-08-11 (×7): 30 mg via ORAL
  Filled 2011-08-09 (×10): qty 1

## 2011-08-09 MED ORDER — MUPIROCIN 2 % EX OINT
TOPICAL_OINTMENT | Freq: Two times a day (BID) | CUTANEOUS | Status: DC
  Administered 2011-08-09 – 2011-08-11 (×5): via NASAL
  Filled 2011-08-09 (×2): qty 22

## 2011-08-09 MED ORDER — DILTIAZEM HCL 100 MG IV SOLR
2.5000 mg/h | INTRAVENOUS | Status: DC
  Administered 2011-08-09: 5 mg/h via INTRAVENOUS
  Filled 2011-08-09: qty 100

## 2011-08-09 MED ORDER — LEVALBUTEROL HCL 0.63 MG/3ML IN NEBU
0.6300 mg | INHALATION_SOLUTION | Freq: Four times a day (QID) | RESPIRATORY_TRACT | Status: DC
  Administered 2011-08-09 – 2011-08-10 (×4): 0.63 mg via RESPIRATORY_TRACT
  Filled 2011-08-09 (×10): qty 3

## 2011-08-09 MED ORDER — ONDANSETRON HCL 4 MG/2ML IJ SOLN
4.0000 mg | Freq: Four times a day (QID) | INTRAMUSCULAR | Status: DC | PRN
  Administered 2011-08-11: 4 mg via INTRAVENOUS
  Filled 2011-08-09 (×2): qty 2

## 2011-08-09 MED ORDER — ONDANSETRON HCL 4 MG PO TABS: 4.0000 mg | ORAL_TABLET | Freq: Four times a day (QID) | ORAL | Status: DC | PRN

## 2011-08-09 MED ORDER — ENOXAPARIN SODIUM 40 MG/0.4ML ~~LOC~~ SOLN
40.0000 mg | Freq: Every day | SUBCUTANEOUS | Status: DC
  Administered 2011-08-09 – 2011-08-11 (×3): 40 mg via SUBCUTANEOUS
  Filled 2011-08-09 (×3): qty 0.4

## 2011-08-09 NOTE — Progress Notes (Signed)
Central monitoring throughout hospital went down around 1630 and returned around 1800.  When monitoring continued pt heart rate elevated in the 160's-170's in Afib with RVR.  Pt asymptomatic at the time with stable vitals.  Stat EKG performed to confirm rhythm.  MD has been notified.  New orders given.  Will continue to monitor. Nino Glow RN

## 2011-08-09 NOTE — H&P (Signed)
Abigail [name] is an 78 y.o. female.   Chief Complaint: Nausea and Vomiting HPI: 78 Yo with multiple medical problems who was at a reception party on Sunday with her husband and they both ate there. Since that night, they have both ben having nausea, vomiting and Diarrhea. She has been weak and dry. She denies fever or chills. No blood in her stool. No other person reported any illness from the group.  Past Medical History  Diagnosis Date  . CAD (coronary artery disease)     s/p PTCA RCA (Dr. Juliann Pares)  . PVD (peripheral vascular disease)   . Atrial fibrillation   . Hypertension   . Amenia   . COPD (chronic obstructive pulmonary disease) 2008    emphysemaous with asthmatic component-FEV 2008 34%  . Rectal bleeding   . SBO (small bowel obstruction)   . MRSA (methicillin resistant Staphylococcus aureus)   . Nontoxic multinodular goiter   . History of esophagitis     gastritis, duodenitis.  EGD neg 10/20/02  . Tachycardia   . Hypercholesterolemia   . Arteriovenous malformation of duodenum   . tobacco abuse, history of   . atherosclerosis   . Leg cramps     with mild claudication  . Sinus tachycardia   . Hip fracture     right, pins 2009  . Diverticulosis of colon   . Hx of colonic polyps   . Gall stones 10/1997    abd [place]  . Goiter     thyroid US- bx 06/26/03  . Femoral neck fracture     ORIF, Dr. Yisroel Ramming 02/20/08  . Compression fracture of vertebra     T4/ kyphoplasty, hosp 12/31-06/02/10  . Colon cancer     Colonoscopy wnl 10/21/02  . Carcinoma of colon     s/p resection  . Skin cancer     L forearm 2012, per derm    Past Surgical History  Procedure Date  . Colon resection 05/1997    cancer and carcinoid tumor  . Ileostomy 05/1997    takedown 7/99  . Cardiac catheterization     stent/ ER, ok LE's 10/00. PV airtigraogy- stent 10/00  . Angioplasty 7/00    R CA  . Abdominal surgery     incarcerated ventral hernia, MRSA 06/28/00  . Cataract extraction 2004    bilateral  .  Total abdominal hysterectomy     Family History  Problem Relation Age of Onset  . Kidney disease Mother     died of kidney failure  . Cancer Father     stomach   Social History:  reports that she has quit smoking. She has never used smokeless tobacco. She reports that she does not drink alcohol or use illicit drugs.  Allergies:  Allergies  Allergen Reactions  . Sulfonamide Derivatives     REACTION: unspecified  . Symbicort     REACTION: STATES FEELS AS IF HAVING HEART ATTACK    Medications Prior to Admission  Medication Dose Route Frequency Provider Last Rate Last Dose  . dextrose 5 %-0.9 % sodium chloride infusion   Intravenous Continuous Rometta Emery, MD 100 mL/hr at 08/09/11 0310    . enoxaparin (LOVENOX) injection 40 mg  40 mg Subcutaneous Daily Rometta Emery, MD      . Fluticasone-Salmeterol (ADVAIR) 250-50 MCG/DOSE inhaler 1 puff  1 puff Inhalation BID Rometta Emery, MD      . HYDROmorphone (DILAUDID) injection 0.5 mg  0.5 mg Intravenous Once Danielle S  Beever, MD   0.5 mg at 08/08/11 2125  . iohexol (OMNIPAQUE) 300 MG/ML solution 20 mL  20 mL Oral Q1 Hr x 2 Clanford L Johnson, MD   20 mL at 08/08/11 2043  . ipratropium (ATROVENT) nebulizer solution 0.5 mg  0.5 mg Nebulization Q6H Rometta Emery, MD      . levalbuterol (XOPENEX) nebulizer solution 0.63 mg  0.63 mg Nebulization Q6H Rometta Emery, MD      . morphine 2 MG/ML injection 0.5 mg  0.5 mg Intravenous Q4H PRN Rometta Emery, MD      . ondansetron (ZOFRAN) injection 4 mg  4 mg Intravenous Once Hilario Quarry, MD   4 mg at 08/08/11 2124  . ondansetron (ZOFRAN) tablet 4 mg  4 mg Oral Q6H PRN Rometta Emery, MD       Or  . ondansetron (ZOFRAN) injection 4 mg  4 mg Intravenous Q6H PRN Rometta Emery, MD      . sodium chloride 0.9 % bolus 500 mL  500 mL Intravenous Once Hilario Quarry, MD   500 mL at 08/08/11 2059   Medications Prior to Admission  Medication Sig Dispense Refill  . acetaminophen  (TYLENOL) 500 MG tablet Take one as needed as instructed on the bottle       . albuterol (VENTOLIN HFA) 108 (90 BASE) MCG/ACT inhaler One puff by mouth every 2-4 hours as needed      . aspirin 81 MG tablet Take 81 mg by mouth daily.        Marland Kitchen atenolol (TENORMIN) 25 MG tablet Take 1 tablet (25 mg total) by mouth daily.  90 tablet  3  . Biotin 1000 MCG tablet Take 1,000 mcg by mouth daily.        . Calcium 1500 MG tablet Take 1,500 mg by mouth daily.        . cholecalciferol (VITAMIN D) 1000 UNITS tablet Take 1,000 Units by mouth daily.        . Cyanocobalamin (VITAMIN B-12) 500 MCG LOZG Take by mouth daily.        . ferrous fumarate (HEMOCYTE) 325 (106 FE) MG TABS Take by mouth daily.        . Fluticasone-Salmeterol (ADVAIR DISKUS) 250-50 MCG/DOSE AEPB Inhale 1 puff into the lungs 2 (two) times daily.  60 each  11  . GRAPE SEED PO Take 2 by mouth daily       . Multiple Vitamins-Minerals (CENTRUM SILVER PO) Take by mouth daily.        . nitroGLYCERIN (NITROSTAT) 0.4 MG SL tablet Place one under tongue every 5 minutes x 3 as needed  25 tablet  1  . NON FORMULARY Use 2.5 liters daily as needed      . Omega-3 Fatty Acids (FISH OIL) 1000 MG CAPS Take by mouth daily.        Marland Kitchen omeprazole (PRILOSEC) 20 MG capsule Take 20 mg by mouth daily.          Results for orders placed during the hospital encounter of 08/08/11 (from the past 48 hour(s))  CBC     Status: Abnormal   Collection Time   08/08/11  8:37 PM      Component Value Range Comment   WBC 7.6  4.0 - 10.5 (K/uL)    RBC 4.35  3.87 - 5.11 (MIL/uL)    Hemoglobin 14.7  12.0 - 15.0 (g/dL)    HCT 16.1  09.6 - 04.5 (%)  MCV 100.9 (*) 78.0 - 100.0 (fL)    MCH 33.8  26.0 - 34.0 (pg)    MCHC 33.5  30.0 - 36.0 (g/dL)    RDW 95.6  21.3 - 08.6 (%)    Platelets 116 (*) 150 - 400 (K/uL) PLATELET COUNT CONFIRMED BY SMEAR  DIFFERENTIAL     Status: Abnormal   Collection Time   08/08/11  8:37 PM      Component Value Range Comment   Neutrophils Relative  78 (*) 43 - 77 (%)    Neutro Abs 6.0  1.7 - 7.7 (K/uL)    Lymphocytes Relative 11 (*) 12 - 46 (%)    Lymphs Abs 0.8  0.7 - 4.0 (K/uL)    Monocytes Relative 11  3 - 12 (%)    Monocytes Absolute 0.8  0.1 - 1.0 (K/uL)    Eosinophils Relative 0  0 - 5 (%)    Eosinophils Absolute 0.0  0.0 - 0.7 (K/uL)    Basophils Relative 0  0 - 1 (%)    Basophils Absolute 0.0  0.0 - 0.1 (K/uL)   COMPREHENSIVE METABOLIC PANEL     Status: Abnormal   Collection Time   08/08/11  8:37 PM      Component Value Range Comment   Sodium 134 (*) 135 - 145 (mEq/L)    Potassium 3.8  3.5 - 5.1 (mEq/L)    Chloride 101  96 - 112 (mEq/L)    CO2 24  19 - 32 (mEq/L)    Glucose, Bld 103 (*) 70 - 99 (mg/dL)    BUN 19  6 - 23 (mg/dL)    Creatinine, Ser 5.78  0.50 - 1.10 (mg/dL)    Calcium 8.8  8.4 - 10.5 (mg/dL)    Total Protein 6.2  6.0 - 8.3 (g/dL)    Albumin 3.1 (*) 3.5 - 5.2 (g/dL)    AST 25  0 - 37 (U/L)    ALT 17  0 - 35 (U/L)    Alkaline Phosphatase 62  39 - 117 (U/L)    Total Bilirubin 0.9  0.3 - 1.2 (mg/dL)    GFR calc non Af Amer 46 (*) >90 (mL/min)    GFR calc Af Amer 53 (*) >90 (mL/min)   LIPASE, BLOOD     Status: Normal   Collection Time   08/08/11  8:37 PM      Component Value Range Comment   Lipase 20  11 - 59 (U/L)   PROTIME-INR     Status: Normal   Collection Time   08/08/11  8:37 PM      Component Value Range Comment   Prothrombin Time 14.9  11.6 - 15.2 (seconds)    INR 1.15  0.00 - 1.49    APTT     Status: Normal   Collection Time   08/08/11  8:37 PM      Component Value Range Comment   aPTT 31  24 - 37 (seconds)   URINALYSIS, ROUTINE W REFLEX MICROSCOPIC     Status: Abnormal   Collection Time   08/08/11  9:20 PM      Component Value Range Comment   Color, Urine YELLOW  YELLOW     APPearance CLOUDY (*) CLEAR     Specific Gravity, Urine 1.013  1.005 - 1.030     pH 6.0  5.0 - 8.0     Glucose, UA NEGATIVE  NEGATIVE (mg/dL)    Hgb urine dipstick MODERATE (*) NEGATIVE  Bilirubin Urine  NEGATIVE  NEGATIVE     Ketones, ur NEGATIVE  NEGATIVE (mg/dL)    Protein, ur 191 (*) NEGATIVE (mg/dL)    Urobilinogen, UA 0.2  0.0 - 1.0 (mg/dL)    Nitrite NEGATIVE  NEGATIVE     Leukocytes, UA NEGATIVE  NEGATIVE    URINE MICROSCOPIC-ADD ON     Status: Abnormal   Collection Time   08/08/11  9:20 PM      Component Value Range Comment   Squamous Epithelial / LPF MANY (*) RARE     WBC, UA 0-2  <3 (WBC/hpf)    RBC / HPF 3-6  <3 (RBC/hpf)    Bacteria, UA FEW (*) RARE     Casts GRANULAR CAST (*) NEGATIVE     No results found.  Review of Systems  Constitutional: Positive for malaise/fatigue.  HENT: Negative.   Eyes: Negative.   Respiratory: Negative.   Cardiovascular: Negative.   Gastrointestinal: Positive for nausea, vomiting, abdominal pain and diarrhea. Negative for heartburn, constipation, blood in stool and melena.  Genitourinary: Negative.   Musculoskeletal: Negative.   Skin: Negative.   Neurological: Positive for weakness.  Endo/Heme/Allergies: Negative.   Psychiatric/Behavioral: Negative.     Blood pressure 108/83, pulse 96, temperature 98 F (36.7 C), temperature source Oral, resp. rate 18, height 5\' 4"  (1.626 m), weight 56.518 kg (124 lb 9.6 oz), SpO2 95.00%. Physical Exam  Constitutional: She is oriented to person, place, and time. She appears well-developed and well-nourished.  HENT:  Head: Normocephalic and atraumatic.  Right Ear: External ear normal.  Left Ear: External ear normal.  Nose: Nose normal.  Mouth/Throat: Oropharynx is clear and moist.  Eyes: Conjunctivae and EOM are normal. Pupils are equal, round, and reactive to light.  Neck: Normal range of motion. Neck supple.  Cardiovascular: Normal rate, regular rhythm, normal heart sounds and intact distal pulses.   Respiratory: Effort normal and breath sounds normal.  GI: Soft. Bowel sounds are normal.  Musculoskeletal: Normal range of motion.  Neurological: She is alert and oriented to person, place, and  time. She has normal reflexes.  Skin: Skin is warm and dry.  Psychiatric: She has a normal mood and affect. Her behavior is normal. Judgment and thought content normal.     Assessment/Plan 1. Acute gastroenteritis: symptoms are getting better but patient continues to be dehydrated. Will admit for observation, hydrate cautiously due to heart disease. Correct electrolyte imbalances. 2. HTN: Home medications if BP allows 3. Atrial fibrillation: had some RVR initially now controlled 4. GERD: PPI  Abdinasir Spadafore,LAWAL 08/09/2011, 4:08 AM

## 2011-08-09 NOTE — Progress Notes (Signed)
Subjective: Still having diarrhea, but slightly better. Grandson and son both have same illness. No vomiting, kept down clears,  IV hydration, still feels weak  Objective: Vital signs in last 24 hours: Temp:  [98 F (36.7 C)-99 F (37.2 C)] 98.7 F (37.1 C) (03/13 2032) Pulse Rate:  [84-102] 95  (03/13 2032) Resp:  [13-22] 18  (03/13 2032) BP: (108-152)/(42-83) 141/59 mmHg (03/13 2032) SpO2:  [92 %-97 %] 95 % (03/13 2032) Weight:  [56.518 kg (124 lb 9.6 oz)] 56.518 kg (124 lb 9.6 oz) (03/13 0239) Weight change:  Last BM Date: 08/09/11  Intake/Output from previous day: 03/12 0701 - 03/13 0700 In: -  Out: 100 [Urine:100]    Physical Exam: General: Alert, awake, oriented x3, in no acute distress. HEENT: No bruits, no goiter. Heart: Regular rate and rhythm, without murmurs, rubs, gallops. Lungs: Clear to auscultation bilaterally. Abdomen: Soft, nontender, nondistended, positive bowel sounds. Extremities: No clubbing cyanosis or edema with positive pedal pulses. Neuro: Grossly intact, nonfocal.  Lab Results: Basic Metabolic Panel:  Basename 08/09/11 0650 08/08/11 2037  NA 137 134*  K 3.5 3.8  CL 107 101  CO2 22 24  GLUCOSE 104* 103*  BUN 18 19  CREATININE 0.97 1.05  CALCIUM 8.2* 8.8  MG 1.6 --  PHOS -- --   Liver Function Tests:  [facility] 08/09/11 0650 08/08/11 2037  AST 27 25  ALT 17 17  ALKPHOS 55 62  BILITOT 0.6 0.9  PROT 5.2* 6.2  ALBUMIN 2.5* 3.1*    Basename 08/08/11 2037  LIPASE 20  AMYLASE --   No results found for this basename: AMMONIA:2 in the last 72 hours CBC:  Basename 08/09/11 0650 08/08/11 2037  WBC 5.9 7.6  NEUTROABS -- 6.0  HGB 12.8 14.7  HCT 38.2 43.9  MCV 100.5* 100.9*  PLT 101* 116*    Basename 08/09/11 0650  TSH 0.964  T4TOTAL --  FREET4 --  T3FREE --  THYROIDAB --   Urinalysis:  Basename 08/08/11 2120  COLORURINE YELLOW  LABSPEC 1.013  PHURINE 6.0  GLUCOSEU NEGATIVE  HGBUR MODERATE*  BILIRUBINUR NEGATIVE    KETONESUR NEGATIVE  PROTEINUR 100*  UROBILINOGEN 0.2  NITRITE NEGATIVE  LEUKOCYTESUR NEGATIVE    Recent Results (from the past 240 hour(s))  MRSA PCR SCREENING     Status: Abnormal   Collection Time   08/09/11  8:50 AM      Component Value Range Status Comment   MRSA by PCR POSITIVE (*) NEGATIVE  Final   CLOSTRIDIUM DIFFICILE BY PCR     Status: Normal   Collection Time   08/09/11  4:32 PM      Component Value Range Status Comment   C difficile by pcr NEGATIVE  NEGATIVE  Final     Studies/Results: No results found.  Medications: Scheduled Meds:   . diltiazem  30 mg Oral Q6H  . enoxaparin  40 mg Subcutaneous Daily  . Fluticasone-Salmeterol  1 puff Inhalation BID  . ipratropium  0.5 mg Nebulization Q6H  . levalbuterol  0.63 mg Nebulization Q6H  . metoprolol  5 mg Intravenous Once  . mupirocin ointment   Nasal BID   Continuous Infusions:   . diltiazem (CARDIZEM) infusion 2.5 mg/hr (08/09/11 2218)  . DISCONTD: dextrose 5 % and 0.9% NaCl 100 mL/hr at 08/09/11 1832  . DISCONTD: diltiazem (CARDIZEM) infusion 5 mg/hr (08/09/11 1951)   PRN Meds:.morphine, ondansetron (ZOFRAN) IV, ondansetron  Assessment/Plan:  Principal Problem:  *Gastroenteritis Active Problems:  HYPERTENSION, BENIGN  CAD,  NATIVE VESSEL  Atrial fibrillation  REFLUX GASTRITIS  Plan: 1. Gastroenteritis: I suspect this may be rotovirus, given sick contacts and symptoms with dehydration, continue conservative management, will check rotovirus PCR for ID monitoring, may be food borne illness because she did attend a firefighters picnic over the weekend, but no on else who attended is reportedly sick. Stool sent for cdiff PCR and culture.   2. A-Fib: Rates 120-130s on admission but resolved on their own, on tele, see earlier nursing note from late entry, AFib with RVR now: place ono fixed dose of dilt IV and gave one dose of IV lopressor, probably from stress of GI illness, will monitor for HD changes, no signs  of ischemia, CP or other compliactions, pressure is good.Ok to keep in tele for now since stable, i cut back ion IV fluid, caution with volume overload wile in A-fib to avoid CHF exac. Will check BMET and electrolytes in AM.    LOS: 1 day   Rhyann Berton [facility] 08/09/2011, 11:01 PM

## 2011-08-10 ENCOUNTER — Other Ambulatory Visit: Payer: Self-pay

## 2011-08-10 LAB — CBC
MCH: 33.3 pg (ref 26.0–34.0)
MCHC: 33.1 g/dL (ref 30.0–36.0)
MCV: 100.5 fL — ABNORMAL HIGH (ref 78.0–100.0)
Platelets: 111 10*3/uL — ABNORMAL LOW (ref 150–400)
RDW: 12.9 % (ref 11.5–15.5)
WBC: 6.6 10*3/uL (ref 4.0–10.5)

## 2011-08-10 LAB — BASIC METABOLIC PANEL
Calcium: 9 mg/dL (ref 8.4–10.5)
Chloride: 105 mEq/L (ref 96–112)
Creatinine, Ser: 0.8 mg/dL (ref 0.50–1.10)
GFR calc Af Amer: 74 mL/min — ABNORMAL LOW (ref >90)
GFR calc non Af Amer: 63 mL/min — ABNORMAL LOW (ref >90)

## 2011-08-10 MED ORDER — IPRATROPIUM BROMIDE 0.02 % IN SOLN
0.5000 mg | Freq: Three times a day (TID) | RESPIRATORY_TRACT | Status: DC
  Administered 2011-08-10 – 2011-08-15 (×14): 0.5 mg via RESPIRATORY_TRACT
  Filled 2011-08-10 (×14): qty 2.5

## 2011-08-10 MED ORDER — METOPROLOL SUCCINATE ER 50 MG PO TB24
50.0000 mg | ORAL_TABLET | Freq: Every day | ORAL | Status: DC
  Administered 2011-08-11 – 2011-08-15 (×4): 50 mg via ORAL
  Filled 2011-08-10 (×5): qty 1

## 2011-08-10 MED ORDER — DEXTROSE 5 % IV SOLN
5.0000 mg/h | INTRAVENOUS | Status: DC
  Administered 2011-08-10 – 2011-08-11 (×2): 5 mg/h via INTRAVENOUS
  Filled 2011-08-10 (×2): qty 100

## 2011-08-10 MED ORDER — LEVALBUTEROL HCL 0.63 MG/3ML IN NEBU
0.6300 mg | INHALATION_SOLUTION | Freq: Three times a day (TID) | RESPIRATORY_TRACT | Status: DC
  Administered 2011-08-10 – 2011-08-15 (×14): 0.63 mg via RESPIRATORY_TRACT
  Filled 2011-08-10 (×18): qty 3

## 2011-08-10 NOTE — Progress Notes (Signed)
Subjective: Still having diarrhea, but slightly better. 1 episode of vomiting, kept down clears,  IV hydration, still feels weak  Objective: Vital signs in last 24 hours: Temp:  [97.3 F (36.3 C)-98.1 F (36.7 C)] 98.1 F (36.7 C) (03/14 2100) Pulse Rate:  [79-124] 124  (03/14 2252) Resp:  [20] 20  (03/14 2100) BP: (101-156)/(47-97) 107/50 mmHg (03/14 2252) SpO2:  [90 %-96 %] 96 % (03/14 2100) FiO2 (%):  [3 %] 3 % (03/14 1900) Weight:  [55.6 kg (122 lb 9.2 oz)] 55.6 kg (122 lb 9.2 oz) (03/14 0515) Weight change: -0.918 kg (-2 lb 0.4 oz) Last BM Date: 08/10/11  Intake/Output from previous day: 03/13 0701 - 03/14 0700 In: 916.9 [P.O.:900; I.V.:16.9] Out: 0     Physical Exam: General: Alert, awake, oriented x3, in no acute distress. HEENT: No bruits, no goiter. Heart: Regular rate and rhythm, without murmurs, rubs, gallops. Lungs: Clear to auscultation bilaterally. Abdomen: Soft, nontender, nondistended, positive bowel sounds. Extremities: No clubbing cyanosis or edema with positive pedal pulses. Neuro: Grossly intact, nonfocal.  Lab Results: Basic Metabolic Panel:  Basename 08/10/11 0453 08/09/11 0650  NA 133* 137  K 3.7 3.5  CL 105 107  CO2 21 22  GLUCOSE 104* 104*  BUN 14 18  CREATININE 0.80 0.97  CALCIUM 9.0 8.2*  MG -- 1.6  PHOS -- --   Liver Function Tests:  [facility] 08/09/11 0650 08/08/11 2037  AST 27 25  ALT 17 17  ALKPHOS 55 62  BILITOT 0.6 0.9  PROT 5.2* 6.2  ALBUMIN 2.5* 3.1*    Basename 08/08/11 2037  LIPASE 20  AMYLASE --   No results found for this basename: AMMONIA:2 in the last 72 hours CBC:  Basename 08/10/11 0453 08/09/11 0650 08/08/11 2037  WBC 6.6 5.9 --  NEUTROABS -- -- 6.0  HGB 14.0 12.8 --  HCT 42.3 38.2 --  MCV 100.5* 100.5* --  PLT 111* 101* --    Basename 08/09/11 0650  TSH 0.964  T4TOTAL --  FREET4 --  T3FREE --  THYROIDAB --   Urinalysis:  Basename 08/08/11 2120  COLORURINE YELLOW  LABSPEC 1.013  PHURINE  6.0  GLUCOSEU NEGATIVE  HGBUR MODERATE*  BILIRUBINUR NEGATIVE  KETONESUR NEGATIVE  PROTEINUR 100*  UROBILINOGEN 0.2  NITRITE NEGATIVE  LEUKOCYTESUR NEGATIVE    Recent Results (from the past 240 hour(s))  URINE CULTURE     Status: Normal (Preliminary result)   Collection Time   08/08/11  9:20 PM      Component Value Range Status Comment   Specimen Description URINE, CATHETERIZED   Final    Special Requests ADDED ON [id number] @2333    Final    Culture  Setup Time [id number]   Final    Colony Count >=100,000 COLONIES/ML   Final    Culture GRAM NEGATIVE RODS   Final    Report Status PENDING   Incomplete   MRSA PCR SCREENING     Status: Abnormal   Collection Time   08/09/11  8:50 AM      Component Value Range Status Comment   MRSA by PCR POSITIVE (*) NEGATIVE  Final   CLOSTRIDIUM DIFFICILE BY PCR     Status: Normal   Collection Time   08/09/11  4:32 PM      Component Value Range Status Comment   C difficile by pcr NEGATIVE  NEGATIVE  Final     Studies/Results: No results found.  Medications: Scheduled Meds:    . diltiazem  30  mg Oral Q6H  . enoxaparin  40 mg Subcutaneous Daily  . Fluticasone-Salmeterol  1 puff Inhalation BID  . ipratropium  0.5 mg Nebulization TID  . levalbuterol  0.63 mg Nebulization TID  . mupirocin ointment   Nasal BID  . DISCONTD: ipratropium  0.5 mg Nebulization Q6H  . DISCONTD: levalbuterol  0.63 mg Nebulization Q6H   Continuous Infusions:    . diltiazem (CARDIZEM) infusion 5 mg/hr (08/10/11 2047)  . DISCONTD: diltiazem (CARDIZEM) infusion Stopped (08/10/11 0030)   PRN Meds:.morphine, ondansetron (ZOFRAN) IV, ondansetron  Assessment/Plan:  Principal Problem:  *Gastroenteritis Active Problems:  HYPERTENSION, BENIGN  CAD, NATIVE VESSEL  Atrial fibrillation  REFLUX GASTRITIS  Plan: 1. Gastroenteritis: I suspect this may be rotovirus, given sick contacts and symptoms with dehydration, continue conservative management, will check rotovirus  PCR for ID monitoring, may be food borne illness because she did attend a firefighters picnic over the weekend, but no on else who attended is reportedly sick. Stool sent for cdiff PCR and culture. FOBT positive, watching Hb.   2. A-Fib: Resolved today. Late A-Fib run called at 8PM set dose of dilt need to start PO Lopressor and Dilt tomorrow for brate control. Probably from stress of GI illness, will monitor for HD changes, no signs of ischemia, CP or other compliactions, pressure is good.Ok to keep in tele for now since stable, i cut back ion IV fluid, caution with volume overload wile in A-fib to avoid CHF exac. Will check BMET and electrolytes in AM.    LOS: 2 days   Abigail [name] [facility] 08/10/2011, 11:52 PM

## 2011-08-10 NOTE — Progress Notes (Signed)
Patient started on Cardizem drip at approx 1950hrs to aid with rhythm conversion.  Patient was found to be in atrial fib with RVR, HR was 130s-140s.  Patient's rhythm converted shortly after starting the drip.  She was transitioned to PO Cardizem and the infusion was discontinued.  Rhythm is NSR in the 80s at the time of the transition. Rhythm has changed back to atrial fib, rate has remained from 90s-110s. Vital signs have remained stable.  Will continue to monitor continuous telemetry and patient condition.  Idolina Primer RN

## 2011-08-11 LAB — CBC
MCHC: 34.3 g/dL (ref 30.0–36.0)
Platelets: 124 10*3/uL — ABNORMAL LOW (ref 150–400)
RDW: 12.8 % (ref 11.5–15.5)
WBC: 6.8 10*3/uL (ref 4.0–10.5)

## 2011-08-11 LAB — URINE CULTURE: Culture  Setup Time: 201303130827

## 2011-08-11 LAB — BASIC METABOLIC PANEL
Calcium: 9.3 mg/dL (ref 8.4–10.5)
GFR calc non Af Amer: 41 mL/min — ABNORMAL LOW (ref >90)
Sodium: 138 mEq/L (ref 135–145)

## 2011-08-11 LAB — MAGNESIUM: Magnesium: 1.8 mg/dL (ref 1.5–2.5)

## 2011-08-11 MED ORDER — ENOXAPARIN SODIUM 30 MG/0.3ML ~~LOC~~ SOLN
30.0000 mg | Freq: Every day | SUBCUTANEOUS | Status: DC
  Administered 2011-08-12 – 2011-08-14 (×3): 30 mg via SUBCUTANEOUS
  Filled 2011-08-11 (×3): qty 0.3

## 2011-08-11 MED ORDER — METOPROLOL TARTRATE 1 MG/ML IV SOLN
5.0000 mg | Freq: Four times a day (QID) | INTRAVENOUS | Status: DC | PRN
  Administered 2011-08-11: 5 mg via INTRAVENOUS
  Filled 2011-08-11: qty 5

## 2011-08-11 MED ORDER — ONDANSETRON HCL 4 MG/2ML IJ SOLN
4.0000 mg | INTRAMUSCULAR | Status: DC | PRN
  Administered 2011-08-11 – 2011-08-12 (×3): 4 mg via INTRAVENOUS
  Filled 2011-08-11 (×2): qty 2

## 2011-08-11 MED ORDER — CHLORHEXIDINE GLUCONATE CLOTH 2 % EX PADS
6.0000 | MEDICATED_PAD | Freq: Every day | CUTANEOUS | Status: DC
  Administered 2011-08-12 – 2011-08-15 (×4): 6 via TOPICAL

## 2011-08-11 MED ORDER — DILTIAZEM HCL 60 MG PO TABS
60.0000 mg | ORAL_TABLET | Freq: Four times a day (QID) | ORAL | Status: DC
  Administered 2011-08-11 – 2011-08-12 (×3): 60 mg via ORAL
  Filled 2011-08-11 (×8): qty 1

## 2011-08-11 MED ORDER — MUPIROCIN 2 % EX OINT
TOPICAL_OINTMENT | Freq: Two times a day (BID) | CUTANEOUS | Status: AC
  Administered 2011-08-11 – 2011-08-13 (×5): via NASAL
  Filled 2011-08-11: qty 22

## 2011-08-11 MED ORDER — DEXTROSE 5 % IN LACTATED RINGERS IV BOLUS
250.0000 mL | Freq: Once | INTRAVENOUS | Status: AC
  Administered 2011-08-11: 250 mL via INTRAVENOUS
  Filled 2011-08-11: qty 500

## 2011-08-11 NOTE — Progress Notes (Signed)
Patient's heart rate still sustaining 120s-130s after starting cardizem drip.  BP 107/50  Still no complaints of chest pain or shortness of breath.  Dr. Elisabeth Pigeon notified who increased cardizem drip to 10mg /hr however after entering order Dr. Phillips Odor called and stated to keep the drip at 5mg /hr and give IV metoprolol prn as ordered.   Will continue to monitor patient.

## 2011-08-11 NOTE — Progress Notes (Signed)
Patient converted back into a-fib heart rate sustaining in the 130s-140s.  BP 114/67.  Pt has no complaints of chest pain or shortness of breath.  Dr. Phillips Odor made aware who re-started patient on cardizem drip.   Will continue to monitor patient.

## 2011-08-12 ENCOUNTER — Inpatient Hospital Stay (HOSPITAL_COMMUNITY): Payer: Medicare Other

## 2011-08-12 LAB — COMPREHENSIVE METABOLIC PANEL
ALT: 14 U/L (ref 0–35)
AST: 16 U/L (ref 0–37)
Albumin: 2.9 g/dL — ABNORMAL LOW (ref 3.5–5.2)
Calcium: 9.7 mg/dL (ref 8.4–10.5)
Chloride: 104 mEq/L (ref 96–112)
Creatinine, Ser: 1 mg/dL (ref 0.50–1.10)
Sodium: 143 mEq/L (ref 135–145)
Total Bilirubin: 1 mg/dL (ref 0.3–1.2)

## 2011-08-12 LAB — CBC
Hemoglobin: 15.4 g/dL — ABNORMAL HIGH (ref 12.0–15.0)
MCH: 34.1 pg — ABNORMAL HIGH (ref 26.0–34.0)
MCV: 98.9 fL (ref 78.0–100.0)
RBC: 4.52 MIL/uL (ref 3.87–5.11)

## 2011-08-12 MED ORDER — DEXTROSE 5 % IV SOLN
1.0000 g | INTRAVENOUS | Status: DC
  Administered 2011-08-12 – 2011-08-15 (×4): 1 g via INTRAVENOUS
  Filled 2011-08-12 (×4): qty 10

## 2011-08-12 MED ORDER — DEXTROSE 5 % IN LACTATED RINGERS IV BOLUS
250.0000 mL | Freq: Once | INTRAVENOUS | Status: AC
  Administered 2011-08-12: 250 mL via INTRAVENOUS

## 2011-08-12 MED ORDER — METOCLOPRAMIDE HCL 5 MG/ML IJ SOLN
10.0000 mg | Freq: Four times a day (QID) | INTRAMUSCULAR | Status: DC | PRN
  Filled 2011-08-12: qty 2

## 2011-08-12 MED ORDER — DILTIAZEM HCL 100 MG IV SOLR
2.5000 mg/h | INTRAVENOUS | Status: DC
  Administered 2011-08-12 – 2011-08-13 (×2): 5 mg/h via INTRAVENOUS
  Filled 2011-08-12 (×2): qty 100

## 2011-08-12 NOTE — Progress Notes (Signed)
Subjective: Better, wanting food again Objective: Vital signs in last 24 hours: Temp:  [98 F (36.7 C)-98.3 F (36.8 C)] 98.2 F (36.8 C) (03/16 0400) Pulse Rate:  [85-92] 92  (03/16 0400) Resp:  [18-20] 20  (03/16 0400) BP: (106-159)/(49-75) 159/75 mmHg (03/16 0400) SpO2:  [95 %-97 %] 97 % (03/16 0400) Weight:  [53.6 kg (118 lb 2.7 oz)] 53.6 kg (118 lb 2.7 oz) (03/16 0400) Weight change: 1.164 kg (2 lb 9.1 oz) Last BM Date: 08/11/11  Intake/Output from previous day: 03/15 0701 - 03/16 0700 In: 710 [P.O.:710] Out: 750 [Urine:750]    Physical Exam: General: Alert, awake, oriented x3, in no acute distress. HEENT: No bruits, no goiter. Heart: Regular rate and rhythm, without murmurs, rubs, gallops. Lungs: decreased breath sounds but clear Abdomen: Soft, nontender, nondistended, positive bowel sounds. Extremities: No clubbing cyanosis or edema with positive pedal pulses. Neuro: Grossly intact, nonfocal.  Lab Results: Basic Metabolic Panel:  Basename 08/11/11 0500 08/10/11 0453  NA 138 133*  K 4.7 3.7  CL 106 105  CO2 25 21  GLUCOSE 115* 104*  BUN 23 14  CREATININE 1.15* 0.80  CALCIUM 9.3 9.0  MG 1.8 --  PHOS -- --    Basename 08/11/11 0500 08/10/11 0453  WBC 6.8 6.6  NEUTROABS -- --  HGB 14.7 14.0  HCT 42.8 42.3  MCV 99.5 100.5*  PLT 124* 111*     Recent Results (from the past 240 hour(s))  URINE CULTURE     Status: Normal   Collection Time   08/08/11  9:20 PM      Component Value Range Status Comment   Specimen Description URINE, CATHETERIZED   Final    Special Requests ADDED ON [id number] @2333    Final    Culture  Setup Time [id number]   Final    Colony Count >=100,000 COLONIES/ML   Final    Culture     Final    Value: CITROBACTER YOUNGAE     KLEBSIELLA PNEUMONIAE   Report Status 08/11/2011 FINAL   Final    Organism ID, Bacteria CITROBACTER YOUNGAE   Final    Organism ID, Bacteria KLEBSIELLA PNEUMONIAE   Final   MRSA PCR SCREENING     Status:  Abnormal   Collection Time   08/09/11  8:50 AM      Component Value Range Status Comment   MRSA by PCR POSITIVE (*) NEGATIVE  Final   CLOSTRIDIUM DIFFICILE BY PCR     Status: Normal   Collection Time   08/09/11  4:32 PM      Component Value Range Status Comment   C difficile by pcr NEGATIVE  NEGATIVE  Final   STOOL CULTURE     Status: Normal (Preliminary result)   Collection Time   08/09/11  4:32 PM      Component Value Range Status Comment   Specimen Description STOOL   Final    Special Requests Normal   Final    Culture Culture reincubated for better growth   Final    Report Status PENDING   Incomplete     Studies/Results: No results found.  Medications: Scheduled Meds:    . Chlorhexidine Gluconate Cloth  6 each Topical Q0600  . dextrose 5% lactated ringers  250 mL Intravenous Once  . diltiazem  60 mg Oral Q6H  . enoxaparin (LOVENOX) injection  30 mg Subcutaneous Daily  . Fluticasone-Salmeterol  1 puff Inhalation BID  . ipratropium  0.5 mg Nebulization TID  .  levalbuterol  0.63 mg Nebulization TID  . metoprolol succinate  50 mg Oral Daily  . mupirocin ointment   Nasal BID  . DISCONTD: diltiazem  30 mg Oral Q6H  . DISCONTD: enoxaparin  40 mg Subcutaneous Daily  . DISCONTD: mupirocin ointment   Nasal BID   Continuous Infusions:    . DISCONTD: diltiazem (CARDIZEM) infusion Stopped (08/11/11 1715)   PRN Meds:.metoprolol, morphine, ondansetron, DISCONTD: ondansetron (ZOFRAN) IV, DISCONTD: ondansetron  Assessment/Plan:  Principal Problem:  *Gastroenteritis Active Problems:  HYPERTENSION, BENIGN  CAD, NATIVE VESSEL  Atrial fibrillation  REFLUX GASTRITIS  Plan: 1. Gastroenteritis: Better. I suspect this may be rotovirus, given sick contacts and symptoms with dehydration, continue conservative management, will check rotovirus PCR for ID monitoring, may be food borne illness because she did attend a firefighters picnic over the weekend, but no on else who attended is  reportedly sick. Stool sent for cdiff PCR and culture. FOBT positive, watching Hb.   2. A-Fib: Transition to oral regimen today-we need to discuss anticoagulation and cardiology evaluation.  3. Possible d/c tomorrow   LOS: 4 days   [facility]

## 2011-08-12 NOTE — Progress Notes (Signed)
Pt. Out of bed to bedside commode several times throughout night however unable to void.  Pt. States that she feels a fullness sensation in lower abdomen.  Bladder scan showed in bladder.  Lenny Pastel made aware and ordered in and out cath.  After in and out cath, clear, yellow urine returned.  Patient tolerated procedure well.  Will continue to monitor patient.

## 2011-08-12 NOTE — Progress Notes (Signed)
Subjective: Vomited all night  Objective: Vital signs in last 24 hours: Temp:  [98 F (36.7 C)-98.3 F (36.8 C)] 98.2 F (36.8 C) (03/16 0400) Pulse Rate:  [85-92] 92  (03/16 0400) Resp:  [18-20] 20  (03/16 0400) BP: (106-159)/(60-75) 159/75 mmHg (03/16 0400) SpO2:  [95 %-97 %] 97 % (03/16 0400) Weight:  [53.6 kg (118 lb 2.7 oz)] 53.6 kg (118 lb 2.7 oz) (03/16 0400) Weight change: 1.164 kg (2 lb 9.1 oz) Last BM Date: 08/11/11  Intake/Output from previous day: 03/15 0701 - 03/16 0700 In: 710 [P.O.:710] Out: 750 [Urine:750]    Physical Exam: General: Alert, awake, oriented x3,looks weaker HEENT: No bruits, no goiter. Heart: IR rhythm, without murmurs, rubs, gallops. Lungs: Clear to auscultation bilaterally. Abdomen: hypoactive bowel sounds, tender to palpation , more distended Extremities: No clubbing cyanosis or edema with positive pedal pulses. Neuro: Grossly intact, nonfocal.    Lab Results: Basic Metabolic Panel:  Basename 08/11/11 0500 08/10/11 0453  NA 138 133*  K 4.7 3.7  CL 106 105  CO2 25 21  GLUCOSE 115* 104*  BUN 23 14  CREATININE 1.15* 0.80  CALCIUM 9.3 9.0  MG 1.8 --  PHOS -- --   CBC:  Basename 08/11/11 0500 08/10/11 0453  WBC 6.8 6.6  NEUTROABS -- --  HGB 14.7 14.0  HCT 42.8 42.3  MCV 99.5 100.5*  PLT 124* 111*    Recent Results (from the past 240 hour(s))  URINE CULTURE     Status: Normal   Collection Time   08/08/11  9:20 PM      Component Value Range Status Comment   Specimen Description URINE, CATHETERIZED   Final    Special Requests ADDED ON [id number] @2333    Final    Culture  Setup Time [id number]   Final    Colony Count >=100,000 COLONIES/ML   Final    Culture     Final    Value: CITROBACTER YOUNGAE     KLEBSIELLA PNEUMONIAE   Report Status 08/11/2011 FINAL   Final    Organism ID, Bacteria CITROBACTER YOUNGAE   Final    Organism ID, Bacteria KLEBSIELLA PNEUMONIAE   Final   MRSA PCR SCREENING     Status: Abnormal   Collection Time   08/09/11  8:50 AM      Component Value Range Status Comment   MRSA by PCR POSITIVE (*) NEGATIVE  Final   CLOSTRIDIUM DIFFICILE BY PCR     Status: Normal   Collection Time   08/09/11  4:32 PM      Component Value Range Status Comment   C difficile by pcr NEGATIVE  NEGATIVE  Final   STOOL CULTURE     Status: Normal (Preliminary result)   Collection Time   08/09/11  4:32 PM      Component Value Range Status Comment   Specimen Description STOOL   Final    Special Requests Normal   Final    Culture Culture reincubated for better growth   Final    Report Status PENDING   Incomplete     Studies/Results: No results found.  Medications: Scheduled Meds:   . cefTRIAXone (ROCEPHIN)  IV  1 g Intravenous Q24H  . Chlorhexidine Gluconate Cloth  6 each Topical Q0600  . dextrose 5% lactated ringers  250 mL Intravenous Once  . diltiazem  60 mg Oral Q6H  . enoxaparin (LOVENOX) injection  30 mg Subcutaneous Daily  . Fluticasone-Salmeterol  1 puff Inhalation BID  .  ipratropium  0.5 mg Nebulization TID  . levalbuterol  0.63 mg Nebulization TID  . metoprolol succinate  50 mg Oral Daily  . mupirocin ointment   Nasal BID  . DISCONTD: diltiazem  30 mg Oral Q6H  . DISCONTD: enoxaparin  40 mg Subcutaneous Daily  . DISCONTD: mupirocin ointment   Nasal BID   Continuous Infusions:   . DISCONTD: diltiazem (CARDIZEM) infusion Stopped (08/11/11 1715)   PRN Meds:.metoprolol, morphine, ondansetron, DISCONTD: ondansetron (ZOFRAN) IV, DISCONTD: ondansetron  Assessment/Plan  1. Refractory Nausea Vomiting, Abdominal Pain: Presumed viral gastroenteritis, but not getting better. Had urinary retention overnight c/o abdominal fullness. Reviewed urine hx and see where on 3/12 she had Kleb and citrobacter UTI >100 colonies. I am more concerned now for UTI and maybe even pyelo, no fevers, no WBC previously. She also has history of SBO in past.  -place foley repeat urine  -Start Rocephin  -Check  KUB  -adjust meds for NPO if she cannot keep them down  -IV fluid bolus with D5LR, may add K pending labs  2. Afib; not taking PO again need to monitor A-fib closely she has prn lopressor orders for HR >120, continue oral pills if RVR develops will start he on dilt gtt again at 5. I reviewed her anticoag history-no anicoag because of past bleeding AVMs and gastritis. Continue rate control.  3. Macrocytosis: check a B12 level, no anemia  4. Dispo: Not ready for dispo, medical management     LOS: 4 days   [facility] Pager: [phone] 08/12/2011, 9:03 AM

## 2011-08-12 NOTE — Progress Notes (Signed)
PT Cancellation Note  Treatment cancelled today due to pt currently being transported out of room to [facility].  Will try another time.     Sunny Schlein, [place] [phone] 08/12/2011, 10:58 AM

## 2011-08-13 LAB — STOOL CULTURE: Special Requests: NORMAL

## 2011-08-13 MED ORDER — DILTIAZEM HCL ER COATED BEADS 120 MG PO CP24
120.0000 mg | ORAL_CAPSULE | Freq: Every day | ORAL | Status: DC
  Administered 2011-08-13 – 2011-08-15 (×3): 120 mg via ORAL
  Filled 2011-08-13 (×3): qty 1

## 2011-08-13 MED ORDER — PANTOPRAZOLE SODIUM 40 MG IV SOLR
40.0000 mg | INTRAVENOUS | Status: DC
  Administered 2011-08-13 – 2011-08-14 (×2): 40 mg via INTRAVENOUS
  Filled 2011-08-13 (×2): qty 40

## 2011-08-13 NOTE — Progress Notes (Signed)
Physical Therapy Evaluation Patient Details Name: Abigail [name] MRN: [id number] DOB: 1919-09-14 Today's Date: 08/13/2011  Problem List:  Patient Active Problem List  Diagnoses  . HYPERLIPIDEMIA-MIXED  . HYPERTENSION, BENIGN  . HYPERTENSION  . CAD, NATIVE VESSEL  . Atrial fibrillation  . CAROTID STENOSIS  . AORTIC ATHEROSCLEROSIS  . ATHEROSCLEROSIS  . PERIPHERAL VASCULAR DISEASE  . COPD  . REFLUX GASTRITIS  . DIVERTICULOSIS, COLON  . RECTAL BLEEDING  . HAIR LOSS  . LOW BACK PAIN SYNDROME  . DIZZINESS  . ANKLE EDEMA  . DYSURIA  . COLONIC POLYPS, HX OF  . TOBACCO ABUSE, HX OF  . SKIN LESION  . Foot pain, left  . Goiter  . Osteoporosis with fracture  . Gastroenteritis    Past Medical History:  Past Medical History  Diagnosis Date  . CAD (coronary artery disease)     s/p PTCA RCA (Dr. Juliann Pares)  . PVD (peripheral vascular disease)   . Atrial fibrillation   . Hypertension   . Am[place]   . COPD (chronic obstructive pulmonary disease) 2008    emphysemaous with asthmatic component-FEV 2008 34%  . Rectal bleeding   . SBO (small bowel obstruction)   . MRSA (methicillin resistant Staphylococcus aureus)   . Nontoxic multinodular goiter   . History of esophagitis     gastritis, duodenitis.  EGD neg 10/20/02  . Tachycardia   . Hypercholesterolemia   . Arteriovenous malformation of duodenum   . tobacco abuse, history of   . atherosclerosis   . Leg cramps     with mild claudication  . Sinus tachycardia   . Hip fracture     right, pins 2009  . Diverticulosis of colon   . Hx of colonic polyps   . Gall stones 10/1997    abd [place]  . Goiter     thyroid US- bx 06/26/03  . Femoral neck fracture     ORIF, Dr. Yisroel Ramming 02/20/08  . Compression fracture of vertebra     T4/ kyphoplasty, hosp 12/31-06/02/10  . Colon cancer     Colonoscopy wnl 10/21/02  . Carcinoma of colon     s/p resection  . Skin cancer     L forearm 2012, per derm   Past Surgical History:  Past Surgical  History  Procedure Date  . Colon resection 05/1997    cancer and carcinoid tumor  . Ileostomy 05/1997    takedown 7/99  . Cardiac catheterization     stent/ ER, ok LE's 10/00. PV airtigraogy- stent 10/00  . Angioplasty 7/00    R CA  . Abdominal surgery     incarcerated ventral hernia, MRSA 06/28/00  . Cataract extraction 2004    bilateral  . Total abdominal hysterectomy     PT Assessment/Plan/Recommendation PT Assessment Clinical Impression Statement: Pt admitted with nausea/vomitting/diarrhea along with the below PT problem list.  Pt would benefit from acute PT to maximize independence and facilitate d/c home with HHPT. PT Recommendation/Assessment: Patient will need skilled PT in the acute care venue PT Problem List: Decreased strength;Decreased activity tolerance;Decreased balance;Decreased mobility Barriers to Discharge: None PT Therapy Diagnosis : Generalized weakness;Difficulty walking PT Plan PT Frequency: Min 3X/week PT Treatment/Interventions: Gait training;Stair training;Functional mobility training;Therapeutic activities;Balance training;Patient/family education PT Recommendation Follow Up Recommendations: Home health PT Equipment Recommended: None recommended by PT PT Goals  Acute Rehab PT Goals PT Goal Formulation: With patient Time For Goal Achievement: 7 days Pt will go Sit to Stand: with modified independence PT Goal: Sit  to Stand - Progress: Goal set today Pt will go Stand to Sit: with modified independence PT Goal: Stand to Sit - Progress: Goal set today Pt will Ambulate: >150 feet;with modified independence;with least restrictive assistive device PT Goal: Ambulate - Progress: Goal set today Pt will Go Up / Down Stairs: 1-2 stairs;with modified independence;with least restrictive assistive device PT Goal: Up/Down Stairs - Progress: Goal set today  PT Evaluation Precautions/Restrictions  Precautions Precautions: Fall Required Braces or Orthoses:  No Restrictions Weight Bearing Restrictions: No Prior Functioning  Home Living Lives With: Son Type of Home: House Home Layout: One level Home Access: Stairs to enter Entrance Stairs-Rails: None Entrance Stairs-Number of Steps: 2 Bathroom Shower/Tub: Tub/shower unit;Door Bathroom Toilet: Standard Bathroom Accessibility: Yes Home Adaptive Equipment: Walker - rolling;Straight cane Prior Function Level of Independence: Independent with basic ADLs;Independent with homemaking with ambulation;Independent with gait;Independent with transfers Able to Take Stairs?: Yes Driving: Yes Vocation: Retired Producer, television/film/video: Awake/alert Overall Cognitive Status: Appears within functional limits for tasks assessed Orientation Level: Oriented X4 Sensation/Coordination Sensation Light Touch: Appears Intact Stereognosis: Not tested Hot/Cold: Not tested Proprioception: Not tested Coordination Gross Motor Movements are Fluid and Coordinated: Yes Fine Motor Movements are Fluid and Coordinated: Yes Extremity Assessment RUE Assessment RUE Assessment: Within Functional Limits LUE Assessment LUE Assessment: Within Functional Limits RLE Assessment RLE Assessment: Within Functional Limits LLE Assessment LLE Assessment: Within Functional Limits Pain 0/10 with treatment. Mobility (including Balance) Bed Mobility Bed Mobility: Yes Supine to Sit: 6: Modified independent (Device/Increase time);HOB flat Transfers Transfers: Yes Sit to Stand: 4: Min assist;With upper extremity assist;From bed;From chair/3-in-1 (2 trials.) Sit to Stand Details (indicate cue type and reason): Assist for balance due to generalized weakness with cues for hand placement. Stand to Sit: 4: Min assist;With upper extremity assist;To chair/3-in-1 (Min (guard); 2 trials.) Stand to Sit Details: Guarding for balance with cues for hand placement. Ambulation/Gait Ambulation/Gait: Yes Ambulation/Gait  Assistance: 4: Min assist Ambulation/Gait Assistance Details (indicate cue type and reason): Assist for balance with cues for safety. Ambulation Distance (Feet): 25 Feet (5 feet and then 20 feet.) Assistive device: 1 person hand held assist Gait Pattern: Decreased step length - right;Decreased step length - left;Shuffle Stairs: No Corporate treasurer: No  Posture/Postural Control Posture/Postural Control: No significant limitations Balance Balance Assessed: No End of Session PT - End of Session Equipment Utilized During Treatment: Gait belt Activity Tolerance: Patient tolerated treatment well Patient left: in chair;with call bell in reach Nurse Communication: Mobility status for transfers;Mobility status for ambulation General Behavior During Session: [facility] for tasks performed Cognition: [facility] for tasks performed  Cephus Shelling 08/13/2011, 1:28 PM  08/13/2011 Cephus Shelling, PT, DPT [phone]

## 2011-08-13 NOTE — Progress Notes (Signed)
Subjective: Much better  Objective: Vital signs in last 24 hours: Temp:  [98 F (36.7 C)-98.8 F (37.1 C)] 98 F (36.7 C) (03/17 0538) Pulse Rate:  [73-90] 79  (03/17 0538) Resp:  [17-19] 17  (03/17 0538) BP: (92-104)/(51-60) 101/52 mmHg (03/17 0538) SpO2:  [92 %-97 %] 92 % (03/17 1407) Weight:  [56.9 kg (125 lb 7.1 oz)] 56.9 kg (125 lb 7.1 oz) (03/17 0538) Weight change: 3.3 kg (7 lb 4.4 oz) Last BM Date: 08/12/11  Intake/Output from previous day: 03/16 0701 - 03/17 0700 In: 678.4 [P.O.:540; I.V.:88.4; IV Piggyback:50] Out: 1375 [Urine:985; Emesis/NG output:390]    Physical Exam: General: Alert, awake, oriented x3,looks weaker HEENT: No bruits, no goiter. Heart: IR rhythm, without murmurs, rubs, gallops. Lungs: Clear to auscultation bilaterally. Abdomen: hypoactive bowel sounds, tender to palpation , more distended Extremities: No clubbing cyanosis or edema with positive pedal pulses. Neuro: Grossly intact, nonfocal.   Lab Results: Basic Metabolic Panel:  Basename 08/12/11 0915 08/11/11 0500  NA 143 138  K 5.3* 4.7  CL 104 106  CO2 31 25  GLUCOSE 155* 115*  BUN 32* 23  CREATININE 1.00 1.15*  CALCIUM 9.7 9.3  MG 1.9 1.8  PHOS -- --   CBC:  Basename 08/12/11 0915 08/11/11 0500  WBC 6.6 6.8  NEUTROABS -- --  HGB 15.4* 14.7  HCT 44.7 42.8  MCV 98.9 99.5  PLT 146* 124*    Recent Results (from the past 240 hour(s))  URINE CULTURE     Status: Normal   Collection Time   08/08/11  9:20 PM      Component Value Range Status Comment   Specimen Description URINE, CATHETERIZED   Final    Special Requests ADDED ON [id number] @2333    Final    Culture  Setup Time [id number]   Final    Colony Count >=100,000 COLONIES/ML   Final    Culture     Final    Value: CITROBACTER YOUNGAE     KLEBSIELLA PNEUMONIAE   Report Status 08/11/2011 FINAL   Final    Organism ID, Bacteria CITROBACTER YOUNGAE   Final    Organism ID, Bacteria KLEBSIELLA PNEUMONIAE   Final   MRSA PCR  SCREENING     Status: Abnormal   Collection Time   08/09/11  8:50 AM      Component Value Range Status Comment   MRSA by PCR POSITIVE (*) NEGATIVE  Final   CLOSTRIDIUM DIFFICILE BY PCR     Status: Normal   Collection Time   08/09/11  4:32 PM      Component Value Range Status Comment   C difficile by pcr NEGATIVE  NEGATIVE  Final   STOOL CULTURE     Status: Normal   Collection Time   08/09/11  4:32 PM      Component Value Range Status Comment   Specimen Description STOOL   Final    Special Requests Normal   Final    Culture     Final    Value: NO SALMONELLA, SHIGELLA, CAMPYLOBACTER, OR YERSINIA ISOLATED   Report Status 08/13/2011 FINAL   Final   URINE CULTURE     Status: Normal (Preliminary result)   Collection Time   08/12/11 10:34 AM      Component Value Range Status Comment   Specimen Description URINE, CATHETERIZED   Final    Special Requests Normal   Final    Culture  Setup Time [id number]   Final  Colony Count 2,000 COLONIES/ML   Final    Culture GRAM NEGATIVE RODS   Final    Report Status PENDING   Incomplete     Studies/Results: Dg Abd Acute W/chest  08/12/2011  *RADIOLOGY REPORT*  Clinical Data: Rule out small bowel obstruction  ACUTE ABDOMEN SERIES (ABDOMEN 2 VIEW & CHEST 1 VIEW)  Comparison: CT 04/20/2008, plain films 05/19/1999  Findings: Stable cardiac silhouette.  There is elevation left hemidiaphragm with associate atelectasis.  There is upper lobe of the pulmonary scarring extending to the apices.   There is single loop of dilated small bowel on the lateral projection.  Air fluid levels within the colon.  No intraperitoneal free air. Gas in the rectum.  Vascular stents noted.  IMPRESSION:  1.   No evidence of high-grade obstruction.  2.  Single loop of dilated small bowel and fluid within the colon could represent ileus.  Cannot exclude early obstruction.  3.  Pulmonary scarring in the upper lobes.  Original Report Authenticated By: Genevive Bi, M.D.     Medications: Scheduled Meds:    . cefTRIAXone (ROCEPHIN)  IV  1 g Intravenous Q24H  . Chlorhexidine Gluconate Cloth  6 each Topical Q0600  . diltiazem  120 mg Oral Daily  . enoxaparin (LOVENOX) injection  30 mg Subcutaneous Daily  . Fluticasone-Salmeterol  1 puff Inhalation BID  . ipratropium  0.5 mg Nebulization TID  . levalbuterol  0.63 mg Nebulization TID  . metoprolol succinate  50 mg Oral Daily  . mupirocin ointment   Nasal BID  . pantoprazole (PROTONIX) IV  40 mg Intravenous Q24H   Continuous Infusions:    . diltiazem (CARDIZEM) infusion 5 mg/hr (08/13/11 0604)   PRN Meds:.metoCLOPramide (REGLAN) injection, metoprolol, morphine, ondansetron  Assessment/Plan  1. Refractory Nausea Vomiting, Abdominal Pain: MUCH better, patient tells me she is dramatically better, not sure if abx playing a role? But she is OOB tolerating PO now. Also started on PPI. May need outpatient GI eval. She also thinks Foley has helps?urinary retension.  2. Afib; will cut cardizem gtt in half and start long acting diltiazem  3. Macrocytosis: B12 normal  4. Dispo: Will get PT eval, may d/c tomorrow if continues to improve.     LOS: 5 days   [facility] Pager: [phone] 08/13/2011, 6:25 PM

## 2011-08-14 LAB — CBC
HCT: 43.4 % (ref 36.0–46.0)
Hemoglobin: 14.5 g/dL (ref 12.0–15.0)
MCH: 33.6 pg (ref 26.0–34.0)
MCHC: 33.4 g/dL (ref 30.0–36.0)
MCV: 100.7 fL — ABNORMAL HIGH (ref 78.0–100.0)

## 2011-08-14 LAB — BASIC METABOLIC PANEL
BUN: 22 mg/dL (ref 6–23)
Chloride: 103 mEq/L (ref 96–112)
Glucose, Bld: 102 mg/dL — ABNORMAL HIGH (ref 70–99)
Potassium: 3.9 mEq/L (ref 3.5–5.1)

## 2011-08-14 LAB — URINE CULTURE: Culture  Setup Time: 201303161706

## 2011-08-14 MED ORDER — PANTOPRAZOLE SODIUM 40 MG PO TBEC: 40.0000 mg | DELAYED_RELEASE_TABLET | Freq: Every day | ORAL | Status: DC

## 2011-08-14 MED ORDER — ENOXAPARIN SODIUM 40 MG/0.4ML ~~LOC~~ SOLN
40.0000 mg | SUBCUTANEOUS | Status: DC
  Administered 2011-08-15: 40 mg via SUBCUTANEOUS
  Filled 2011-08-14: qty 0.4

## 2011-08-14 NOTE — Progress Notes (Signed)
Subjective: Im ready to go home and feeling better-"getting fussy so that means Im better"  Objective: Vital signs in last 24 hours: Temp:  [98.3 F (36.8 C)-98.7 F (37.1 C)] 98.6 F (37 C) (03/18 0646) Pulse Rate:  [78-89] 84  (03/18 1214) Resp:  [18-19] 19  (03/18 0646) BP: (96-111)/(46-59) 111/49 mmHg (03/18 1214) SpO2:  [92 %-96 %] 95 % (03/18 0743) Weight:  [55.9 kg (123 lb 3.8 oz)] 55.9 kg (123 lb 3.8 oz) (03/18 0646) Weight change: -1 kg (-2 lb 3.3 oz) Last BM Date: 08/13/11  Intake/Output from previous day: 03/17 0701 - 03/18 0700 In: 150 [P.O.:60; I.V.:90] Out: 1175 [Urine:1175] Total I/O In: 120 [P.O.:120] Out: -   Physical Exam: General: Alert, awake, oriented x3,looks weaker HEENT: No bruits, no goiter. Heart: IR rhythm, without murmurs, rubs, gallops. Lungs: Clear to auscultation bilaterally. Abdomen: hypoactive bowel sounds, tender to palpation , more distended Extremities: No clubbing cyanosis or edema with positive pedal pulses. Neuro: Grossly intact, nonfocal.   Lab Results: Basic Metabolic Panel:  Basename 08/12/11 0915  NA 143  K 5.3*  CL 104  CO2 31  GLUCOSE 155*  BUN 32*  CREATININE 1.00  CALCIUM 9.7  MG 1.9  PHOS --   CBC:  Basename 08/12/11 0915  WBC 6.6  NEUTROABS --  HGB 15.4*  HCT 44.7  MCV 98.9  PLT 146*    Recent Results (from the past 240 hour(s))  URINE CULTURE     Status: Normal   Collection Time   08/08/11  9:20 PM      Component Value Range Status Comment   Specimen Description URINE, CATHETERIZED   Final    Special Requests ADDED ON [id number] @2333    Final    Culture  Setup Time [id number]   Final    Colony Count >=100,000 COLONIES/ML   Final    Culture     Final    Value: CITROBACTER YOUNGAE     KLEBSIELLA PNEUMONIAE   Report Status 08/11/2011 FINAL   Final    Organism ID, Bacteria CITROBACTER YOUNGAE   Final    Organism ID, Bacteria KLEBSIELLA PNEUMONIAE   Final   MRSA PCR SCREENING     Status: Abnormal    Collection Time   08/09/11  8:50 AM      Component Value Range Status Comment   MRSA by PCR POSITIVE (*) NEGATIVE  Final   CLOSTRIDIUM DIFFICILE BY PCR     Status: Normal   Collection Time   08/09/11  4:32 PM      Component Value Range Status Comment   C difficile by pcr NEGATIVE  NEGATIVE  Final   STOOL CULTURE     Status: Normal   Collection Time   08/09/11  4:32 PM      Component Value Range Status Comment   Specimen Description STOOL   Final    Special Requests Normal   Final    Culture     Final    Value: NO SALMONELLA, SHIGELLA, CAMPYLOBACTER, OR YERSINIA ISOLATED   Report Status 08/13/2011 FINAL   Final   URINE CULTURE     Status: Normal   Collection Time   08/12/11 10:34 AM      Component Value Range Status Comment   Specimen Description URINE, CATHETERIZED   Final    Special Requests Normal   Final    Culture  Setup Time [id number]   Final    Colony Count 2,000 COLONIES/ML   Final  Culture KLEBSIELLA PNEUMONIAE   Final    Report Status 08/14/2011 FINAL   Final    Organism ID, Bacteria KLEBSIELLA PNEUMONIAE   Final     Studies/Results: No results found.  Medications: Scheduled Meds:    . cefTRIAXone (ROCEPHIN)  IV  1 g Intravenous Q24H  . Chlorhexidine Gluconate Cloth  6 each Topical Q0600  . diltiazem  120 mg Oral Daily  . enoxaparin (LOVENOX) injection  40 mg Subcutaneous Q24H  . Fluticasone-Salmeterol  1 puff Inhalation BID  . ipratropium  0.5 mg Nebulization TID  . levalbuterol  0.63 mg Nebulization TID  . metoprolol succinate  50 mg Oral Daily  . mupirocin ointment   Nasal BID  . pantoprazole  40 mg Oral Q1200  . DISCONTD: enoxaparin (LOVENOX) injection  30 mg Subcutaneous Daily  . DISCONTD: pantoprazole (PROTONIX) IV  40 mg Intravenous Q24H   Continuous Infusions:    . DISCONTD: diltiazem (CARDIZEM) infusion Stopped (08/14/11 0331)   PRN Meds:.metoCLOPramide (REGLAN) injection, metoprolol, morphine, ondansetron  Assessment/Plan  1. Refractory  Nausea Vomiting, Abdominal Pain: Same as yesterday-MUCH better, patient tells me she is dramatically better, not sure if abx playing a role? But she is OOB tolerating PO now.Also started on PPI. May need outpatient GI eval. She also thinks Foley has helps?urinary retension.  2. Afib: Rate controlled now. D/C Cardizem gtt IV. Started on LA Diltiazem at 120-may need to increase, will check BP.  3. Macrocytosis: B12 normal  4. Dispo: D/C in AM after I speak with her son, will need HH PT. Need to make sure her HR is stable before she goes home.      LOS: 6 days   [facility] Pager: [phone] 08/14/2011, 12:37 PM

## 2011-08-14 NOTE — Progress Notes (Signed)
   CARE MANAGEMENT NOTE 08/14/2011  Patient:  Abigail [name], Abigail [name]   Account Number:  [id number]  Date Initiated:  08/14/2011  Documentation initiated by:  Darlyne Russian  Subjective/Objective Assessment:   Patient admitted with abdominal pain, nausea and vomiting     Action/Plan:   Patient lives with son.Progression of care and discharge planning.   Anticipated DC Date:  08/15/2011   Anticipated DC Plan:  HOME W HOME HEALTH SERVICES      DC Planning Services  CM consult      [facility] Choice  HOME HEALTH   Choice offered to / List presented to:  C-1 Patient        HH arranged  HH-2 PT      [facility] agency  [facility]   Status of service:  In process, will continue to follow Medicare Important Message given?   (If response is "NO", the following Medicare IM given date fields will be blank) Date Medicare IM given:   Date Additional Medicare IM given:    Discharge Disposition:    Per UR Regulation:    If discussed at Long Length of Stay Meetings, dates discussed:    Comments:  PCP: Crawford Givens MD  08/14/2011 3:45 pm Darlyne Russian RN, CCM  [phone]  Met with patient to discuss discharge planning for home PT. She has used [facility] and requests referral to [facility]. She has DME at home: commode chair, walker, cane and Oxygen.   CM to continue to follow for discharge needs. Call to [facility]/ [facility] referral for home PT.

## 2011-08-15 MED ORDER — METOPROLOL SUCCINATE ER 50 MG PO TB24: 50.0000 mg | ORAL_TABLET | Freq: Every day | ORAL | Status: DC

## 2011-08-15 MED ORDER — DILTIAZEM HCL ER COATED BEADS 120 MG PO CP24: 120.0000 mg | ORAL_CAPSULE | Freq: Every day | ORAL | Status: DC

## 2011-08-15 MED ORDER — CIPROFLOXACIN HCL 250 MG PO TABS: 250.0000 mg | ORAL_TABLET | Freq: Two times a day (BID) | ORAL | Status: AC

## 2011-08-15 NOTE — Progress Notes (Signed)
Physical Therapy Treatment Patient Details Name: Abigail [name] MRN: [id number] DOB: Apr 22, 1920 Today's Date: 08/15/2011  PT Assessment/Plan  PT - Assessment/Plan Comments on Treatment Session: SaO2decreased to 86% on RA with exertion.  Pt related that she had home O2, but used it only when she needed it.  Related to the patient that she did need it right now. PT Plan: Discharge plan remains appropriate Follow Up Recommendations: Home health PT Equipment Recommended: None recommended by PT PT Goals  Acute Rehab PT Goals PT Goal: Sit to Stand - Progress: Progressing toward goal PT Goal: Stand to Sit - Progress: Progressing toward goal PT Goal: Ambulate - Progress: Progressing toward goal  PT Treatment Precautions/Restrictions  Precautions Precautions: Fall Required Braces or Orthoses: No Restrictions Weight Bearing Restrictions: No Mobility (including Balance) Bed Mobility Bed Mobility: No Transfers Sit to Stand: 5: Supervision;With armrests;From chair/3-in-1 Sit to Stand Details (indicate cue type and reason): for safety Stand to Sit: 5: Supervision;With armrests;To chair/3-in-1 Ambulation/Gait Ambulation/Gait: Yes Ambulation/Gait Assistance: Other (comment) (min guard A) Ambulation/Gait Assistance Details (indicate cue type and reason): mildly guard gait pushing the IV pole; more guarded with halting gait when asked to walk without A device. Ambulation Distance (Feet): 105 Feet Assistive device: None (pushing IV pole) Gait Pattern: Step-through pattern;Decreased step length - right;Decreased step length - left;Decreased stride length (stiff and guarded) Stairs: No  Posture/Postural Control Posture/Postural Control: No significant limitations Balance Balance Assessed:  (generally steady gait, but guarded and stiff) Exercise    End of Session PT - End of Session Activity Tolerance: Patient tolerated treatment well;Patient limited by fatigue Patient left: in chair;with  call bell in reach Nurse Communication: Mobility status for transfers;Mobility status for ambulation;Other (comment) (related O2 sats on RA with exertion at 86%) General Behavior During Session: [facility] for tasks performed Cognition: [facility] for tasks performed  Antoney Biven, Eliseo Gum 08/15/2011, 12:54 PM  08/15/2011  [place], PT [phone] (pager)

## 2011-08-22 ENCOUNTER — Telehealth: Payer: Self-pay

## 2011-08-22 NOTE — Telephone Encounter (Signed)
Tresa Endo, a therapist with [facility] request verbal order for nurse to come out and evaluate pt due to significant break down on buttocks. Tresa Endo can be reached at [phone].

## 2011-08-22 NOTE — Telephone Encounter (Signed)
Abigail [name] advised.  She says Abigail [name] has an appt here on Wednesday, March 27th and that we will probably find that there are numerous issues to address.  She just wants to be sure that this issue with her buttocks isn't overlooked.  She also is concerned that they have not started the new BP meds that were prescribed in the hospital although her BP is fine today.

## 2011-08-22 NOTE — Telephone Encounter (Signed)
Please give verbal order for nurse to come out and evaluate pt due to significant break down on buttocks.

## 2011-08-23 ENCOUNTER — Encounter: Payer: Self-pay | Admitting: Family Medicine

## 2011-08-23 ENCOUNTER — Ambulatory Visit (INDEPENDENT_AMBULATORY_CARE_PROVIDER_SITE_OTHER): Payer: Medicare Other | Admitting: Family Medicine

## 2011-08-23 VITALS — BP 120/66 | HR 84 | Temp 98.3°F | Wt 126.2 lb

## 2011-08-23 DIAGNOSIS — K5289 Other specified noninfective gastroenteritis and colitis: Secondary | ICD-10-CM

## 2011-08-23 DIAGNOSIS — I4891 Unspecified atrial fibrillation: Secondary | ICD-10-CM

## 2011-08-23 DIAGNOSIS — K529 Noninfective gastroenteritis and colitis, unspecified: Secondary | ICD-10-CM

## 2011-08-23 MED ORDER — DOXYCYCLINE HYCLATE 100 MG PO TABS: 100.0000 mg | ORAL_TABLET | Freq: Two times a day (BID) | ORAL | Status: AC

## 2011-08-23 NOTE — Patient Instructions (Signed)
Finish the cipro (the antibiotic from the hospital). Start doxycycline for the skin infection. Take atenolol for your heart. Take care.

## 2011-08-23 NOTE — Telephone Encounter (Signed)
Noted.  Will see later today/

## 2011-08-24 ENCOUNTER — Telehealth: Payer: Self-pay | Admitting: Internal Medicine

## 2011-08-24 ENCOUNTER — Encounter: Payer: Self-pay | Admitting: Family Medicine

## 2011-08-24 NOTE — Assessment & Plan Note (Signed)
Resolved.  I don't think the skin changes are due to bed sores.  I would cover for presumed MRSA and set up home health for home eval.  Start doxy on top of other abx and finish the cipro.  D/w pt and son.  They understood.

## 2011-08-24 NOTE — Progress Notes (Signed)
Admitted with AGE, electrolyte imbalances.  S/p IV fluids, lytes corrected, and finishing cipro- has a few doses left.  No vomiting, diarrhea now.  No fevers.  Appetite returning to baseline.  She has some RVR as inpatient, but wasn't started on any new meds- rx wasn't sent to pharmacy.  She still has old rx for atenolol.  Feeling better except for skin breakdown on buttocks.  MRSA screen positive at [place].    Meds, vitals, and allergies reviewed.   ROS: See HPI.  Otherwise, noncontributory.  nad ncat Mmm rrr ctab abd soft Buttock w/o ulceration but blanching red patches that are not confluent noted bilaterally.  Most have pustules or remnants of pustules.  No fluctuance but the areas are tender.

## 2011-08-24 NOTE — Assessment & Plan Note (Signed)
She sounds regular today.  I would restart atenolol and f/u prn.  She is considering moving to [place] and may establish there.  >25 min spent with face to face with patient, >50% counseling and/or coordinating care.

## 2011-08-24 NOTE — Telephone Encounter (Signed)
Pt's med was changed at the hospital ,  atenilol was d/c, started on metoprolol 50 mg 1 a day, diltiazem 120mg  1 a day, pt not happy about the change and wants to see if dr Tenny Craw ok with it, pls call, has appt 09-04-11

## 2011-08-24 NOTE — Telephone Encounter (Signed)
Called the patient's home and spoke with Amy the nurse from [facility]. Discussed the patient's medications post hospital. She wanted to go back on Atenolol and stop Metoprolol and Diltiazem. Her BP was 104/50 with HR of 64 today. She took her meds last night and this am even though they were order for every 24 hours. Patient saw Dr.Duncan yesterday and he stopped her Metoprolol and Diltiazem and had her return to taking Atenolol but she did not understand the instructions. Amy will review the correct medication regimen with her and follow BP and let us know if not WNL.

## 2011-08-31 NOTE — Discharge Summary (Signed)
Hospital Discharge Note  Name: Abigail [name] MRN: [id number] DOB: [birth year] 78 y.o.  Date of Admission: 08/08/2011  8:19 PM Date of Discharge: 08/15/2011 Attending Physician: Phillips Odor  Discharge Diagnosis: Principal Problem:  *Gastroenteritis Active Problems:  HYPERTENSION, BENIGN  CAD, NATIVE VESSEL  Atrial fibrillation  REFLUX GASTRITIS   Discharge Medications: Medication List  As of 08/31/2011  9:51 AM   STOP taking these medications         atenolol 25 MG tablet         TAKE these medications         aspirin 81 MG tablet   Take 81 mg by mouth daily.      Biotin 1000 MCG tablet   Take 1,000 mcg by mouth daily.      Calcium 1500 MG tablet   Take 1,500 mg by mouth daily.      CENTRUM SILVER PO   Take by mouth daily.      cholecalciferol 1000 UNITS tablet   Commonly known as: VITAMIN D   Take 1,000 Units by mouth daily.      Fish Oil 1000 MG Caps   Take by mouth daily.      Fluticasone-Salmeterol 250-50 MCG/DOSE Aepb   Commonly known as: ADVAIR   Inhale 1 puff into the lungs 2 (two) times daily.      GRAPE SEED PO   Take 2 by mouth daily      HEMOCYTE 325 (106 FE) MG Tabs   Generic drug: ferrous fumarate   Take by mouth daily.      nitroGLYCERIN 0.4 MG SL tablet   Commonly known as: NITROSTAT   Place one under tongue every 5 minutes x 3 as needed      NON FORMULARY   Use 2.5 liters daily as needed      PRILOSEC 20 MG capsule   Generic drug: omeprazole   Take 20 mg by mouth daily.      TYLENOL 500 MG tablet   Generic drug: acetaminophen   Take one as needed as instructed on the bottle      VENTOLIN HFA 108 (90 BASE) MCG/ACT inhaler   Generic drug: albuterol   One puff by mouth every 2-4 hours as needed      Vitamin B-12 500 MCG Lozg   Take by mouth daily.            Disposition and follow-up:   Ms.Abigail [name] was discharged from [facility] in Stable condition.    Follow-up Appointments: Follow-up Information    Follow up with Crawford Givens, MD. Schedule an appointment as soon as possible for a visit in 1 week. (need repeat urine test and labs)         Discharge Orders    Future Appointments: Provider: Department: Dept Phone: Center:   09/04/2011 10:45 AM Pricilla Riffle, MD [facility] [phone] [id number]     Future Orders Please Complete By Expires   Diet - low sodium heart healthy      Increase activity slowly      Call MD for:  temperature >100.4      Call MD for:  persistant nausea and vomiting      Call MD for:  severe uncontrolled pain      Call MD for:  difficulty breathing, headache or visual disturbances      Call MD for:  hives      Call MD for:  persistant dizziness or light-headedness  Call MD for:  extreme fatigue      (HEART FAILURE PATIENTS) Call MD:  Anytime you have any of the following symptoms: 1) 3 pound weight gain in 24 hours or 5 pounds in 1 week 2) shortness of breath, with or without a dry hacking cough 3) swelling in the hands, feet or stomach 4) if you have to sleep on extra pillows at night in order to breathe.         Consultations:    Procedures Performed:  Dg Abd Acute W/chest  08/12/2011  *RADIOLOGY REPORT*  Clinical Data: Rule out small bowel obstruction  ACUTE ABDOMEN SERIES (ABDOMEN 2 VIEW & CHEST 1 VIEW)  Comparison: CT 04/20/2008, plain films 05/19/1999  Findings: Stable cardiac silhouette.  There is elevation left hemidiaphragm with associate atelectasis.  There is upper lobe of the pulmonary scarring extending to the apices.   There is single loop of dilated small bowel on the lateral projection.  Air fluid levels within the colon.  No intraperitoneal free air. Gas in the rectum.  Vascular stents noted.  IMPRESSION:  1.   No evidence of high-grade obstruction.  2.  Single loop of dilated small bowel and fluid within the colon could represent ileus.  Cannot exclude early obstruction.  3.  Pulmonary scarring in the upper lobes.  Original Report  Authenticated By: Abigail [name], M.D.   Admission HPI: 78 yo with multiple medical problems who was at a reception party on Sunday with her son and shortly after they both developed NVD. She was admitted with dehydration and A-Fib with RVR.  Hospital Course by problem list: Principal Problem:  *Gastroenteritis Active Problems:  HYPERTENSION, BENIGN  CAD, NATIVE VESSEL  Atrial fibrillation  REFLUX GASTRITIS  1. Refractory Nausea Vomiting, Abdominal Pain:  Presumed viral gastroenteritis, but prolonged course. Had urinary retention had Kleb and citrobacter UTI >100 colonies on urine micro.Started Rocephin and improved. KUB  Negative. Conservative measures, IV fluids, NPO advanced as tolerated, eating at d/c but deconditioned.  2. Afib; complicated by her not taking PO was alternated between IV rate control meds and pills as tolerated, now rate controlled on oral regimen. Not a candidate for anticoagulation because of bleeding AVMs.  Discharge Vitals:  BP 100/40  Pulse 73  Temp(Src) 97.7 F (36.5 C) (Oral)  Resp 18  Ht 5\' 4"  (1.626 m)  Wt 57.4 kg (126 lb 8.7 oz)  BMI 21.72 kg/m2  SpO2 86%  Discharge Labs: No results found for this or any previous visit (from the past 24 hour(s)).  SignedAnderson [name] 08/31/2011, 9:51 AM

## 2011-09-04 ENCOUNTER — Encounter: Payer: Self-pay | Admitting: Internal Medicine

## 2011-09-04 ENCOUNTER — Encounter: Payer: Self-pay | Admitting: Family Medicine

## 2011-09-04 ENCOUNTER — Ambulatory Visit (INDEPENDENT_AMBULATORY_CARE_PROVIDER_SITE_OTHER): Payer: Medicare Other | Admitting: Internal Medicine

## 2011-09-04 ENCOUNTER — Telehealth: Payer: Self-pay

## 2011-09-04 ENCOUNTER — Ambulatory Visit (INDEPENDENT_AMBULATORY_CARE_PROVIDER_SITE_OTHER): Payer: Medicare Other | Admitting: Family Medicine

## 2011-09-04 VITALS — BP 120/78 | HR 78 | Ht 64.0 in | Wt 125.0 lb

## 2011-09-04 VITALS — BP 152/70 | HR 68 | Temp 98.3°F | Wt 126.0 lb

## 2011-09-04 DIAGNOSIS — Z23 Encounter for immunization: Secondary | ICD-10-CM

## 2011-09-04 DIAGNOSIS — S81819A Laceration without foreign body, unspecified lower leg, initial encounter: Secondary | ICD-10-CM

## 2011-09-04 DIAGNOSIS — R609 Edema, unspecified: Secondary | ICD-10-CM

## 2011-09-04 DIAGNOSIS — R0602 Shortness of breath: Secondary | ICD-10-CM

## 2011-09-04 DIAGNOSIS — I2510 Atherosclerotic heart disease of native coronary artery without angina pectoris: Secondary | ICD-10-CM

## 2011-09-04 DIAGNOSIS — S81009A Unspecified open wound, unspecified knee, initial encounter: Secondary | ICD-10-CM

## 2011-09-04 LAB — CBC WITH DIFFERENTIAL/PLATELET
Basophils Absolute: 0 10*3/uL (ref 0.0–0.1)
HCT: 37.4 % (ref 36.0–46.0)
Lymphocytes Relative: 18.9 % (ref 12.0–46.0)
Lymphs Abs: 1.1 10*3/uL (ref 0.7–4.0)
Monocytes Relative: 9.4 % (ref 3.0–12.0)
Platelets: 145 10*3/uL — ABNORMAL LOW (ref 150.0–400.0)
RDW: 13.9 % (ref 11.5–14.6)

## 2011-09-04 LAB — BASIC METABOLIC PANEL
CO2: 31 mEq/L (ref 19–32)
Calcium: 9.3 mg/dL (ref 8.4–10.5)
GFR: 73.83 mL/min (ref >60.00)
Sodium: 141 mEq/L (ref 135–145)

## 2011-09-04 NOTE — Patient Instructions (Signed)
Your physician recommends that you schedule a follow-up appointment in: in January 2014. The office will mail you a reminder letter 2 months prior appointment date. Your physician recommends that you return for lab work in: today CBC w/diff, BNP, BMET.

## 2011-09-04 NOTE — Telephone Encounter (Signed)
Pt cut left lower leg on side of lawnmower. Pt has flap cut on lower lt leg. Dr Para March said he would see pt. Pt had last Td 08/15/2004. Treatment room already occupied. Pt is extra room on Dr Lucretia Roers side. Lugene advised.

## 2011-09-04 NOTE — Progress Notes (Signed)
HPI Abigail [name] is an 78 year old with a hsitory of CAD (cath 2002:  nonobstructive CAD; Cardiolite 2005 Normal), CV disease, PVOD, dyslipidemia, COPD and PAF.  She was last in cardiology clinic in Feb 2012. She was recently admitted with gasterenteritis. Still weak.  No CP.  Breathing is OK. Since d/c has had some LE edemia.  Allergies  Allergen Reactions  . Diltiazem     Low pulse  . Metoprolol     Low pulse  . Sulfonamide Derivatives     REACTION: unspecified  . Symbicort     REACTION: STATES FEELS AS IF HAVING HEART ATTACK    Current Outpatient Prescriptions  Medication Sig Dispense Refill  . acetaminophen (TYLENOL) 500 MG tablet Take one as needed as instructed on the bottle       . albuterol (VENTOLIN HFA) 108 (90 BASE) MCG/ACT inhaler One puff by mouth every 2-4 hours as needed      . aspirin 81 MG tablet Take 81 mg by mouth daily.        Marland Kitchen atenolol (TENORMIN) 25 MG tablet Take 25 mg by mouth daily.      . Biotin 1000 MCG tablet Take 1,000 mcg by mouth daily.        . Calcium 1500 MG tablet Take 1,500 mg by mouth daily.        . cholecalciferol (VITAMIN D) 1000 UNITS tablet Take 1,000 Units by mouth daily.        . Cyanocobalamin (VITAMIN B-12) 500 MCG LOZG Take by mouth daily.        . ferrous fumarate (HEMOCYTE) 325 (106 FE) MG TABS Take by mouth daily.        . Fluticasone-Salmeterol (ADVAIR DISKUS) 250-50 MCG/DOSE AEPB Inhale 1 puff into the lungs 2 (two) times daily.  60 each  11  . GRAPE SEED PO Take 2 by mouth daily       . Multiple Vitamins-Minerals (CENTRUM SILVER PO) Take by mouth daily.        . nitroGLYCERIN (NITROSTAT) 0.4 MG SL tablet Place one under tongue every 5 minutes x 3 as needed  25 tablet  1  . NON FORMULARY Use 2.5 liters daily as needed      . Omega-3 Fatty Acids (FISH OIL) 1000 MG CAPS Take by mouth daily.        Marland Kitchen omeprazole (PRILOSEC) 20 MG capsule Take 20 mg by mouth daily.          Past Medical History  Diagnosis Date  . CAD (coronary artery  disease)     s/p PTCA RCA (Dr. Juliann Pares)  . PVD (peripheral vascular disease)   . Atrial fibrillation   . Hypertension   . Amenia   . COPD (chronic obstructive pulmonary disease) 2008    emphysemaous with asthmatic component-FEV 2008 34%  . Rectal bleeding   . SBO (small bowel obstruction)   . MRSA (methicillin resistant Staphylococcus aureus)   . Nontoxic multinodular goiter   . History of esophagitis     gastritis, duodenitis.  EGD neg 10/20/02  . Tachycardia   . Hypercholesterolemia   . Arteriovenous malformation of duodenum   . tobacco abuse, history of   . atherosclerosis   . Leg cramps     with mild claudication  . Sinus tachycardia   . Hip fracture     right, pins 2009  . Diverticulosis of colon   . Hx of colonic polyps   . Gall stones 10/1997  abd [place]  . Goiter     thyroid US- bx 06/26/03  . Femoral neck fracture     ORIF, Dr. Yisroel Ramming 02/20/08  . Compression fracture of vertebra     T4/ kyphoplasty, hosp 12/31-06/02/10  . Colon cancer     Colonoscopy wnl 10/21/02  . Carcinoma of colon     s/p resection  . Skin cancer     L forearm 2012, per derm    Past Surgical History  Procedure Date  . Colon resection 05/1997    cancer and carcinoid tumor  . Ileostomy 05/1997    takedown 7/99  . Cardiac catheterization     stent/ ER, ok LE's 10/00. PV airtigraogy- stent 10/00  . Angioplasty 7/00    R CA  . Abdominal surgery     incarcerated ventral hernia, MRSA 06/28/00  . Cataract extraction 2004    bilateral  . Total abdominal hysterectomy     Family History  Problem Relation Age of Onset  . Kidney disease Mother     died of kidney failure  . Cancer Father     stomach    History   Social History  . Marital Status: Widowed    Spouse Name: N/A    Number of Children: N/A  . Years of Education: N/A   Occupational History  . Not on file.   Social History Main Topics  . Smoking status: Former Games developer  . Smokeless tobacco: Never Used   Comment: quit 10-11  years ago  . Alcohol Use: No  . Drug Use: No  . Sexually Active: Not on file   Other Topics Concern  . Not on file   Social History Narrative   Daughter with colon cancer, living at home with patient as of 2/12.    Review of Systems:  All systems reviewed.  They are negative to the above problem except as previously stated.  Vital Signs: BP 120/78  Pulse 78  Ht 5\' 4"  (1.626 m)  Wt 125 lb (56.7 kg)  BMI 21.46 kg/m2  Physical Exam Patient in NAD HEENT:  Normocephalic, atraumatic. EOMI, PERRLA.  Neck: JVP is normal. No thyromegaly. No bruits.  Lungs: clear to auscultation. No rales no wheezes.  Heart: Regular rate and rhythm. Normal S1, S2. No S3.   No significant murmurs. PMI not displaced.  Abdomen:  Supple, nontender. Normal bowel sounds. No masses. No hepatomegaly.  Extremities:   Good distal pulses throughout. 1+ lower extremity edema.  Musculoskeletal :moving all extremities.  Neuro:   alert and oriented x3.  CN II-XII grossly intact.   Assessment and Plan:  1. CAD (coronary artery disease)   No symptoms to suggest angina Continue to follow.     2. Edema :  Volume status overalll looks good.  I would check BMET and BNP.  Note TSH was normal in hospital.  Encouraged her to use support socks as well as elevate legs.   3. PVOD:  Rel asymptomatic  4.  Dyslipidemia:  Has not tolerated statins in past.  Watch diet   F/U in January.  Sooner if problems.

## 2011-09-04 NOTE — Patient Instructions (Signed)
Gently wash the area daily and cover with a nonstick bandage.   Recheck later this week, sooner if needed.   Take care.  Notify us if it drains pus or has a spreading red color.

## 2011-09-05 ENCOUNTER — Telehealth: Payer: Self-pay | Admitting: *Deleted

## 2011-09-05 NOTE — Telephone Encounter (Signed)
Message copied by Burnell Blanks on Tue Sep 05, 2011 10:40 AM ------      Message from: Dietrich Pates V      Created: Mon Sep 04, 2011 10:39 PM       CBC is normal.  BMET is stable.  BNP is normal.      I would try support socks for edema.  Reluctant to add diuretic.

## 2011-09-05 NOTE — Telephone Encounter (Signed)
Advised son of labs, stated support hose discussed with Dr Tenny Craw

## 2011-09-06 DIAGNOSIS — S81819A Laceration without foreign body, unspecified lower leg, initial encounter: Secondary | ICD-10-CM | POA: Insufficient documentation

## 2011-09-06 NOTE — Assessment & Plan Note (Signed)
Irrigated copiously, td done at [employer] [employer].  Should not suture or staple given the thin tissue and absence of bleeding.  D/w pt.  She may lose the flap, but no reason to resect it now.  It is protective.  Covered with nonstick bandage and return for wound check later this week.  Routine cautions given.  She understood.

## 2011-09-06 NOTE — Progress Notes (Signed)
Was walking in garage and hit a piece of hard plastic with leg by accident.  No LOC. Cut was cleaned and then she came as a walk in to the clinic.    Meds, vitals, and allergies reviewed.   ROS: See HPI.  Otherwise, noncontributory.  L shin with V shaped laceration.  Central portion is puffy and bruised but the flap is adherent.  Clean borders, each side of the V is ~1.5 inches long.  No FB.  Not bleeding.

## 2011-09-11 DIAGNOSIS — I4891 Unspecified atrial fibrillation: Secondary | ICD-10-CM

## 2011-09-11 DIAGNOSIS — I10 Essential (primary) hypertension: Secondary | ICD-10-CM

## 2011-09-11 DIAGNOSIS — I2510 Atherosclerotic heart disease of native coronary artery without angina pectoris: Secondary | ICD-10-CM

## 2011-09-11 DIAGNOSIS — K5289 Other specified noninfective gastroenteritis and colitis: Secondary | ICD-10-CM

## 2011-09-14 ENCOUNTER — Telehealth: Payer: Self-pay

## 2011-09-14 NOTE — Telephone Encounter (Signed)
Abigail [name] nurse with [facility] needs orders for pt to continue twice a week for 3 weeks due to skin tear on rt lower leg. Need order for Xeroform with non stick pad with roll gauze to leg after cleaning with saline twice a week for 3 more weeks. [name] also wanted to know if Dr Para March can follow pt until May when she has appt with new doctor in [place].(pt moved to [place]). [name] can be reached at [phone] for verbal order.

## 2011-09-15 ENCOUNTER — Telehealth: Payer: Self-pay

## 2011-09-15 NOTE — Telephone Encounter (Signed)
AJ pharmacist at [facility] [phone] said pt came in to get Advair transferred from [facility] to [place] and AJ said pt appeared very confused; giving him florist cards and not answering questions appropriately; AJ concerned about pt and wanted Dr Para March to be aware. Left message for pts son Margo Lama to call back.

## 2011-09-15 NOTE — Telephone Encounter (Signed)
Kelly advised. 

## 2011-09-15 NOTE — Telephone Encounter (Signed)
Noted, agreed.  Thanks. 

## 2011-09-15 NOTE — Telephone Encounter (Signed)
Pts son Reita Cliche called and said he had just spoken to pt and she talked normal and answered his questions appropriately. Reita Cliche said pt cannot hear well and wondered if she did not understand what pharmacist was saying and that may have made her a little anxious. Reita Cliche said he was going to call her again and plans to see her later this afternoon. Reita Cliche said he recently moved his mother to a senior appt in [place]. Reita Cliche will call back if any concern about pts condition or any confusion.

## 2011-09-15 NOTE — Telephone Encounter (Signed)
Please give orders for pt to continue twice a week for 3 weeks due to skin tear on rt lower leg. Need order for Xeroform with non stick pad with roll gauze to leg after cleaning with saline twice a week for 3 more weeks.  I'll follow until set up with new MD.

## 2011-09-15 NOTE — Telephone Encounter (Signed)
Call and see about getting an update on patient condition. Thanks.

## 2011-09-28 ENCOUNTER — Encounter: Payer: Medicare Other | Admitting: Family Medicine

## 2011-09-28 ENCOUNTER — Encounter: Payer: Self-pay | Admitting: Family Medicine

## 2011-09-28 ENCOUNTER — Ambulatory Visit (INDEPENDENT_AMBULATORY_CARE_PROVIDER_SITE_OTHER): Payer: PRIVATE HEALTH INSURANCE | Admitting: Family Medicine

## 2011-09-28 ENCOUNTER — Other Ambulatory Visit: Payer: Self-pay | Admitting: Family Medicine

## 2011-09-28 DIAGNOSIS — Z139 Encounter for screening, unspecified: Secondary | ICD-10-CM

## 2011-09-28 DIAGNOSIS — S81009A Unspecified open wound, unspecified knee, initial encounter: Secondary | ICD-10-CM

## 2011-09-28 DIAGNOSIS — I4891 Unspecified atrial fibrillation: Secondary | ICD-10-CM

## 2011-09-28 DIAGNOSIS — H269 Unspecified cataract: Secondary | ICD-10-CM

## 2011-09-28 DIAGNOSIS — I2510 Atherosclerotic heart disease of native coronary artery without angina pectoris: Secondary | ICD-10-CM

## 2011-09-28 DIAGNOSIS — S91009A Unspecified open wound, unspecified ankle, initial encounter: Secondary | ICD-10-CM

## 2011-09-28 DIAGNOSIS — I10 Essential (primary) hypertension: Secondary | ICD-10-CM

## 2011-09-28 DIAGNOSIS — E785 Hyperlipidemia, unspecified: Secondary | ICD-10-CM

## 2011-09-28 DIAGNOSIS — Z85828 Personal history of other malignant neoplasm of skin: Secondary | ICD-10-CM

## 2011-09-28 DIAGNOSIS — J449 Chronic obstructive pulmonary disease, unspecified: Secondary | ICD-10-CM

## 2011-09-28 DIAGNOSIS — I739 Peripheral vascular disease, unspecified: Secondary | ICD-10-CM

## 2011-09-28 MED ORDER — CEPHALEXIN 500 MG PO CAPS: 500.0000 mg | ORAL_CAPSULE | Freq: Two times a day (BID) | ORAL | Status: AC

## 2011-09-28 NOTE — Patient Instructions (Signed)
I will get you records from your previous doctors I will call Dr. Tenny Craw to let them know about the change  Continue all other meds I will refer you to a dermatologist  I will refer you to an eye doctor  Mammogram to be scheduled at [facility]  F/U 2 months

## 2011-09-29 ENCOUNTER — Encounter: Payer: Self-pay | Admitting: Family Medicine

## 2011-09-29 DIAGNOSIS — Z85828 Personal history of other malignant neoplasm of skin: Secondary | ICD-10-CM | POA: Insufficient documentation

## 2011-09-29 NOTE — Assessment & Plan Note (Signed)
Blood pressure at goal we'll continue atenolol. I reviewed her last cardiology note

## 2011-09-29 NOTE — Assessment & Plan Note (Addendum)
She will continue to follow with wound center, change to keflex for wound

## 2011-09-29 NOTE — Assessment & Plan Note (Signed)
Controlled with diet, does not tolerate statins

## 2011-09-29 NOTE — Progress Notes (Signed)
Subjective:     Patient ID: Abigail [name], female   DOB: [birth year], 78 y.o.   MRN: [id number]  HPI   Review of Systems     Objective:   Physical Exam     Assessment:         Plan:     error

## 2011-09-29 NOTE — Assessment & Plan Note (Signed)
Unchanged, she is also having repeat testing done by wound center secondary to slow healing

## 2011-09-29 NOTE — Assessment & Plan Note (Signed)
By exam she has small parts visible atrial fibrillation. She is on aspirin 81 mg

## 2011-09-29 NOTE — Assessment & Plan Note (Signed)
Currently stable.

## 2011-09-29 NOTE — Assessment & Plan Note (Signed)
History of coronary artery disease currently is without chest pain. She has not used her nitroglycerin. I will be transferring her care to Dr.Rothbart, our local cardiologist associated with [facility]uer

## 2011-09-29 NOTE — Patient Instructions (Signed)
will get you records from your previous doctors I will call Dr. Tenny Craw to let them know about the change  Continue all other meds I will refer you to a dermatologist  I will refer you to an eye doctor  Mammogram to be scheduled at [facility]  F/U 2 months

## 2011-09-29 NOTE — Progress Notes (Signed)
  Subjective:    Patient ID: Abigail [name], female    DOB: 06-14-1919, 78 y.o.   MRN: [id number]  HPI Patient here to establish care. Previous PCP was at [facility] in [facility]  Cardiologist Dr. Corky Sox [facility] Medications and history reviewed She needs a handicap form completed today  COPD-she wears 2 L of oxygen as needed currently on Advair. Her breathing is okay and she does not require any rescue inhaler  The wound to left leg-she has very thin skin and hit the side of the lawnmower last week therefore she is now seen at wound Center. She was given clindamycin however this causes upset stomach and she would like this to be changed. She also is unable to take the medication 3 times a day  Skin cancer-she has a history of multiple skin cancers including lesions removed from the face. She was previously at [facility] dermatology and will need to be changed to a closer dermatology .   Patient will like to have mammogram done    Review of Systems   GEN- denies fatigue, fever, weight loss,+weakness, recent illness HEENT- denies eye drainage, change in vision, nasal discharge, CVS- denies chest pain, palpitations RESP- denies SOB, cough, wheeze ABD- denies N/V, change in stools, abd pain GU- denies dysuria, hematuria, dribbling, incontinence MSK- + joint pain, muscle aches, injury Neuro- denies headache, dizziness, syncope, seizure activity      Objective:   Physical Exam GEN- NAD, alert and oriented x3 HEENT- PERRL, EOMI, non injected sclera, pink conjunctiva, MMM, oropharynx clear Neck- Supple, no bruit CVS- RRR, no murmur RESP-CTAB ABD-NABS,soft, NT,ND  EXT- LLE with bandage applied, venous statis changes, +edema-pitting  Pulses- Radial, DP- 2+ Neuro- moving all 4 ext, CNII-XII in tact Psych-normal affect and mood         Assessment & Plan:

## 2011-09-29 NOTE — Assessment & Plan Note (Signed)
I will obtain her records, and refer to local dermatologist

## 2011-10-03 ENCOUNTER — Ambulatory Visit (HOSPITAL_COMMUNITY)
Admission: RE | Admit: 2011-10-03 | Discharge: 2011-10-03 | Disposition: A | Discharge status: Home / Self Care | Payer: Medicare Other | Source: Ambulatory Visit | Attending: Family Medicine | Admitting: Family Medicine

## 2011-10-03 DIAGNOSIS — Z139 Encounter for screening, unspecified: Secondary | ICD-10-CM

## 2011-10-03 DIAGNOSIS — Z1231 Encounter for screening mammogram for malignant neoplasm of breast: Secondary | ICD-10-CM | POA: Insufficient documentation

## 2011-10-11 DIAGNOSIS — I10 Essential (primary) hypertension: Secondary | ICD-10-CM

## 2011-10-11 DIAGNOSIS — K5289 Other specified noninfective gastroenteritis and colitis: Secondary | ICD-10-CM

## 2011-10-11 DIAGNOSIS — I4891 Unspecified atrial fibrillation: Secondary | ICD-10-CM

## 2011-10-11 DIAGNOSIS — I2510 Atherosclerotic heart disease of native coronary artery without angina pectoris: Secondary | ICD-10-CM

## 2011-11-28 ENCOUNTER — Encounter: Payer: Self-pay | Admitting: Family Medicine

## 2011-11-28 ENCOUNTER — Ambulatory Visit (INDEPENDENT_AMBULATORY_CARE_PROVIDER_SITE_OTHER): Payer: Medicare Other | Admitting: Family Medicine

## 2011-11-28 ENCOUNTER — Telehealth: Payer: Self-pay | Admitting: Internal Medicine

## 2011-11-28 VITALS — BP 130/72 | HR 72 | Resp 18 | Ht 62.0 in | Wt 121.0 lb

## 2011-11-28 DIAGNOSIS — I739 Peripheral vascular disease, unspecified: Secondary | ICD-10-CM

## 2011-11-28 DIAGNOSIS — I2510 Atherosclerotic heart disease of native coronary artery without angina pectoris: Secondary | ICD-10-CM

## 2011-11-28 DIAGNOSIS — Z85828 Personal history of other malignant neoplasm of skin: Secondary | ICD-10-CM

## 2011-11-28 DIAGNOSIS — I10 Essential (primary) hypertension: Secondary | ICD-10-CM

## 2011-11-28 DIAGNOSIS — J449 Chronic obstructive pulmonary disease, unspecified: Secondary | ICD-10-CM

## 2011-11-28 DIAGNOSIS — J4489 Other specified chronic obstructive pulmonary disease: Secondary | ICD-10-CM

## 2011-11-28 NOTE — Telephone Encounter (Signed)
There was an order sent to [facility] and foe pt to transition from provider to them and they can't do that and would like to you

## 2011-11-28 NOTE — Telephone Encounter (Signed)
LMOM for call back from [facility] from [facility].

## 2011-11-28 NOTE — Patient Instructions (Signed)
Continue current medications Call the [facility] ([address]) [phone] Address [address] F/U 3 months

## 2011-11-29 ENCOUNTER — Encounter: Payer: Self-pay | Admitting: Family Medicine

## 2011-11-29 NOTE — Assessment & Plan Note (Signed)
No complaints of worsening claudication

## 2011-11-29 NOTE — Progress Notes (Signed)
  Subjective:    Patient ID: Abigail [name], female    DOB: [birth year], 78 y.o.   MRN: [id number]  HPI Pt here to f/u chronic medical problems, no concerns Doing well. Medications reviewed Seen by Dermatology and opthalmology has new glasses. Cardiology noted she does not need to be seen until 2014.   Review of Systems  GEN- denies fatigue, fever, weight loss,weakness, recent illness HEENT- denies eye drainage, change in vision, nasal discharge, CVS- denies chest pain, palpitations RESP- denies SOB, cough, wheeze ABD- denies N/V, change in stools, abd pain GU- denies dysuria, hematuria, dribbling, incontinence MSK- denies joint pain, muscle aches, injury Neuro- denies headache, dizziness, syncope, seizure activity      Objective:   Physical Exam GEN- NAD, alert and oriented x3 HEENT- PERRL, EOMI, non injected sclera, pink conjunctiva, MMM, oropharynx clear Neck- Supple, no bruit CVS- RRR, no murmur RESP-CTAB ABD-NABS,soft, NT,ND  EXT-  venous statis changes, trace edema Pulses- Radial, DP- 2+       Assessment & Plan:

## 2011-11-29 NOTE — Assessment & Plan Note (Signed)
Well controlled continue atenolol

## 2011-11-29 NOTE — Assessment & Plan Note (Signed)
No chest pain or shortness of breath, f/u cards in early 2014

## 2011-11-29 NOTE — Assessment & Plan Note (Signed)
Breathing stable no change to meds

## 2011-11-29 NOTE — Assessment & Plan Note (Signed)
I will obain note from dermatologist

## 2011-12-12 NOTE — Telephone Encounter (Signed)
Called Abigail [name] back and she advised me that the patient has to stay with her current [facility]2 therapy provider because of insurance issues.

## 2011-12-18 ENCOUNTER — Other Ambulatory Visit: Payer: Self-pay | Admitting: Family Medicine

## 2012-02-06 ENCOUNTER — Ambulatory Visit (INDEPENDENT_AMBULATORY_CARE_PROVIDER_SITE_OTHER): Payer: Medicare Other | Admitting: Family Medicine

## 2012-02-06 ENCOUNTER — Encounter: Payer: Self-pay | Admitting: Family Medicine

## 2012-02-06 VITALS — BP 136/76 | HR 80 | Resp 18 | Ht 62.0 in | Wt 123.0 lb

## 2012-02-06 DIAGNOSIS — R42 Dizziness and giddiness: Secondary | ICD-10-CM

## 2012-02-06 DIAGNOSIS — Z23 Encounter for immunization: Secondary | ICD-10-CM

## 2012-02-06 DIAGNOSIS — I4891 Unspecified atrial fibrillation: Secondary | ICD-10-CM

## 2012-02-06 DIAGNOSIS — I10 Essential (primary) hypertension: Secondary | ICD-10-CM

## 2012-02-06 MED ORDER — MECLIZINE HCL 12.5 MG PO TABS: 12.5000 mg | ORAL_TABLET | Freq: Three times a day (TID) | ORAL | Status: DC | PRN

## 2012-02-06 NOTE — Patient Instructions (Addendum)
Keep hydrated Try the inner ear pill as needed Continue your current medication Call if you do not improve  Keep previous f/u appt

## 2012-02-08 ENCOUNTER — Encounter: Payer: Self-pay | Admitting: Family Medicine

## 2012-02-08 DIAGNOSIS — R42 Dizziness and giddiness: Secondary | ICD-10-CM | POA: Insufficient documentation

## 2012-02-08 NOTE — Progress Notes (Signed)
  Subjective:    Patient ID: Abigail [name], female    DOB: 03-21-1920, 78 y.o.   MRN: [id number]  HPI  Headache and dizzy episodes since last week, now improved. First episode was 1 week ago while sitting out in the sun for too long, she thought it was her blood pressure and began checking with a wrist cuff with values 126-166/77-109, BP checked at fire station 140/60. Denies blurry vision, fall, feeling faint. Dizzy episodes also occur when getting up quickly. She has had some episodes in the past similar to current and was diagnosed with vertigo. No new medications   Review of Systems   GEN- denies fatigue, fever, weight loss,weakness, recent illness HEENT- denies eye drainage, change in vision, nasal discharge, CVS- denies chest pain, palpitations RESP- denies SOB, cough, wheeze ABD- denies N/V, change in stools, abd pain GU- denies dysuria, hematuria, dribbling, incontinence MSK- denies joint pain, muscle aches, injury Neuro- + headache, +dizziness, syncope, seizure activity      Objective:   Physical Exam GEN- NAD, alert and oriented x3 - standing BP 132/ 64 HEENT- PERRL, EOMI, non injected sclera, pink conjunctiva, MMM, oropharynx clear, TM clear bilat Neck- Supple, no bruit CVS- RRR, no murmur RESP-CTAB ABD-NABS,soft, NT,ND  EXT-  venous statis changes, trace edema Pulses- Radial, DP- 2+ Neuro-CNII-XII in tact, no focal deficits, normal finger to nose, no pass pointing, neg rhomberg, normal speech       Assessment & Plan:

## 2012-02-08 NOTE — Assessment & Plan Note (Signed)
Differentials, dehydration, BPV, cardiac, no signs of systemic infection . As symptoms have now calmed down, will advise hydration, trial of meclizine as needed, quick f/u. Given red flags, neuro exam reassuring today.

## 2012-02-08 NOTE — Assessment & Plan Note (Signed)
Fluctuating but no signs of orthostasis. No change to BP meds, her wrist cuff may not be very accurate

## 2012-02-08 NOTE — Assessment & Plan Note (Signed)
PAF, no CP, normal rhythm doubt cause of dizziness at this time as very short lived

## 2012-02-28 ENCOUNTER — Ambulatory Visit: Payer: Medicare Other | Admitting: Family Medicine

## 2012-02-29 ENCOUNTER — Ambulatory Visit (INDEPENDENT_AMBULATORY_CARE_PROVIDER_SITE_OTHER): Payer: Medicare Other | Admitting: Family Medicine

## 2012-02-29 ENCOUNTER — Encounter: Payer: Self-pay | Admitting: Family Medicine

## 2012-02-29 VITALS — BP 118/66 | HR 78 | Resp 15 | Ht 62.0 in | Wt 125.1 lb

## 2012-02-29 DIAGNOSIS — I2510 Atherosclerotic heart disease of native coronary artery without angina pectoris: Secondary | ICD-10-CM

## 2012-02-29 DIAGNOSIS — I10 Essential (primary) hypertension: Secondary | ICD-10-CM

## 2012-02-29 DIAGNOSIS — I4891 Unspecified atrial fibrillation: Secondary | ICD-10-CM

## 2012-02-29 DIAGNOSIS — J449 Chronic obstructive pulmonary disease, unspecified: Secondary | ICD-10-CM

## 2012-02-29 NOTE — Assessment & Plan Note (Signed)
No chest pain doing well on aspirin therapy

## 2012-02-29 NOTE — Assessment & Plan Note (Signed)
Her exam today was irregular however she is asymptomatic and otherwise doing well. Her rate is well controlled. She does have paroxysmal A. fib followed by cardiology

## 2012-02-29 NOTE — Assessment & Plan Note (Signed)
Blood pressure well controlled no changes to medication

## 2012-02-29 NOTE — Progress Notes (Signed)
  Subjective:    Patient ID: Abigail [name], female    DOB: [birth year], 78 y.o.   MRN: [id number]  HPI Patient here to follow chronic medical problems. She has no specific concerns. Dizzy episodes have resolved and she did not use the meclizine. She is doing well and tries to exercise throughout the day. She enjoys spending time with some of the other elderly members in her community. Immunizations up-to-date   Review of Systems  GEN- denies fatigue, fever, weight loss,weakness, recent illness HEENT- denies eye drainage, change in vision, nasal discharge, CVS- denies chest pain, palpitations RESP- denies SOB, cough, wheeze ABD- denies N/V, change in stools, abd pain GU- denies dysuria, hematuria, dribbling, incontinence MSK- denies joint pain, muscle aches, injury Neuro- denies headache, dizziness, syncope, seizure activity      Objective:   Physical Exam  GEN- NAD, alert and oriented x3 - standing BP 132/ 64 HEENT- PERRL, EOMI, non injected sclera, pink conjunctiva, MMM, oropharynx clear, TM clear bilat Neck- Supple, no bruit CVS- irregular rhythm, normal rate, no murmur RESP-CTAB ABD-NABS,soft, NT,ND  EXT-  venous statis changes,no  edema Pulses- Radial 2+      Assessment & Plan:

## 2012-02-29 NOTE — Patient Instructions (Signed)
F/U 4 months  Look at the handout on Living Will and Wishes  Continue current medications

## 2012-02-29 NOTE — Assessment & Plan Note (Signed)
Currently stable. She does have a nebulizer machine at home. Flu shot given last viit

## 2012-03-12 ENCOUNTER — Ambulatory Visit (INDEPENDENT_AMBULATORY_CARE_PROVIDER_SITE_OTHER): Payer: Medicare Other | Admitting: Family Medicine

## 2012-03-12 VITALS — BP 122/60 | HR 64 | Resp 15 | Ht 62.0 in | Wt 126.1 lb

## 2012-03-12 DIAGNOSIS — S81009A Unspecified open wound, unspecified knee, initial encounter: Secondary | ICD-10-CM

## 2012-03-12 DIAGNOSIS — S81809A Unspecified open wound, unspecified lower leg, initial encounter: Secondary | ICD-10-CM

## 2012-03-12 DIAGNOSIS — S81819A Laceration without foreign body, unspecified lower leg, initial encounter: Secondary | ICD-10-CM

## 2012-03-12 MED ORDER — CEPHALEXIN 500 MG PO CAPS: 500.0000 mg | ORAL_CAPSULE | Freq: Two times a day (BID) | ORAL | Status: DC

## 2012-03-12 NOTE — Patient Instructions (Signed)
[facility] to be set up for wound check and care Take the antibiotics twice a day Continue all other medications Tylenol as needed for pain Keep previous f/u appointment

## 2012-03-13 NOTE — Progress Notes (Signed)
  Subjective:    Patient ID: Abigail [name], female    DOB: Sep 22, 1919, 78 y.o.   MRN: [id number]  HPI  Small soda can dropped on pt leg a few days ago, she noticed bump on her left lower leg which began to swell, a blister then popped up, which rubbed off with her sock, no longer bleeding, but has noticed some warmth to the leg and had pain from calf up to knee last night.   Review of Systems  GEN- denies fatigue, fever, weight loss,weakness, recent illness CVS- denies chest pain, palpitations RESP- denies SOB, cough, wheeze MSK- denies joint pain, muscle aches,+ injury Neuro- denies headache, dizziness, syncope, seizure activity      Objective:   Physical Exam GEN- NAD, alert and oriented x3 - standing BP 132/ 64 CVS- irregular rhythm, normal rate, no murmur RESP-CTAB EXT-  venous statis changes,leg leg small hematoma size of dime on left lower leg, no active bleeding or drainage, roof of previous blister decompressed, mild warmth and erythema , no calf tenderness or swelling, no knee effusion + edema left pedal Pulses- Radial 2+ DP equal bilat,  Neuro- ambulating at baseline       Assessment & Plan:

## 2012-03-13 NOTE — Assessment & Plan Note (Signed)
S/p trauma, with small hematoma now and open skin, cleaned at bedside, will cover with antibiotics HHRN to see pt for wound checks, will make wound referral if necessary

## 2012-03-15 ENCOUNTER — Telehealth: Payer: Self-pay

## 2012-03-15 NOTE — Telephone Encounter (Signed)
[facility] has been out yesterday and they will be back Monday per pt

## 2012-03-15 NOTE — Telephone Encounter (Signed)
noted 

## 2012-04-04 DIAGNOSIS — J449 Chronic obstructive pulmonary disease, unspecified: Secondary | ICD-10-CM

## 2012-04-04 DIAGNOSIS — I10 Essential (primary) hypertension: Secondary | ICD-10-CM

## 2012-04-04 DIAGNOSIS — I4891 Unspecified atrial fibrillation: Secondary | ICD-10-CM

## 2012-04-17 ENCOUNTER — Other Ambulatory Visit: Payer: Self-pay | Admitting: Family Medicine

## 2012-05-18 ENCOUNTER — Emergency Department (HOSPITAL_COMMUNITY)
Admission: EM | Admit: 2012-05-18 | Discharge: 2012-05-18 | Disposition: A | Discharge status: Home / Self Care | Payer: Medicare Other | Attending: Emergency Medicine | Admitting: Emergency Medicine

## 2012-05-18 ENCOUNTER — Emergency Department (HOSPITAL_COMMUNITY): Payer: Medicare Other

## 2012-05-18 ENCOUNTER — Encounter (HOSPITAL_COMMUNITY): Payer: Self-pay | Admitting: *Deleted

## 2012-05-18 DIAGNOSIS — Z8614 Personal history of Methicillin resistant Staphylococcus aureus infection: Secondary | ICD-10-CM | POA: Insufficient documentation

## 2012-05-18 DIAGNOSIS — R0609 Other forms of dyspnea: Secondary | ICD-10-CM | POA: Insufficient documentation

## 2012-05-18 DIAGNOSIS — Z85828 Personal history of other malignant neoplasm of skin: Secondary | ICD-10-CM | POA: Insufficient documentation

## 2012-05-18 DIAGNOSIS — I739 Peripheral vascular disease, unspecified: Secondary | ICD-10-CM | POA: Insufficient documentation

## 2012-05-18 DIAGNOSIS — E7800 Pure hypercholesterolemia, unspecified: Secondary | ICD-10-CM | POA: Insufficient documentation

## 2012-05-18 DIAGNOSIS — J449 Chronic obstructive pulmonary disease, unspecified: Secondary | ICD-10-CM | POA: Insufficient documentation

## 2012-05-18 DIAGNOSIS — Z7982 Long term (current) use of aspirin: Secondary | ICD-10-CM | POA: Insufficient documentation

## 2012-05-18 DIAGNOSIS — Z8719 Personal history of other diseases of the digestive system: Secondary | ICD-10-CM | POA: Insufficient documentation

## 2012-05-18 DIAGNOSIS — R609 Edema, unspecified: Secondary | ICD-10-CM | POA: Insufficient documentation

## 2012-05-18 DIAGNOSIS — Z862 Personal history of diseases of the blood and blood-forming organs and certain disorders involving the immune mechanism: Secondary | ICD-10-CM | POA: Insufficient documentation

## 2012-05-18 DIAGNOSIS — Z79899 Other long term (current) drug therapy: Secondary | ICD-10-CM | POA: Insufficient documentation

## 2012-05-18 DIAGNOSIS — J45909 Unspecified asthma, uncomplicated: Secondary | ICD-10-CM | POA: Insufficient documentation

## 2012-05-18 DIAGNOSIS — I4891 Unspecified atrial fibrillation: Secondary | ICD-10-CM | POA: Insufficient documentation

## 2012-05-18 DIAGNOSIS — R600 Localized edema: Secondary | ICD-10-CM

## 2012-05-18 DIAGNOSIS — Z860100 Personal history of colon polyps, unspecified: Secondary | ICD-10-CM | POA: Insufficient documentation

## 2012-05-18 DIAGNOSIS — R0989 Other specified symptoms and signs involving the circulatory and respiratory systems: Secondary | ICD-10-CM | POA: Insufficient documentation

## 2012-05-18 DIAGNOSIS — Z8639 Personal history of other endocrine, nutritional and metabolic disease: Secondary | ICD-10-CM | POA: Insufficient documentation

## 2012-05-18 DIAGNOSIS — D649 Anemia, unspecified: Secondary | ICD-10-CM | POA: Insufficient documentation

## 2012-05-18 DIAGNOSIS — Z8781 Personal history of (healed) traumatic fracture: Secondary | ICD-10-CM | POA: Insufficient documentation

## 2012-05-18 DIAGNOSIS — Z85038 Personal history of other malignant neoplasm of large intestine: Secondary | ICD-10-CM | POA: Insufficient documentation

## 2012-05-18 DIAGNOSIS — R0600 Dyspnea, unspecified: Secondary | ICD-10-CM

## 2012-05-18 DIAGNOSIS — Z87891 Personal history of nicotine dependence: Secondary | ICD-10-CM | POA: Insufficient documentation

## 2012-05-18 DIAGNOSIS — J4489 Other specified chronic obstructive pulmonary disease: Secondary | ICD-10-CM | POA: Insufficient documentation

## 2012-05-18 DIAGNOSIS — I2510 Atherosclerotic heart disease of native coronary artery without angina pectoris: Secondary | ICD-10-CM | POA: Insufficient documentation

## 2012-05-18 LAB — CBC
HCT: 42.3 % (ref 36.0–46.0)
Hemoglobin: 14.3 g/dL (ref 12.0–15.0)
MCH: 34.4 pg — ABNORMAL HIGH (ref 26.0–34.0)
MCHC: 33.8 g/dL (ref 30.0–36.0)
MCV: 101.7 fL — ABNORMAL HIGH (ref 78.0–100.0)
Platelets: 97 10*3/uL — ABNORMAL LOW (ref 150–400)
RBC: 4.16 MIL/uL (ref 3.87–5.11)
RDW: 14.1 % (ref 11.5–15.5)
WBC: 5.3 10*3/uL (ref 4.0–10.5)

## 2012-05-18 LAB — BASIC METABOLIC PANEL
BUN: 16 mg/dL (ref 6–23)
CO2: 30 mEq/L (ref 19–32)
Calcium: 9.8 mg/dL (ref 8.4–10.5)
Chloride: 102 mEq/L (ref 96–112)
Creatinine, Ser: 0.95 mg/dL (ref 0.50–1.10)
GFR calc Af Amer: 59 mL/min — ABNORMAL LOW (ref >90)
GFR calc non Af Amer: 51 mL/min — ABNORMAL LOW (ref >90)
Glucose, Bld: 114 mg/dL — ABNORMAL HIGH (ref 70–99)
Potassium: 4.2 mEq/L (ref 3.5–5.1)
Sodium: 140 mEq/L (ref 135–145)

## 2012-05-18 LAB — PRO B NATRIURETIC PEPTIDE: Pro B Natriuretic peptide (BNP): 2828 pg/mL — ABNORMAL HIGH (ref 0–450)

## 2012-05-18 LAB — TROPONIN I: Troponin I: <0.3 ng/mL (ref <0.30)

## 2012-05-18 MED ORDER — FUROSEMIDE 10 MG/ML IJ SOLN
20.0000 mg | Freq: Once | INTRAMUSCULAR | Status: AC
  Administered 2012-05-18: 20 mg via INTRAVENOUS
  Filled 2012-05-18: qty 2

## 2012-05-18 MED ORDER — POTASSIUM CHLORIDE ER 10 MEQ PO TBCR: 20.0000 meq | EXTENDED_RELEASE_TABLET | Freq: Two times a day (BID) | ORAL | Status: DC

## 2012-05-18 MED ORDER — DILTIAZEM HCL 25 MG/5ML IV SOLN
10.0000 mg | Freq: Once | INTRAVENOUS | Status: AC
  Administered 2012-05-18: 10 mg via INTRAVENOUS
  Filled 2012-05-18: qty 5

## 2012-05-18 MED ORDER — DILTIAZEM HCL ER COATED BEADS 120 MG PO CP24
120.0000 mg | ORAL_CAPSULE | Freq: Once | ORAL | Status: AC
  Administered 2012-05-18: 120 mg via ORAL
  Filled 2012-05-18: qty 1

## 2012-05-18 MED ORDER — DILTIAZEM HCL ER COATED BEADS 120 MG PO CP24: 120.0000 mg | ORAL_CAPSULE | Freq: Every day | ORAL | Status: DC

## 2012-05-18 MED ORDER — DILTIAZEM HCL 100 MG IV SOLR
5.0000 mg/h | Freq: Once | INTRAVENOUS | Status: AC
  Administered 2012-05-18: 5 mg/h via INTRAVENOUS
  Filled 2012-05-18: qty 100

## 2012-05-18 MED ORDER — FUROSEMIDE 20 MG PO TABS: 20.0000 mg | ORAL_TABLET | Freq: Two times a day (BID) | ORAL | Status: DC

## 2012-05-18 MED ORDER — POTASSIUM CHLORIDE CRYS ER 20 MEQ PO TBCR
20.0000 meq | EXTENDED_RELEASE_TABLET | Freq: Once | ORAL | Status: AC
  Administered 2012-05-18: 20 meq via ORAL
  Filled 2012-05-18: qty 1

## 2012-05-18 NOTE — ED Provider Notes (Signed)
History  This chart was scribed for Abigail Razor, MD by Shari Heritage, ED Scribe. The patient was seen in room APA06/APA06. Patient's care was started at 0802.  CSN: [id number]  Arrival date & time 05/18/12  9528   First MD Initiated Contact with Patient 05/18/12 0802      Chief Complaint  Patient presents with  . Shortness of Breath     The history is provided by the patient. No language interpreter was used.    HPI Comments: Abigail [name] is a 78 y.o. female with a history of asthma, coronary artery disease, PVD, a-fib, hypertension, COPD who presents to the Emergency Department complaining of gradual, increased shortness of breath onset 2 days ago. There is associated bilateral leg swelling. Patient denies wheezing, cough, fever, chills, nausea, vomiting, diarrhea or any pain. Patient says that she feels short of breath while sitting at rest. She states that she wears oxygen at home as needed and her breathing improves while using it. Patient denies any history of blood clots. She states that she takes aspirin daily, but no other medicines.   Past Medical History  Diagnosis Date  . Asthma   . Heart disease   . Colon cancer   . CAD (coronary artery disease)     s/p PTCA RCA (Dr. Juliann Pares)  . PVD (peripheral vascular disease)   . Atrial fibrillation   . Hypertension   . Amenia   . COPD (chronic obstructive pulmonary disease) 2008    emphysemaous with asthmatic component-FEV 2008 34%  . Rectal bleeding   . SBO (small bowel obstruction)   . MRSA (methicillin resistant Staphylococcus aureus)   . Nontoxic multinodular goiter   . History of esophagitis     gastritis, duodenitis.  EGD neg 10/20/02  . Tachycardia   . Hypercholesterolemia   . Arteriovenous malformation of duodenum   . tobacco abuse, history of   . atherosclerosis   . Leg cramps     with mild claudication  . Sinus tachycardia   . Hip fracture     right, pins 2009  . Diverticulosis of colon   . Hx of colonic  polyps   . Gall stones 10/1997    abd [place]  . Goiter     thyroid US- bx 06/26/03  . Femoral neck fracture     ORIF, Dr. Yisroel Ramming 02/20/08  . Compression fracture of vertebra     T4/ kyphoplasty, hosp 12/31-06/02/10  . Colon cancer     Colonoscopy wnl 10/21/02  . Carcinoma of colon     s/p resection  . Skin cancer     L forearm 2012, per derm    Past Surgical History  Procedure Date  . Abdominal hysterectomy   . Hip fracture surgery   . Colon resection 05/1997    cancer and carcinoid tumor  . Ileostomy 05/1997    takedown 7/99  . Cardiac catheterization     stent/ ER, ok LE's 10/00. PV airtigraogy- stent 10/00  . Angioplasty 7/00    R CA  . Abdominal surgery     incarcerated ventral hernia, MRSA 06/28/00  . Cataract extraction 2004    bilateral  . Total abdominal hysterectomy     Family History  Problem Relation Age of Onset  . Kidney disease Mother     died of kidney failure  . Cancer Father     stomach    History  Substance Use Topics  . Smoking status: Former Games developer  . Smokeless tobacco: Never  Used     Comment: quit 10-11 years ago  . Alcohol Use: No    OB History    Grav Para Term Preterm Abortions TAB SAB Ect Mult Living                  Review of Systems  Constitutional: Negative for fever and chills.  Respiratory: Positive for shortness of breath. Negative for cough and wheezing.   Cardiovascular: Positive for leg swelling.  Gastrointestinal: Negative for nausea, vomiting and diarrhea.  Musculoskeletal: Negative for myalgias and arthralgias.  All other systems reviewed and are negative.    Allergies  Budesonide-formoterol fumarate; Diltiazem; Metoprolol; Sulfonamide derivatives; and Sulfur  Home Medications   Current Outpatient Rx  Name  Route  Sig  Dispense  Refill  . ACETAMINOPHEN 500 MG PO TABS      Take one as needed as instructed on the bottle          . ADVAIR DISKUS 250-50 MCG/DOSE IN AEPB      INHALE 1 PUFF BY MOUTH TWICE DAILY.  RINSE MOUTH AFTER USE.   60 each   3   . ALBUTEROL SULFATE HFA 108 (90 BASE) MCG/ACT IN AERS      One puff by mouth every 2-4 hours as needed         . ASPIRIN 81 MG PO TABS   Oral   Take 81 mg by mouth daily.           . ATENOLOL 25 MG PO TABS   Oral   Take 25 mg by mouth daily.         Marland Kitchen BIOTIN 1000 MCG PO TABS   Oral   Take 1,000 mcg by mouth daily.           Marland Kitchen CALCIUM 1500 MG PO TABS   Oral   Take 1,500 mg by mouth daily.           Marland Kitchen VITAMIN D 1000 UNITS PO TABS   Oral   Take 1,000 Units by mouth daily.           Marland Kitchen VITAMIN B-12 500 MCG PO LOZG   Oral   Take by mouth daily.           Marland Kitchen FERROUS FUMARATE 325 (106 FE) MG PO TABS   Oral   Take by mouth daily.           Marland Kitchen GRAPE SEED PO      Take 2 by mouth daily          . MECLIZINE HCL 12.5 MG PO TABS   Oral   Take 1 tablet (12.5 mg total) by mouth 3 (three) times daily as needed for dizziness or nausea.   30 tablet   1   . CENTRUM SILVER PO   Oral   Take by mouth daily.           Marland Kitchen NITROGLYCERIN 0.4 MG SL SUBL      Place one under tongue every 5 minutes x 3 as needed   25 tablet   1   . NON FORMULARY      Use 2.5 liters daily as needed         . FISH OIL 1000 MG PO CAPS   Oral   Take by mouth daily.           Marland Kitchen OMEPRAZOLE 20 MG PO CPDR   Oral   Take 20 mg by mouth daily.  Triage Vitals: BP 135/92  Pulse 111  Temp 98 F (36.7 C) (Oral)  Resp 23  SpO2 96%  Physical Exam  Nursing note and vitals reviewed. Constitutional: She is oriented to person, place, and time. She appears well-developed and well-nourished. No distress.  HENT:  Head: Normocephalic and atraumatic.  Eyes: Conjunctivae normal are normal. Right eye exhibits no discharge. Left eye exhibits no discharge.  Neck: Neck supple.  Cardiovascular: An irregularly irregular rhythm present. Tachycardia present.  Exam reveals no gallop and no friction rub.   No murmur heard. Pulmonary/Chest:  Breath sounds normal. Tachypnea (mildly) noted. No respiratory distress.  Abdominal: Soft. She exhibits no distension. There is no tenderness.  Musculoskeletal: She exhibits edema. She exhibits no tenderness.       Symmetric pitting lower extremity edema.  Neurological: She is alert and oriented to person, place, and time.  Skin: Skin is warm and dry.  Psychiatric: She has a normal mood and affect. Her behavior is normal. Thought content normal.    ED Course  Procedures (including critical care time) DIAGNOSTIC STUDIES: Oxygen Saturation is 96% on room air, adequate by my interpretation.    COORDINATION OF CARE: 8:19 AM- Patient informed of current plan for treatment and evaluation and agrees with plan at this time.   Results for orders placed during the hospital encounter of 05/18/12  CBC      Component Value Range   WBC 5.3  4.0 - 10.5 K/uL   RBC 4.16  3.87 - 5.11 MIL/uL   Hemoglobin 14.3  12.0 - 15.0 g/dL   HCT 16.1  09.6 - 04.5 %   MCV 101.7 (*) 78.0 - 100.0 fL   MCH 34.4 (*) 26.0 - 34.0 pg   MCHC 33.8  30.0 - 36.0 g/dL   RDW 40.9  81.1 - 91.4 %   Platelets 97 (*) 150 - 400 K/uL  BASIC METABOLIC PANEL      Component Value Range   Sodium 140  135 - 145 mEq/L   Potassium 4.2  3.5 - 5.1 mEq/L   Chloride 102  96 - 112 mEq/L   CO2 30  19 - 32 mEq/L   Glucose, Bld 114 (*) 70 - 99 mg/dL   BUN 16  6 - 23 mg/dL   Creatinine, Ser 7.82  0.50 - 1.10 mg/dL   Calcium 9.8  8.4 - 95.6 mg/dL   GFR calc non Af Amer 51 (*) >90 mL/min   GFR calc Af Amer 59 (*) >90 mL/min  TROPONIN I      Component Value Range   Troponin I <0.30  <0.30 ng/mL  PRO B NATRIURETIC PEPTIDE      Component Value Range   Pro B Natriuretic peptide (BNP) 2828.0 (*) 0 - 450 pg/mL    Dg Chest 2 View  05/18/2012  *RADIOLOGY REPORT*  Clinical Data: Weakness and shortness of breath.  CHEST - 2 VIEW  Comparison: PA and lateral chest 04/30/2010.  Findings: The chest is hyperexpanded with coarsening of the pulmonary  interstitium.  Basilar scarring is noted.  No consolidative process, pneumothorax or effusion is identified. There is cardiomegaly.  Postoperative change of upper thoracic vertebral augmentation is noted.  IMPRESSION: No acute finding.   Original Report Authenticated By: Holley Dexter, M.D.    EKG:  Rhythm: atrial fibrillation with RVR Vent. rate 133 BPM PR interval * ms QRS duration 142 ms QT/QTc 306/455 ms RBBB Axis: Left ST segments: NS ST changes   1. Dyspnea  2. Lower extremity edema   3. Atrial fibrillation with RVR       MDM  89yF with dyspnea. No distress on exam. Lungs clear on auscultation. CXR without focal findings. Pt in afib with rvr 110-130 on arrival. Better control in 80-100s prior to DC. Has hx of same. On atenolol. May need change in rate controlling meds. Understands need to follow-up with cardiologist or PCP as soon as can. LE edema. Doubt dvt. Plan short course of lasix. Renal function normal. Pt ambulated in ED prior to DC without assistance or apparent distress. Doubt PE. Doubt ACs equivalent. Thrombocytopenic but chronic per review of records.       I personally preformed the services scribed in my presence. The recorded information has been reviewed and is correct. Abigail Razor, MD.    Abigail Razor, MD 05/19/12 [id number]

## 2012-05-18 NOTE — ED Notes (Signed)
Increased sob x 2 days with bil lower extremity edema.  Denies cough/fever.

## 2012-05-23 ENCOUNTER — Ambulatory Visit (INDEPENDENT_AMBULATORY_CARE_PROVIDER_SITE_OTHER): Payer: Medicare Other | Admitting: Family Medicine

## 2012-05-23 ENCOUNTER — Encounter: Payer: Self-pay | Admitting: Family Medicine

## 2012-05-23 VITALS — BP 92/62 | HR 78 | Resp 16 | Wt 122.1 lb

## 2012-05-23 DIAGNOSIS — I4891 Unspecified atrial fibrillation: Secondary | ICD-10-CM

## 2012-05-23 DIAGNOSIS — J4489 Other specified chronic obstructive pulmonary disease: Secondary | ICD-10-CM

## 2012-05-23 DIAGNOSIS — I739 Peripheral vascular disease, unspecified: Secondary | ICD-10-CM

## 2012-05-23 DIAGNOSIS — J449 Chronic obstructive pulmonary disease, unspecified: Secondary | ICD-10-CM

## 2012-05-23 DIAGNOSIS — I10 Essential (primary) hypertension: Secondary | ICD-10-CM

## 2012-05-23 NOTE — Assessment & Plan Note (Signed)
At baseline, no complaints of pain, swelling per above

## 2012-05-23 NOTE — Assessment & Plan Note (Signed)
BP on softer side, due to additional lasix this week, no change to atenolol

## 2012-05-23 NOTE — Assessment & Plan Note (Signed)
She had an episode of a fib with RVR which resulted in CHF like picture, BNP > 2000. S/P course of lasix with BP on softer side, continue atenolol, will not continue lasix, she is at baseline, will have her see cardiology , previously Dr. Dietrich Pates, no recent echo but will defer to cardiology

## 2012-05-23 NOTE — Assessment & Plan Note (Signed)
Currently stable, no change to inhalers 

## 2012-05-23 NOTE — Patient Instructions (Addendum)
Appointment with Dr. Dietrich Pates heart doctor Stop the biotin and magnesium You do not need the water pill or potassium any more  Continue all other meds Rest and try not to over do it Call me if you want to switch oxygen  F/U as scheduled in Feb

## 2012-05-23 NOTE — Progress Notes (Signed)
  Subjective:    Patient ID: Abigail [name], female    DOB: 04/08/1920, 78 y.o.   MRN: [id number]  HPI Pt presents to f/u ER visit 12/21 for SOB and leg swelling, found to be in A fib with RVR, after rate controlled, breathing improved, given lasix x 3 days with potassium, edema now resolved, breathing at baseline. Son is inquiring about changing oxygen  Medications reviewed No chest pain, no fever, no URI symptoms   Review of Systems  GEN- denies fatigue, fever, weight loss,weakness, recent illness HEENT- denies eye drainage, change in vision, nasal discharge, CVS- denies chest pain, palpitations RESP- denies SOB, cough, wheeze ABD- denies N/V, change in stools, abd pain GU- denies dysuria, hematuria, dribbling, incontinence MSK- denies joint pain, muscle aches, injury Neuro- denies headache, dizziness, syncope, seizure activity      Objective:   Physical Exam GEN- NAD, alert and oriented x3 HEENT- PERRL, EOMI, non injected sclera, pink conjunctiva, MMM, oropharynx clear Neck- Supple, no JVD CVS- RRR, no murmur RESP-CTAB ABD-NABS,soft,NT,ND EXT- No edema, venous statis changes Pulses- Radial, DP- 2+        Assessment & Plan:

## 2012-05-31 ENCOUNTER — Encounter: Payer: Self-pay | Admitting: *Deleted

## 2012-05-31 ENCOUNTER — Ambulatory Visit (INDEPENDENT_AMBULATORY_CARE_PROVIDER_SITE_OTHER): Payer: Medicare Other | Admitting: Internal Medicine

## 2012-05-31 VITALS — BP 110/68 | HR 92 | Ht 64.0 in | Wt 126.0 lb

## 2012-05-31 DIAGNOSIS — I10 Essential (primary) hypertension: Secondary | ICD-10-CM

## 2012-05-31 DIAGNOSIS — I2510 Atherosclerotic heart disease of native coronary artery without angina pectoris: Secondary | ICD-10-CM

## 2012-05-31 DIAGNOSIS — I4891 Unspecified atrial fibrillation: Secondary | ICD-10-CM

## 2012-05-31 DIAGNOSIS — R0602 Shortness of breath: Secondary | ICD-10-CM

## 2012-05-31 LAB — CBC
MCH: 33.7 pg (ref 26.0–34.0)
MCV: 101.6 fL — ABNORMAL HIGH (ref 78.0–100.0)
Platelets: 140 10*3/uL — ABNORMAL LOW (ref 150–400)
RDW: 13.9 % (ref 11.5–15.5)
WBC: 7.6 10*3/uL (ref 4.0–10.5)

## 2012-05-31 NOTE — Patient Instructions (Addendum)
Your physician has requested that you have an echocardiogram. Echocardiography is a painless test that uses sound waves to create images of your heart. It provides your doctor with information about the size and shape of your heart and how well your heart's chambers and valves are working. This procedure takes approximately one hour. There are no restrictions for this procedure.  Your physician has requested that you have a lexiscan myoview. For further information please visit [email or web address] Please follow instruction sheet, as given.  Your physician recommends that you return for lab work in: TODAY (CBC,BNP,BMET) SLIPS GIVEN  Your physician recommends that you schedule a follow-up appointment in: TO BE DETERMINED BY TEST RESULTS

## 2012-05-31 NOTE — Progress Notes (Signed)
HPI s. Abigail [name] is an 79 year old with a hsitory of CAD (cath 2002: nonobstructive CAD; Cardiolite 2005 Normal), CV disease, PVOD, dyslipidemia, COPD and PAF. She was last in cardiology clinic in April 2013 SInce seen she says she is more tired  Gives out easier  Denies CP  Says there is a definitedifference.  Allergies  Allergen Reactions  . Budesonide-Formoterol Fumarate     REACTION: STATES FEELS AS IF HAVING HEART ATTACK  . Diltiazem     Low pulse  . Metoprolol     Low pulse  . Sulfa Antibiotics Other (See Comments)    "just doesn't agree with me"  . Sulfonamide Derivatives     REACTION: unspecified    Current Outpatient Prescriptions  Medication Sig Dispense Refill  . acetaminophen (TYLENOL) 500 MG tablet Take 500 mg by mouth at bedtime.       Marland Kitchen ADVAIR DISKUS 250-50 MCG/DOSE AEPB INHALE 1 PUFF BY MOUTH TWICE DAILY. RINSE MOUTH AFTER USE.  60 each  3  . aspirin 81 MG tablet Take 81 mg by mouth daily.        Marland Kitchen atenolol (TENORMIN) 25 MG tablet Take 25 mg by mouth daily.      . Calcium 1500 MG tablet Take 1,500 mg by mouth daily.        . cholecalciferol (VITAMIN D) 1000 UNITS tablet Take 1,000 Units by mouth daily.        . Cyanocobalamin (VITAMIN B-12) 500 MCG LOZG Take 500 mcg by mouth daily.       . furosemide (LASIX) 20 MG tablet Take 20 mg by mouth daily.       Marland Kitchen GRAPE SEED PO Take 1 capsule by mouth 2 (two) times daily. Take 2 by mouth daily      . Multiple Vitamins-Minerals (CENTRUM SILVER PO) Take 1 tablet by mouth daily.       . nitroGLYCERIN (NITROSTAT) 0.4 MG SL tablet Place one under tongue every 5 minutes x 3 as needed  25 tablet  1  . Omega-3 Fatty Acids (FISH OIL) 1000 MG CAPS Take 1 capsule by mouth 2 (two) times daily.       Marland Kitchen omeprazole (PRILOSEC) 20 MG capsule Take 20 mg by mouth daily.        . potassium chloride (K-DUR,KLOR-CON) 10 MEQ tablet Take 10 mEq by mouth daily.         Past Medical History  Diagnosis Date  . Asthma   . Heart disease   . Colon cancer    . CAD (coronary artery disease)     s/p PTCA RCA (Dr. Juliann Pares)  . PVD (peripheral vascular disease)   . Atrial fibrillation   . Hypertension   . Amenia   . COPD (chronic obstructive pulmonary disease) 2008    emphysemaous with asthmatic component-FEV 2008 34%  . Rectal bleeding   . SBO (small bowel obstruction)   . MRSA (methicillin resistant Staphylococcus aureus)   . Nontoxic multinodular goiter   . History of esophagitis     gastritis, duodenitis.  EGD neg 10/20/02  . Tachycardia   . Hypercholesterolemia   . Arteriovenous malformation of duodenum   . tobacco abuse, history of   . atherosclerosis   . Leg cramps     with mild claudication  . Sinus tachycardia   . Hip fracture     right, pins 2009  . Diverticulosis of colon   . Hx of colonic polyps   . Gall stones 10/1997  abd [place]  . Goiter     thyroid US- bx 06/26/03  . Femoral neck fracture     ORIF, Dr. Yisroel Ramming 02/20/08  . Compression fracture of vertebra     T4/ kyphoplasty, hosp 12/31-06/02/10  . Colon cancer     Colonoscopy wnl 10/21/02  . Carcinoma of colon     s/p resection  . Skin cancer     L forearm 2012, per derm    Past Surgical History  Procedure Date  . Abdominal hysterectomy   . Hip fracture surgery   . Colon resection 05/1997    cancer and carcinoid tumor  . Ileostomy 05/1997    takedown 7/99  . Cardiac catheterization     stent/ ER, ok LE's 10/00. PV airtigraogy- stent 10/00  . Angioplasty 7/00    R CA  . Abdominal surgery     incarcerated ventral hernia, MRSA 06/28/00  . Cataract extraction 2004    bilateral  . Total abdominal hysterectomy     Family History  Problem Relation Age of Onset  . Kidney disease Mother     died of kidney failure  . Cancer Father     stomach    History   Social History  . Marital Status: Widowed    Spouse Name: N/A    Number of Children: N/A  . Years of Education: N/A   Occupational History  . Not on file.   Social History Main Topics  . Smoking  status: Former Games developer  . Smokeless tobacco: Never Used     Comment: quit 10-11 years ago  . Alcohol Use: No  . Drug Use: No  . Sexually Active: Not on file   Other Topics Concern  . Not on file   Social History Narrative   ** Merged History Encounter ** Daughter with colon cancer, living at home with patient as of 2/12.    Review of Systems:  All systems reviewed.  They are negative to the above problem except as previously stated.  Vital Signs: BP 110/68  Pulse 92  Ht 5\' 4"  (1.626 m)  Wt 126 lb (57.153 kg)  BMI 21.63 kg/m2  SpO2 93%  Physical Exam Patient is i in NAD HEENT:  Normocephalic, atraumatic. EOMI, PERRLA.  Neck: JVP is normal.  No bruits.  Lungs: clear to auscultation. No rales no wheezes.  Heart: Regular rate and rhythm. Normal S1, S2. No S3.   No significant murmurs. PMI not displaced.  Abdomen:  Supple, nontender. Normal bowel sounds. No masses. No hepatomegaly.  Extremities:   Good distal pulses throughout. No lower extremity edema.  Musculoskeletal :moving all extremities.  Neuro:   alert and oriented x3.  CN II-XII grossly intact.  EKG  Afib  113 bpm.  RBBB Assessment and Plan:  1.  Dyspnea.  Concerning  Does not apear to be volume overloaded on exam.  Question worsening CAD. Will check labs as well as sched lexiscan myoview  2.  CAD  As above.  3.   HL  Will need to review.  Has not tolerated statins in past.

## 2012-06-01 LAB — BASIC METABOLIC PANEL
BUN: 17 mg/dL (ref 6–23)
Creat: 1.43 mg/dL — ABNORMAL HIGH (ref 0.50–1.10)

## 2012-06-01 LAB — BRAIN NATRIURETIC PEPTIDE: Brain Natriuretic Peptide: 230.2 pg/mL — ABNORMAL HIGH (ref 0.0–100.0)

## 2012-06-07 ENCOUNTER — Encounter (HOSPITAL_COMMUNITY)
Admission: RE | Admit: 2012-06-07 | Discharge: 2012-06-07 | Disposition: A | Discharge status: Home / Self Care | Payer: Medicare Other | Source: Ambulatory Visit | Attending: Internal Medicine | Admitting: Internal Medicine

## 2012-06-07 ENCOUNTER — Ambulatory Visit (HOSPITAL_COMMUNITY)
Admission: RE | Admit: 2012-06-07 | Discharge: 2012-06-07 | Disposition: A | Discharge status: Home / Self Care | Payer: Medicare Other | Source: Ambulatory Visit | Attending: Cardiology | Admitting: Cardiology

## 2012-06-07 ENCOUNTER — Encounter (HOSPITAL_COMMUNITY): Payer: Self-pay | Admitting: Cardiology

## 2012-06-07 DIAGNOSIS — I2510 Atherosclerotic heart disease of native coronary artery without angina pectoris: Secondary | ICD-10-CM

## 2012-06-07 DIAGNOSIS — I4891 Unspecified atrial fibrillation: Secondary | ICD-10-CM

## 2012-06-07 DIAGNOSIS — J4489 Other specified chronic obstructive pulmonary disease: Secondary | ICD-10-CM | POA: Insufficient documentation

## 2012-06-07 DIAGNOSIS — R0609 Other forms of dyspnea: Secondary | ICD-10-CM | POA: Insufficient documentation

## 2012-06-07 DIAGNOSIS — I10 Essential (primary) hypertension: Secondary | ICD-10-CM

## 2012-06-07 DIAGNOSIS — R0602 Shortness of breath: Secondary | ICD-10-CM

## 2012-06-07 DIAGNOSIS — I369 Nonrheumatic tricuspid valve disorder, unspecified: Secondary | ICD-10-CM

## 2012-06-07 DIAGNOSIS — R0989 Other specified symptoms and signs involving the circulatory and respiratory systems: Secondary | ICD-10-CM | POA: Insufficient documentation

## 2012-06-07 DIAGNOSIS — J449 Chronic obstructive pulmonary disease, unspecified: Secondary | ICD-10-CM | POA: Insufficient documentation

## 2012-06-07 MED ORDER — TECHNETIUM TC 99M SESTAMIBI GENERIC - CARDIOLITE
10.0000 | Freq: Once | INTRAVENOUS | Status: AC | PRN
  Administered 2012-06-07: 10.9 via INTRAVENOUS

## 2012-06-07 MED ORDER — SODIUM CHLORIDE 0.9 % IJ SOLN
INTRAMUSCULAR | Status: AC
  Administered 2012-06-07: 10 mL via INTRAVENOUS
  Filled 2012-06-07: qty 10

## 2012-06-07 MED ORDER — REGADENOSON 0.4 MG/5ML IV SOLN
INTRAVENOUS | Status: AC
  Administered 2012-06-07: 0.4 mg via INTRAVENOUS
  Filled 2012-06-07: qty 5

## 2012-06-07 NOTE — Progress Notes (Signed)
Stress Lab Nurses Notes - Abigail [name]  Abigail [name] 06/07/2012 Reason for doing test: CAD, SOB, & Afib Type of test: Marlane Hatcher Nurse performing test: Parke Poisson, RN Nuclear Medicine Tech: Dillard Essex Echo Tech: Not Applicable MD performing test: R. Rothbart & Joni Reining NP Family MD: Dr. Jeanice Lim Test explained and consent signed: yes IV started: 22g jelco, Saline lock flushed, No redness or edema and Saline lock started in radiology Symptoms: Flushed & SOB Treatment/Intervention: None Reason test stopped: protocol completed After recovery IV was: Discontinued via X-Sipe tech and No redness or edema Patient to return to Nuc. Med at : 12:45 Patient discharged: Home Patient's Condition upon discharge was: stable Comments: During test BP 121/65 & HR 142.  Recovery BP 126/74 & HR 122.  Symptoms resolved in recovery. [name] T

## 2012-06-07 NOTE — Progress Notes (Signed)
*  PRELIMINARY RESULTS* Echocardiogram 2D Echocardiogram has been performed.  Conrad Forgan 06/07/2012, 1:47 PM

## 2012-07-01 ENCOUNTER — Encounter: Payer: Self-pay | Admitting: Family Medicine

## 2012-07-01 ENCOUNTER — Ambulatory Visit (INDEPENDENT_AMBULATORY_CARE_PROVIDER_SITE_OTHER): Payer: Medicare Other | Admitting: Family Medicine

## 2012-07-01 ENCOUNTER — Telehealth: Payer: Self-pay | Admitting: Family Medicine

## 2012-07-01 VITALS — BP 118/64 | HR 80 | Resp 16 | Ht 62.0 in | Wt 128.0 lb

## 2012-07-01 DIAGNOSIS — R609 Edema, unspecified: Secondary | ICD-10-CM

## 2012-07-01 DIAGNOSIS — I4891 Unspecified atrial fibrillation: Secondary | ICD-10-CM

## 2012-07-01 DIAGNOSIS — R0609 Other forms of dyspnea: Secondary | ICD-10-CM

## 2012-07-01 DIAGNOSIS — I10 Essential (primary) hypertension: Secondary | ICD-10-CM

## 2012-07-01 DIAGNOSIS — R0989 Other specified symptoms and signs involving the circulatory and respiratory systems: Secondary | ICD-10-CM

## 2012-07-01 DIAGNOSIS — I6529 Occlusion and stenosis of unspecified carotid artery: Secondary | ICD-10-CM

## 2012-07-01 DIAGNOSIS — R0600 Dyspnea, unspecified: Secondary | ICD-10-CM

## 2012-07-01 DIAGNOSIS — J449 Chronic obstructive pulmonary disease, unspecified: Secondary | ICD-10-CM

## 2012-07-01 MED ORDER — POTASSIUM CHLORIDE CRYS ER 10 MEQ PO TBCR: 10.0000 meq | EXTENDED_RELEASE_TABLET | Freq: Every day | ORAL | Status: DC

## 2012-07-01 MED ORDER — FUROSEMIDE 20 MG PO TABS: 20.0000 mg | ORAL_TABLET | Freq: Every day | ORAL | Status: DC | PRN

## 2012-07-01 NOTE — Assessment & Plan Note (Signed)
Will await the echocardiogram results as in a message to her cardiologist. She will use the Lasix as needed and take this along with potassium supplement

## 2012-07-01 NOTE — Telephone Encounter (Signed)
Okay to fill 30 tabs, 3 refill

## 2012-07-01 NOTE — Patient Instructions (Addendum)
Wear the oxygen during the day Continue the advair  We will check on results from the heart doctor  Take the lasix as needed for leg swelling , take potassium with this  You will be set up for the carotid artery test  F/U 2 months

## 2012-07-01 NOTE — Progress Notes (Signed)
  Subjective:    Patient ID: Abigail [name], female    DOB: [birth year], 79 y.o.   MRN: [id number]  HPI   patient here to followup an interim from her last visit where she was seen in the ER with A. fib with RVR. Her leg swelling is much improved she's not taking Lasix. She was seen by her cardiologist who ordered a left scan as well as an echocardiogram but she does not have the results of this. She continues to get short of breath with minimal exertion around the home. She's wearing her oxygen at bedtime. She's not had any wheezing or coughing episodes. She is due for carotis artery scan Medications reviewed  Review of Systems   GEN- denies fatigue, fever, weight loss,weakness, recent illness HEENT- denies eye drainage, change in vision, nasal discharge, CVS- denies chest pain, palpitations RESP- + SOB, cough, wheeze ABD- denies N/V, change in stools, abd pain GU- denies dysuria, hematuria, dribbling, incontinence MSK- denies joint pain, muscle aches, injury Neuro- denies headache, dizziness, syncope, seizure activity      Objective:   Physical Exam GEN- NAD, alert and oriented x3, difficulty hearing  HEENT- PERRL, EOMI, non injected sclera, pink conjunctiva, MMM, oropharynx clear Neck- Supple, CVS- RRR, no murmur,  RESP-CTAB, no wheeze, increased WOB with walking sat 90-92%RA , but noticeably SOB, repeat exam -lungs clear no bronchospasm ABD-NABS,soft,NT,ND EXT- trace left pedal edema, venous statis changes Pulses- Radial, DP- 2+        Assessment & Plan:

## 2012-07-01 NOTE — Assessment & Plan Note (Signed)
Blood pressure is well controlled 

## 2012-07-01 NOTE — Telephone Encounter (Signed)
Ok to refill the historic one on her list?

## 2012-07-01 NOTE — Assessment & Plan Note (Signed)
She has shortness of breath with exertion however I do not see any evidence of bronchospasm causing an issue. Her oxygen sats have decreased some with exertion so I'll have her use her oxygen during the day he'll continue her on the Advair at this time. This may be a mix of both pulmonary and cardiac causing her shortness of breath as well as her age

## 2012-07-01 NOTE — Assessment & Plan Note (Signed)
This may be multifactorial per above with her cardiac history as well as her COPD. She will increase her oxygen during the daytime hallway for followup of for echocardiogram and DEXA scan. She will use Lasix when necessary for swelling continue her Advair

## 2012-07-01 NOTE — Assessment & Plan Note (Signed)
Paroxysmal A. fib currently within normal limits she's currently on atenolol for both blood pressure and rate control

## 2012-07-01 NOTE — Assessment & Plan Note (Signed)
She is due for repeat carotid Doppler she last had this in 2012 per the records. She's not on statin drugs secondary to intolerance she does take fish oil and grapeseed her LDL and 2013 was 107

## 2012-07-03 ENCOUNTER — Ambulatory Visit (HOSPITAL_COMMUNITY)
Admission: RE | Admit: 2012-07-03 | Discharge: 2012-07-03 | Disposition: A | Discharge status: Home / Self Care | Payer: Medicare Other | Source: Ambulatory Visit | Attending: Family Medicine | Admitting: Family Medicine

## 2012-07-03 DIAGNOSIS — I6529 Occlusion and stenosis of unspecified carotid artery: Secondary | ICD-10-CM | POA: Insufficient documentation

## 2012-07-04 ENCOUNTER — Telehealth: Payer: Self-pay | Admitting: Family Medicine

## 2012-07-04 NOTE — Telephone Encounter (Signed)
I spoke with patient to give her results of her carotid arteries testing. 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 His disability situation as she is clearly up in age at 79 years old and they're many risk involved with have any intervention. She's been unable to take any cholesterol medications no did not think they would benefit her anyway due to side effects. She's going to discuss with her son to see if she wants to have anything done and they will give me a call back. I did let her know that her Myoview was normal by cardiology

## 2012-07-04 NOTE — Telephone Encounter (Signed)
Spoke with patient's son I found her carotid artery duplex from 2011 which noted since 2010 she has had occlusion of the right internal carotid artery and she's had 60-70% stenosis on the left side. Basically this is unchanged on my recent carotid artery duplex at her age and there were many risk involved she does not have any new neurological deficit she's not had multiple syncopal episodes and no strokelike symptoms I would hold and not intervene he will discuss this with his mother and let me know if they actually want to have something done

## 2012-07-18 ENCOUNTER — Other Ambulatory Visit: Payer: Self-pay | Admitting: Internal Medicine

## 2012-07-19 ENCOUNTER — Telehealth: Payer: Self-pay | Admitting: Family Medicine

## 2012-07-22 ENCOUNTER — Ambulatory Visit (INDEPENDENT_AMBULATORY_CARE_PROVIDER_SITE_OTHER): Payer: Medicare Other | Admitting: Family Medicine

## 2012-07-22 ENCOUNTER — Encounter: Payer: Self-pay | Admitting: Family Medicine

## 2012-07-22 ENCOUNTER — Telehealth: Payer: Self-pay | Admitting: Internal Medicine

## 2012-07-22 VITALS — BP 136/72 | HR 98 | Resp 20 | Ht 62.0 in | Wt 129.0 lb

## 2012-07-22 DIAGNOSIS — R600 Localized edema: Secondary | ICD-10-CM | POA: Insufficient documentation

## 2012-07-22 DIAGNOSIS — R609 Edema, unspecified: Secondary | ICD-10-CM

## 2012-07-22 DIAGNOSIS — I4891 Unspecified atrial fibrillation: Secondary | ICD-10-CM

## 2012-07-22 DIAGNOSIS — R0989 Other specified symptoms and signs involving the circulatory and respiratory systems: Secondary | ICD-10-CM

## 2012-07-22 DIAGNOSIS — R0609 Other forms of dyspnea: Secondary | ICD-10-CM

## 2012-07-22 DIAGNOSIS — R0600 Dyspnea, unspecified: Secondary | ICD-10-CM

## 2012-07-22 NOTE — Telephone Encounter (Signed)
Pt's granddtr calling back re call this am from [place], pt has no idea what she is to do re the med, pls call info back to granddtr

## 2012-07-22 NOTE — Telephone Encounter (Signed)
Pt seen in office today.

## 2012-07-22 NOTE — Assessment & Plan Note (Signed)
Increasing peripheral edema however she has no volume overload in the chest. He'll have her take Lasix 20 mg daily for the next 2 days for a total of 3 days in a row. She's to take the potassium with this.

## 2012-07-22 NOTE — Assessment & Plan Note (Signed)
She has paroxysmal A. fib currently in sinus rhythm however her heart rate is quite high at 90 at rest. I advised her to take the entire tablet of atenolol 25 mg daily See instructions

## 2012-07-22 NOTE — Telephone Encounter (Signed)
Pt called our office by mistake.

## 2012-07-22 NOTE — Assessment & Plan Note (Signed)
Multifactorial, recent cardiology work-up benign for any acute changes no CHF history

## 2012-07-22 NOTE — Progress Notes (Signed)
  Subjective:    Patient ID: Abigail [name], female    DOB: January 30, 1920, 79 y.o.   MRN: [id number]  HPI  Patient presents with increased leg swelling and mild shortness of breath for the past 3 days. She states she took one Lasix pill yesterday she's only been taking a half a tablet of atenolol. She denies any chest pain or shortness of breath is mostly with exerting herself such as taking a shower or walking a good distance. She uses her oxygen as needed at home. She when she took the Lasix pill yesterday she had a good amount of output and states that her legs are ready improved  Review of Systems - per above     GEN- denies fatigue, fever, weight loss,weakness, recent illness HEENT- denies eye drainage, change in vision, nasal discharge, CVS- denies chest pain, palpitations RESP- + SOB, cough, wheeze ABD- denies N/V, change in stools, abd pain GU- denies dysuria, hematuria, dribbling, incontinence MSK- denies joint pain, muscle aches, injury Neuro- denies headache, dizziness, syncope, seizure activity      Objective:   Physical Exam  GEN- NAD, alert and oriented x3, difficulty hearing  HEENT- PERRL, EOMI, non injected sclera, pink conjunctiva, MMM, oropharynx clear Neck- Supple, CVS- mild tachycardia, no murmur,  RESP-CTAB, no wheeze,no rales , comfortable at rest ABD-NABS,soft,NT,ND EXT-2+ pedal edema, venous statis changes Pulses- Radial, DP- equal bilat      Assessment & Plan:

## 2012-07-22 NOTE — Patient Instructions (Addendum)
Take the other half of the atenolol at dinner Take 1 lasix with potassium this afternoon  Take 1 lasix Tuesday   Starting Tuesday take the entire tablet of atenolol Use your oxygen  Go the ER if you do not improve

## 2012-07-23 ENCOUNTER — Telehealth: Payer: Self-pay | Admitting: Family Medicine

## 2012-07-23 NOTE — Telephone Encounter (Signed)
Spoke with patient her legs are much improved but they're still a little swollen. She's taking her medications as prescribed I will have her take another dose of leg system are she will call in if she has persistent swelling

## 2012-07-25 ENCOUNTER — Other Ambulatory Visit: Payer: Self-pay | Admitting: *Deleted

## 2012-07-25 ENCOUNTER — Encounter: Payer: Self-pay | Admitting: *Deleted

## 2012-07-26 ENCOUNTER — Other Ambulatory Visit: Payer: Self-pay | Admitting: *Deleted

## 2012-07-31 ENCOUNTER — Other Ambulatory Visit: Payer: Self-pay | Admitting: Internal Medicine

## 2012-07-31 LAB — LIPID PANEL
Cholesterol: 163 mg/dL (ref 0–200)
Triglycerides: 58 mg/dL (ref <150)

## 2012-08-16 ENCOUNTER — Other Ambulatory Visit: Payer: Self-pay | Admitting: Family Medicine

## 2012-08-28 ENCOUNTER — Telehealth: Payer: Self-pay | Admitting: Family Medicine

## 2012-08-29 ENCOUNTER — Ambulatory Visit: Payer: Medicare Other | Admitting: Family Medicine

## 2012-09-04 NOTE — Telephone Encounter (Signed)
Patient aware that she can take furosemide daily as rx says.  She states that she has appt. 4/10

## 2012-09-05 ENCOUNTER — Ambulatory Visit (INDEPENDENT_AMBULATORY_CARE_PROVIDER_SITE_OTHER): Payer: Medicare Other | Admitting: Family Medicine

## 2012-09-05 ENCOUNTER — Encounter: Payer: Self-pay | Admitting: Family Medicine

## 2012-09-05 VITALS — BP 114/72 | HR 96 | Resp 19 | Wt 124.0 lb

## 2012-09-05 DIAGNOSIS — I10 Essential (primary) hypertension: Secondary | ICD-10-CM

## 2012-09-05 DIAGNOSIS — R609 Edema, unspecified: Secondary | ICD-10-CM

## 2012-09-05 DIAGNOSIS — R600 Localized edema: Secondary | ICD-10-CM

## 2012-09-05 DIAGNOSIS — I4891 Unspecified atrial fibrillation: Secondary | ICD-10-CM

## 2012-09-05 NOTE — Progress Notes (Signed)
  Subjective:    Patient ID: Abigail [name], female    DOB: 1919/12/11, 79 y.o.   MRN: [id number]  HPI  Pt here due to increased leg swelling and f/u blood pressure. Last visit atenolol increased to 1 tablet daily, she denies Chest pain, SOB out of her usual. Wearing oxygen as prescribed. For the past week has been taking lasix 20mg  daily,swelling has improved. She also hit her leg on side of tub and had large hematoma and bruising down left leg which is improving, she has a spot on right leg as well. Her feet are cold and turning colors since her legs started swelling, but denies pain, cramping or stiffness in legs  Review of Systems - per above  GEN- denies fatigue, fever, weight loss,weakness, recent illness HEENT- denies eye drainage, change in vision, nasal discharge, CVS- denies chest pain, palpitations RESP- denies SOB, cough, wheeze ABD- denies N/V, change in stools, abd pain Neuro- denies headache, dizziness, syncope, seizure activity      Objective:   Physical Exam GEN- NAD, alert and oriented x3 HEENT- PERRL, EOMI, non injected sclera, pink conjunctiva, MMM, oropharynx clear Neck- Supple, no JVD CVS- Irregular Rhythem, mild resting tachycardia, no murmur RESP-CTAB, normal WOB EXT- 2+ pitting edema,  Pulses- Radial, DP- decreased bilat Skin- bruising bilat legs, L >R, small hematoma Left upper shin, purplish appearance to feet bilat        Assessment & Plan:

## 2012-09-05 NOTE — Patient Instructions (Signed)
F/U Tuesday for your leg swelling Take lasix 1 tablet in Morning and 1 in evening  ( 5pm) today and tomorrow Then go to every other day  Take the potassium with each lasix

## 2012-09-05 NOTE — Assessment & Plan Note (Signed)
Increase lasix to 20mg  twice a day with potassium for the next 2 days, I will have to be careful not to dry her out and cause dizziness or hypotension Will try then to use every other day

## 2012-09-05 NOTE — Assessment & Plan Note (Signed)
Bp looks good on atenolol

## 2012-09-05 NOTE — Assessment & Plan Note (Addendum)
PAF, her rate has not changed much with 25mg  daily, she is asymptomatic at this time. doubt she needs cardioversion, her increased leg swelling is likley associated with her A fib Discussed with her cardiologist - Dr. Tenny Craw, symptomatic treatment  Will check her BMET on Tuesday Discussed with pt at home, understands to go to ER if anything changes

## 2012-09-10 ENCOUNTER — Observation Stay (HOSPITAL_COMMUNITY)
Admission: EM | Admit: 2012-09-10 | Discharge: 2012-09-12 | Disposition: A | Discharge status: Home / Self Care | Payer: Medicare Other | Attending: Family Medicine | Admitting: Family Medicine

## 2012-09-10 ENCOUNTER — Ambulatory Visit (INDEPENDENT_AMBULATORY_CARE_PROVIDER_SITE_OTHER): Payer: Medicare Other | Admitting: Family Medicine

## 2012-09-10 ENCOUNTER — Encounter: Payer: Self-pay | Admitting: Family Medicine

## 2012-09-10 ENCOUNTER — Emergency Department (HOSPITAL_COMMUNITY): Payer: Medicare Other

## 2012-09-10 ENCOUNTER — Encounter (HOSPITAL_COMMUNITY): Payer: Self-pay | Admitting: *Deleted

## 2012-09-10 VITALS — BP 120/80 | HR 82 | Resp 16 | Wt 122.0 lb

## 2012-09-10 DIAGNOSIS — I739 Peripheral vascular disease, unspecified: Secondary | ICD-10-CM

## 2012-09-10 DIAGNOSIS — E785 Hyperlipidemia, unspecified: Secondary | ICD-10-CM | POA: Diagnosis present

## 2012-09-10 DIAGNOSIS — I2510 Atherosclerotic heart disease of native coronary artery without angina pectoris: Secondary | ICD-10-CM | POA: Diagnosis present

## 2012-09-10 DIAGNOSIS — R0602 Shortness of breath: Secondary | ICD-10-CM | POA: Diagnosis present

## 2012-09-10 DIAGNOSIS — K2960 Other gastritis without bleeding: Secondary | ICD-10-CM | POA: Diagnosis present

## 2012-09-10 DIAGNOSIS — J4489 Other specified chronic obstructive pulmonary disease: Secondary | ICD-10-CM | POA: Diagnosis present

## 2012-09-10 DIAGNOSIS — I700 Atherosclerosis of aorta: Secondary | ICD-10-CM | POA: Diagnosis present

## 2012-09-10 DIAGNOSIS — I6529 Occlusion and stenosis of unspecified carotid artery: Secondary | ICD-10-CM | POA: Diagnosis present

## 2012-09-10 DIAGNOSIS — I509 Heart failure, unspecified: Secondary | ICD-10-CM | POA: Insufficient documentation

## 2012-09-10 DIAGNOSIS — J449 Chronic obstructive pulmonary disease, unspecified: Secondary | ICD-10-CM

## 2012-09-10 DIAGNOSIS — I10 Essential (primary) hypertension: Secondary | ICD-10-CM | POA: Diagnosis present

## 2012-09-10 DIAGNOSIS — I4891 Unspecified atrial fibrillation: Secondary | ICD-10-CM

## 2012-09-10 DIAGNOSIS — R609 Edema, unspecified: Secondary | ICD-10-CM

## 2012-09-10 DIAGNOSIS — I50810 Right heart failure, unspecified: Secondary | ICD-10-CM | POA: Diagnosis present

## 2012-09-10 HISTORY — DX: Essential (primary) hypertension: I10

## 2012-09-10 HISTORY — DX: Atherosclerotic heart disease of native coronary artery without angina pectoris: I25.10

## 2012-09-10 LAB — URINALYSIS, ROUTINE W REFLEX MICROSCOPIC
Bilirubin Urine: NEGATIVE
Ketones, ur: NEGATIVE mg/dL
Nitrite: NEGATIVE
Protein, ur: NEGATIVE mg/dL
Specific Gravity, Urine: 1.01 (ref 1.005–1.030)
Urobilinogen, UA: 0.2 mg/dL (ref 0.0–1.0)

## 2012-09-10 LAB — CBC WITH DIFFERENTIAL/PLATELET
Basophils Absolute: 0 10*3/uL (ref 0.0–0.1)
Eosinophils Absolute: 0.1 10*3/uL (ref 0.0–0.7)
Lymphs Abs: 1.3 10*3/uL (ref 0.7–4.0)
MCH: 34.2 pg — ABNORMAL HIGH (ref 26.0–34.0)
Neutrophils Relative %: 67 % (ref 43–77)
Platelets: 102 10*3/uL — ABNORMAL LOW (ref 150–400)
RBC: 4.3 MIL/uL (ref 3.87–5.11)
RDW: 14.5 % (ref 11.5–15.5)
WBC: 5.9 10*3/uL (ref 4.0–10.5)

## 2012-09-10 LAB — PROTIME-INR
INR: 1.13 (ref 0.00–1.49)
Prothrombin Time: 14.3 seconds (ref 11.6–15.2)

## 2012-09-10 LAB — PRO B NATRIURETIC PEPTIDE: Pro B Natriuretic peptide (BNP): 1840 pg/mL — ABNORMAL HIGH (ref 0–450)

## 2012-09-10 LAB — BASIC METABOLIC PANEL
GFR calc Af Amer: 53 mL/min — ABNORMAL LOW (ref >90)
GFR calc non Af Amer: 45 mL/min — ABNORMAL LOW (ref >90)
Glucose, Bld: 152 mg/dL — ABNORMAL HIGH (ref 70–99)
Potassium: 3.6 mEq/L (ref 3.5–5.1)
Sodium: 136 mEq/L (ref 135–145)

## 2012-09-10 MED ORDER — SODIUM CHLORIDE 0.9 % IV SOLN
INTRAVENOUS | Status: DC
  Administered 2012-09-10: 23:00:00 via INTRAVENOUS

## 2012-09-10 MED ORDER — FUROSEMIDE 20 MG PO TABS: 20.0000 mg | ORAL_TABLET | Freq: Every day | ORAL | Status: DC | PRN

## 2012-09-10 NOTE — Assessment & Plan Note (Signed)
Improved continue lasix once a day, check lytes, I think she will do better with daily lasix  Potassium supplement Compression hose as tolerated

## 2012-09-10 NOTE — Patient Instructions (Signed)
Take lasix once a day  Take with potassium with it Get the labs done today  F/U 3 months

## 2012-09-10 NOTE — ED Provider Notes (Addendum)
History  This chart was scribed for Shelda Jakes, MD by Bennett Scrape, ED Scribe. This patient was seen in room APA06/[facility]06 and the patient's care was started at 9:05 PM.  CSN: [id number]  Arrival date & time 09/10/12  2039   First MD Initiated Contact with Patient 09/10/12 2105      Chief Complaint  Patient presents with  . Shortness of Breath     Patient is a 79 y.o. female presenting with shortness of breath. The history is provided by the patient. No language interpreter was used.  Shortness of Breath Severity:  Mild Onset quality:  Gradual Timing:  Constant Progression:  Waxing and waning Chronicity:  Chronic Relieved by:  Oxygen and rest Worsened by:  Exertion Associated symptoms: no abdominal pain, no chest pain, no cough, no fever, no headaches, no neck pain, no rash, no sore throat and no vomiting     Abigail [name] is a 79 y.o. female with a h/o cardiac stent, A. Fib and SOB who presents to the Emergency Department complaining of worsening of recurrent palpitations with associated worsening of chronic SOB. Pt states that this is an ongoing issue but it became worse 2 weeks ago. She admits that she feels at her baseline currently. She was seen by her PCP today at 10 AM for the SOB, leg swelling and palpitations. She denies feeling the palpitations during the visit but felt at her baseline SOB. She was seen in the ED for the same and has been visiting her PCP frequently since then. She was told to come to the ED if she started feeling chest tightness due to A. Fib noted upon exam taken. Pt is unable to remember what her HR was at that time. Son states that the pt called him to bring her to the ED and states that the pt had used a breathing treatment prior to his arrival. She is also currently on O2 at home. He reports that her HR has reached 120 while waiting in the ED on the cardiac monitor. Normal resting HR is unknown. She denies abdominal pain, CP, nausea, emesis and  diarrhea as associated symptoms. Pt states that she is taking atenolol, lasix (started during last appointment 2 weeks ago) and 81 mg ASA daily.  PCP is Dr. Jeanice Lim Dr. Tenny Craw is Cardiologist with [facility]  Pt lives by herself  Past Medical History  Diagnosis Date  . Asthma   . Heart disease   . Colon cancer   . CAD (coronary artery disease)     s/p PTCA RCA (Dr. Juliann Pares)  . PVD (peripheral vascular disease)   . Atrial fibrillation   . Hypertension   . [place]   . COPD (chronic obstructive pulmonary disease) 2008    emphysemaous with asthmatic component-FEV 2008 34%  . Rectal bleeding   . SBO (small bowel obstruction)   . MRSA (methicillin resistant Staphylococcus aureus)   . Nontoxic multinodular goiter   . History of esophagitis     gastritis, duodenitis.  EGD neg 10/20/02  . Tachycardia   . Hypercholesterolemia   . Arteriovenous malformation of duodenum   . tobacco abuse, history of   . atherosclerosis   . Leg cramps     with mild claudication  . Sinus tachycardia   . Hip fracture     right, pins 2009  . Diverticulosis of colon   . Hx of colonic polyps   . Gall stones 10/1997    abd [place]  . Goiter  thyroid US- bx 06/26/03  . Femoral neck fracture     ORIF, Dr. Yisroel Ramming 02/20/08  . Compression fracture of vertebra     T4/ kyphoplasty, hosp 12/31-06/02/10  . Colon cancer     Colonoscopy wnl 10/21/02  . Carcinoma of colon     s/p resection  . Skin cancer     L forearm 2012, per derm    Past Surgical History  Procedure Laterality Date  . Abdominal hysterectomy    . Hip fracture surgery    . Colon resection  05/1997    cancer and carcinoid tumor  . Ileostomy  05/1997    takedown 7/99  . Cardiac catheterization      stent/ ER, ok LE's 10/00. PV airtigraogy- stent 10/00  . Angioplasty  7/00    R CA  . Abdominal surgery      incarcerated ventral hernia, MRSA 06/28/00  . Cataract extraction  2004    bilateral  . Total abdominal hysterectomy      Family History   Problem Relation Age of Onset  . Kidney disease Mother     died of kidney failure  . Cancer Father     stomach    History  Substance Use Topics  . Smoking status: Former Games developer  . Smokeless tobacco: Never Used     Comment: quit 10-11 years ago  . Alcohol Use: No    No OB history provided.  Review of Systems  Constitutional: Negative for fever and chills.  HENT: Positive for congestion (allergies). Negative for sore throat and neck pain.   Eyes: Positive for visual disturbance (ongoing, lasix side effect per pt).  Respiratory: Negative for cough and shortness of breath.   Cardiovascular: Positive for palpitations and leg swelling (chronic but worse). Negative for chest pain.  Gastrointestinal: Negative for nausea, vomiting, abdominal pain and diarrhea.  Genitourinary: Negative for dysuria.  Musculoskeletal: Negative for back pain.  Skin: Negative for rash.  Allergic/Immunologic: Positive for environmental allergies.  Neurological: Negative for headaches.  Hematological: Does not bruise/bleed easily.  Psychiatric/Behavioral: Negative for confusion.    Allergies  Budesonide-formoterol fumarate; Diltiazem; Metoprolol; Sulfa antibiotics; and Sulfonamide derivatives  Home Medications   Current Outpatient Rx  Name  Route  Sig  Dispense  Refill  . acetaminophen (TYLENOL) 500 MG tablet   Oral   Take 500 mg by mouth at bedtime as needed for pain.          Marland Kitchen aspirin 81 MG tablet   Oral   Take 81 mg by mouth every morning.          Marland Kitchen atenolol (TENORMIN) 25 MG tablet   Oral   Take 25 mg by mouth every morning.         . Calcium 1500 MG tablet   Oral   Take 1,500 mg by mouth every morning.          . cholecalciferol (VITAMIN D) 1000 UNITS tablet   Oral   Take 1,000 Units by mouth daily.           . Cyanocobalamin (VITAMIN B-12) 500 MCG LOZG   Oral   Take 500 mcg by mouth daily.          . Fluticasone-Salmeterol (ADVAIR) 250-50 MCG/DOSE AEPB    Inhalation   Inhale 1 puff into the lungs every 12 (twelve) hours.         . furosemide (LASIX) 20 MG tablet   Oral   Take 20 mg by mouth daily.         Marland Kitchen  GRAPE SEED PO   Oral   Take 1 capsule by mouth 2 (two) times daily. Take 2 by mouth daily         . Multiple Vitamins-Minerals (CENTRUM SILVER PO)   Oral   Take 1 tablet by mouth daily.          . Omega-3 Fatty Acids (FISH OIL) 1000 MG CAPS   Oral   Take 1 capsule by mouth 2 (two) times daily.          Marland Kitchen omeprazole (PRILOSEC) 20 MG capsule   Oral   Take 20 mg by mouth every morning.          . potassium chloride (K-DUR,KLOR-CON) 10 MEQ tablet   Oral   Take 1 tablet (10 mEq total) by mouth daily.   30 tablet   3   . nitroGLYCERIN (NITROSTAT) 0.4 MG SL tablet      Place one under tongue every 5 minutes x 3 as needed   25 tablet   1     Triage Vitals: BP 132/76  Pulse 107  Temp(Src) 98.6 F (37 C) (Oral)  Resp 20  SpO2 95%  Physical Exam  Nursing note and vitals reviewed. Constitutional: She is oriented to person, place, and time. She appears well-developed and well-nourished. No distress.  HENT:  Head: Normocephalic and atraumatic.  Mouth/Throat: Oropharynx is clear and moist.  Eyes: Conjunctivae and EOM are normal. Pupils are equal, round, and reactive to light.  Neck: Neck supple. No tracheal deviation present.  Cardiovascular: An irregular rhythm present. Tachycardia present.   No murmur heard. HR 101  Pulmonary/Chest: Effort normal and breath sounds normal. No respiratory distress. She has no wheezes. She has no rales.  Abdominal: Soft. Bowel sounds are normal. She exhibits no distension. There is no tenderness.  Musculoskeletal: Normal range of motion. She exhibits edema (mild ankle swelling).  Lymphadenopathy:    She has no cervical adenopathy.  Neurological: She is alert and oriented to person, place, and time. No cranial nerve deficit.  Pt able to move both sets of fingers and toes   Skin: Skin is warm and dry. No rash noted.  Psychiatric: She has a normal mood and affect. Her behavior is normal.    ED Course  Procedures (including critical care time)  DIAGNOSTIC STUDIES: Oxygen Saturation is 95% on room air, adequate by my interpretation.    COORDINATION OF CARE: 9:56 PM-Discussed treatment plan which includes cardiac monitor observation, CXR, CBC panel, CMP, UA with pt at bedside and pt agreed to plan.   Labs Reviewed  CBC WITH DIFFERENTIAL - Abnormal; Notable for the following:    MCH 34.2 (*)    Platelets 102 (*)    All other components within normal limits  BASIC METABOLIC PANEL - Abnormal; Notable for the following:    Glucose, Bld 152 (*)    BUN 24 (*)    GFR calc non Af Amer 45 (*)    GFR calc Af Amer 53 (*)    All other components within normal limits  PRO B NATRIURETIC PEPTIDE - Abnormal; Notable for the following:    Pro B Natriuretic peptide (BNP) 1840.0 (*)    All other components within normal limits  URINALYSIS, ROUTINE W REFLEX MICROSCOPIC - Abnormal; Notable for the following:    Hgb urine dipstick TRACE (*)    All other components within normal limits  PROTIME-INR  TROPONIN I  URINE MICROSCOPIC-ADD ON   Dg Chest 2 View  09/10/2012  *  RADIOLOGY REPORT*  Clinical Data: Weakness, shortness of breath and irregular heart rate.  Bilateral lower extremity swelling.  CHEST - 2 VIEW  Comparison: Chest radiograph performed 05/18/2012  Findings: The lungs are hyperexpanded, with flattening of the hemidiaphragms, compatible with COPD.  Chronic bilateral scarring is seen; underlying vascular congestion is noted.  No definite pleural effusion or pneumothorax identified.  The heart is borderline normal in size; diffuse calcification is noted along the thoracic and upper abdominal aorta.  No acute osseous abnormalities are seen.  Clips are noted within the right upper quadrant, reflecting prior cholecystectomy.  The patient is status post vertebroplasty at  the upper thoracic spine.  IMPRESSION: Findings of COPD, with chronic bilateral scarring, but no definite evidence for acute superimposed focal airspace consolidation.   Original Report Authenticated By: Tonia Ghent, M.D.    Results for orders placed during the hospital encounter of 09/10/12  CBC WITH DIFFERENTIAL      Result Value Range   WBC 5.9  4.0 - 10.5 K/uL   RBC 4.30  3.87 - 5.11 MIL/uL   Hemoglobin 14.7  12.0 - 15.0 g/dL   HCT 40.9  81.1 - 91.4 %   MCV 99.5  78.0 - 100.0 fL   MCH 34.2 (*) 26.0 - 34.0 pg   MCHC 34.3  30.0 - 36.0 g/dL   RDW 78.2  95.6 - 21.3 %   Platelets 102 (*) 150 - 400 K/uL   Neutrophils Relative 67  43 - 77 %   Neutro Abs 3.9  1.7 - 7.7 K/uL   Lymphocytes Relative 22  12 - 46 %   Lymphs Abs 1.3  0.7 - 4.0 K/uL   Monocytes Relative 9  3 - 12 %   Monocytes Absolute 0.5  0.1 - 1.0 K/uL   Eosinophils Relative 2  0 - 5 %   Eosinophils Absolute 0.1  0.0 - 0.7 K/uL   Basophils Relative 0  0 - 1 %   Basophils Absolute 0.0  0.0 - 0.1 K/uL  BASIC METABOLIC PANEL      Result Value Range   Sodium 136  135 - 145 mEq/L   Potassium 3.6  3.5 - 5.1 mEq/L   Chloride 98  96 - 112 mEq/L   CO2 26  19 - 32 mEq/L   Glucose, Bld 152 (*) 70 - 99 mg/dL   BUN 24 (*) 6 - 23 mg/dL   Creatinine, Ser 0.86  0.50 - 1.10 mg/dL   Calcium 9.4  8.4 - 57.8 mg/dL   GFR calc non Af Amer 45 (*) >90 mL/min   GFR calc Af Amer 53 (*) >90 mL/min  PROTIME-INR      Result Value Range   Prothrombin Time 14.3  11.6 - 15.2 seconds   INR 1.13  0.00 - 1.49  PRO B NATRIURETIC PEPTIDE      Result Value Range   Pro B Natriuretic peptide (BNP) 1840.0 (*) 0 - 450 pg/mL  URINALYSIS, ROUTINE W REFLEX MICROSCOPIC      Result Value Range   Color, Urine YELLOW  YELLOW   APPearance CLEAR  CLEAR   Specific Gravity, Urine 1.010  1.005 - 1.030   pH 6.0  5.0 - 8.0   Glucose, UA NEGATIVE  NEGATIVE mg/dL   Hgb urine dipstick TRACE (*) NEGATIVE   Bilirubin Urine NEGATIVE  NEGATIVE   Ketones, ur NEGATIVE   NEGATIVE mg/dL   Protein, ur NEGATIVE  NEGATIVE mg/dL   Urobilinogen, UA 0.2  0.0 -  1.0 mg/dL   Nitrite NEGATIVE  NEGATIVE   Leukocytes, UA NEGATIVE  NEGATIVE  TROPONIN I      Result Value Range   Troponin I <0.30  <0.30 ng/mL  URINE MICROSCOPIC-ADD ON      Result Value Range   RBC / HPF 0-2  <3 RBC/hpf    Date: 09/11/2012  Rate: 116  Rhythm: atrial fibrillation  QRS Axis: normal  Intervals: normal  ST/T Wave abnormalities: nonspecific ST/T changes  Conduction Disutrbances:right bundle branch block  Narrative Interpretation:   Old EKG Reviewed: unchanged Correction left axis. No sniffing change in EKG compared to 05/18/2012.   1. Atrial fibrillation with rapid ventricular response   2. Shortness of breath       MDM  Patient with known history of atrial for relation. Here with a resting heart rate upper 90s up to 122 some shortness of breath increased BNP but no florid pulmonary edema on chest x-Pizzuto. Troponin negative EKG is consistent with atrial fibrillation heart rate was 116 on the EKG has widened QRS complex but that is unchanged from December 2013. Discussed with the hospitalist she has seen her primary care doctor now twice once earlier today and then 2 weeks ago in general she's been having some shortness of breath issues and some palpitations and borderline heart rates. We both agree the best that she be admitted to telemetry monitored with cardiology consult. She  has been on Tenormin for this may not be the best medication in consultation they can decide what to do.  No  acute cardiac event.       I personally performed the services described in this documentation, which was scribed in my presence. The recorded information has been reviewed and is accurate.     Shelda Jakes, MD 09/11/12 [id number]  Shelda Jakes, MD 09/11/12 [phone]

## 2012-09-10 NOTE — Assessment & Plan Note (Signed)
Noted on exam diminished pulses, change in skin color with the swelling, I see no benefit in intervening, discussed this with her and family Able to ambulate, no pain, doing well otherwise

## 2012-09-10 NOTE — ED Notes (Signed)
C/o shortness of breath and heart fluttering

## 2012-09-10 NOTE — Assessment & Plan Note (Addendum)
She is still in A fib but not symptomatic, HR improved today, continue atenolol Oxygen to be used day and night I discussed with her cardiologist, no further changes to meds Spoke with her son and Okey Dupre who resides over her facility, any changes to take her to ER

## 2012-09-10 NOTE — Progress Notes (Signed)
  Subjective:    Patient ID: Abigail [name], female    DOB: [birth year], 79 y.o.   MRN: [id number]  HPI  Patient here for interim visit she was seen one week ago secondary to increased leg swelling was found to be in A. fib however asymptomatic. She was started on Lasix twice a day dosing and then will record try every other day however the swelling worsens when she does not take the Lasix. She has no new complaints today.   Review of Systems  GEN- denies fatigue, fever, weight loss,weakness, recent illness HEENT- denies eye drainage, change in vision, nasal discharge, CVS- denies chest pain, palpitations RESP- denies SOB, cough, wheeze ABD- denies N/V, change in stools, abd pain Neuro- denies headache, dizziness, syncope, seizure activity      Objective:   Physical Exam GEN- NAD, alert and oriented x3 HEENT- PERRL, EOMI, non injected sclera, pink conjunctiva, MMM, oropharynx clear Neck- Supple, no JVD CVS- Irregular Rhythm, normal rate, no murmur RESP-CTAB, normal WOB EXT- 1+ pitting edema,  Pulses- Radial, DP- decreased bilat Skin- bruising bilat legs, , purplish appearance to feet bilat        Assessment & Plan:

## 2012-09-11 ENCOUNTER — Encounter (HOSPITAL_COMMUNITY): Payer: Self-pay | Admitting: Cardiology

## 2012-09-11 ENCOUNTER — Telehealth: Payer: Self-pay | Admitting: Family Medicine

## 2012-09-11 DIAGNOSIS — I2510 Atherosclerotic heart disease of native coronary artery without angina pectoris: Secondary | ICD-10-CM

## 2012-09-11 DIAGNOSIS — I4891 Unspecified atrial fibrillation: Secondary | ICD-10-CM

## 2012-09-11 DIAGNOSIS — I50810 Right heart failure, unspecified: Secondary | ICD-10-CM | POA: Diagnosis present

## 2012-09-11 DIAGNOSIS — I509 Heart failure, unspecified: Secondary | ICD-10-CM

## 2012-09-11 DIAGNOSIS — R0602 Shortness of breath: Secondary | ICD-10-CM | POA: Diagnosis present

## 2012-09-11 LAB — BASIC METABOLIC PANEL
BUN: 24 mg/dL — ABNORMAL HIGH (ref 6–23)
CO2: 34 mEq/L — ABNORMAL HIGH (ref 19–32)
Chloride: 99 mEq/L (ref 96–112)
Creat: 1.09 mg/dL (ref 0.50–1.10)
Glucose, Bld: 90 mg/dL (ref 70–99)

## 2012-09-11 LAB — TROPONIN I: Troponin I: <0.3 ng/mL (ref <0.30)

## 2012-09-11 MED ORDER — FLUTICASONE PROPIONATE HFA 44 MCG/ACT IN AERO
2.0000 | INHALATION_SPRAY | Freq: Two times a day (BID) | RESPIRATORY_TRACT | Status: DC
  Administered 2012-09-11 – 2012-09-12 (×3): 2 via RESPIRATORY_TRACT
  Filled 2012-09-11: qty 10.6

## 2012-09-11 MED ORDER — ARFORMOTEROL TARTRATE 15 MCG/2ML IN NEBU: 15.0000 ug | INHALATION_SOLUTION | Freq: Two times a day (BID) | RESPIRATORY_TRACT | Status: DC

## 2012-09-11 MED ORDER — SODIUM CHLORIDE 0.9 % IJ SOLN
3.0000 mL | Freq: Two times a day (BID) | INTRAMUSCULAR | Status: DC
  Administered 2012-09-11 (×2): 3 mL via INTRAVENOUS

## 2012-09-11 MED ORDER — FUROSEMIDE 10 MG/ML IJ SOLN
40.0000 mg | Freq: Once | INTRAMUSCULAR | Status: DC
  Filled 2012-09-11: qty 4

## 2012-09-11 MED ORDER — SODIUM CHLORIDE 0.9 % IJ SOLN: 3.0000 mL | INTRAMUSCULAR | Status: DC | PRN

## 2012-09-11 MED ORDER — METOPROLOL TARTRATE 1 MG/ML IV SOLN
2.5000 mg | Freq: Once | INTRAVENOUS | Status: AC
  Administered 2012-09-11: 2.5 mg via INTRAVENOUS
  Filled 2012-09-11: qty 5

## 2012-09-11 MED ORDER — FUROSEMIDE 10 MG/ML IJ SOLN
40.0000 mg | Freq: Once | INTRAMUSCULAR | Status: AC
  Administered 2012-09-11: 40 mg via INTRAVENOUS

## 2012-09-11 MED ORDER — ACETAMINOPHEN 650 MG RE SUPP: 650.0000 mg | Freq: Four times a day (QID) | RECTAL | Status: DC | PRN

## 2012-09-11 MED ORDER — POTASSIUM CHLORIDE CRYS ER 10 MEQ PO TBCR
10.0000 meq | EXTENDED_RELEASE_TABLET | Freq: Every day | ORAL | Status: DC
  Administered 2012-09-11 – 2012-09-12 (×2): 10 meq via ORAL
  Filled 2012-09-11 (×2): qty 1

## 2012-09-11 MED ORDER — ONDANSETRON HCL 4 MG/2ML IJ SOLN: 4.0000 mg | Freq: Four times a day (QID) | INTRAMUSCULAR | Status: DC | PRN

## 2012-09-11 MED ORDER — ONDANSETRON HCL 4 MG PO TABS: 4.0000 mg | ORAL_TABLET | Freq: Four times a day (QID) | ORAL | Status: DC | PRN

## 2012-09-11 MED ORDER — ACETAMINOPHEN 325 MG PO TABS: 650.0000 mg | ORAL_TABLET | Freq: Four times a day (QID) | ORAL | Status: DC | PRN

## 2012-09-11 MED ORDER — SODIUM CHLORIDE 0.9 % IV SOLN: 250.0000 mL | INTRAVENOUS | Status: DC | PRN

## 2012-09-11 MED ORDER — TIOTROPIUM BROMIDE MONOHYDRATE 18 MCG IN CAPS
18.0000 ug | ORAL_CAPSULE | Freq: Every day | RESPIRATORY_TRACT | Status: DC
  Administered 2012-09-11 – 2012-09-12 (×2): 18 ug via RESPIRATORY_TRACT
  Filled 2012-09-11: qty 5

## 2012-09-11 MED ORDER — FUROSEMIDE 10 MG/ML IJ SOLN
40.0000 mg | Freq: Two times a day (BID) | INTRAMUSCULAR | Status: DC
  Administered 2012-09-11: 40 mg via INTRAVENOUS
  Filled 2012-09-11: qty 4

## 2012-09-11 MED ORDER — PANTOPRAZOLE SODIUM 40 MG PO TBEC
40.0000 mg | DELAYED_RELEASE_TABLET | Freq: Every day | ORAL | Status: DC
  Administered 2012-09-11 – 2012-09-12 (×2): 40 mg via ORAL
  Filled 2012-09-11 (×2): qty 1

## 2012-09-11 MED ORDER — FUROSEMIDE 20 MG PO TABS
20.0000 mg | ORAL_TABLET | Freq: Every day | ORAL | Status: DC
  Administered 2012-09-12: 20 mg via ORAL
  Filled 2012-09-11: qty 1

## 2012-09-11 MED ORDER — ENOXAPARIN SODIUM 30 MG/0.3ML ~~LOC~~ SOLN
30.0000 mg | SUBCUTANEOUS | Status: DC
  Administered 2012-09-11 – 2012-09-12 (×2): 30 mg via SUBCUTANEOUS
  Filled 2012-09-11 (×3): qty 0.3

## 2012-09-11 MED ORDER — DILTIAZEM HCL 30 MG PO TABS
30.0000 mg | ORAL_TABLET | Freq: Two times a day (BID) | ORAL | Status: DC
  Administered 2012-09-11 – 2012-09-12 (×3): 30 mg via ORAL
  Filled 2012-09-11 (×3): qty 1

## 2012-09-11 MED ORDER — ATENOLOL 25 MG PO TABS
25.0000 mg | ORAL_TABLET | Freq: Every day | ORAL | Status: DC
  Administered 2012-09-11 – 2012-09-12 (×2): 25 mg via ORAL
  Filled 2012-09-11 (×2): qty 1

## 2012-09-11 MED ORDER — POLYETHYLENE GLYCOL 3350 17 G PO PACK: 17.0000 g | PACK | Freq: Every day | ORAL | Status: DC | PRN

## 2012-09-11 MED ORDER — ARFORMOTEROL TARTRATE 15 MCG/2ML IN NEBU
15.0000 ug | INHALATION_SOLUTION | Freq: Two times a day (BID) | RESPIRATORY_TRACT | Status: DC
  Administered 2012-09-11 – 2012-09-12 (×3): 15 ug via RESPIRATORY_TRACT
  Filled 2012-09-11 (×5): qty 2

## 2012-09-11 MED ORDER — ASPIRIN EC 81 MG PO TBEC
81.0000 mg | DELAYED_RELEASE_TABLET | Freq: Every morning | ORAL | Status: DC
Start: 2012-09-11 — End: 2012-09-12
  Administered 2012-09-11 – 2012-09-12 (×2): 81 mg via ORAL
  Filled 2012-09-11 (×2): qty 1

## 2012-09-11 MED ORDER — ALUM & MAG HYDROXIDE-SIMETH 200-200-20 MG/5ML PO SUSP: 30.0000 mL | Freq: Four times a day (QID) | ORAL | Status: DC | PRN

## 2012-09-11 MED ORDER — SODIUM CHLORIDE 0.9 % IJ SOLN
3.0000 mL | Freq: Two times a day (BID) | INTRAMUSCULAR | Status: DC
  Administered 2012-09-11 – 2012-09-12 (×4): 3 mL via INTRAVENOUS

## 2012-09-11 NOTE — Consult Note (Signed)
CARDIOLOGY CONSULT NOTE  Patient ID: GISSELE NARDUCCI MRN: [id number] DOB/AGE: 10/13/1919 79 y.o.  Admit date: 09/10/2012 Referring Physician: PTH-Goodrich MD Primary Physician Milinda Antis, MD Primary Cardiologist: Dietrich Pates MD Reason for Consultation: Atrial fibrillation with RVR  HPI: Mrs. Cotten is a 79 year old female patient of Dr. Dietrich Pates who was admitted with rapid atrial fibrillation, dyspnea, with elevated proBNP (1,840) without evidence of CHF on chest x-Wegener. She has a history of COPD with oxygen dependence, paroxysmal atrial fibrillation, CAD, PVD, and cor pulmonale. Cardiac troponins were found to be negative x2. The patient was provided with IV Lasix 40 mg x1. Patient states she urinated quite a bit overnight, and is feeling better.. We are asked for cardiology recommendations and management during hospitalization.      Symptoms actually began about a month ago when she was having some lower extremity edema. She saw her primary care physician, Dr. Jeanice Lim, who increased her Lasix 20 mg to a daily dose for several days (instead of QOD), with good response. However over the last week the she experienced  increasing lower extremity edema, dyspnea, and had occasional fluttering in her chest. She is unable to tell when her heart rate is elevated or irregular, and is unaware of how long atrial fibrillation has been persistent as opposed to paroxysmal.  Most recent echocardiogram was completed in January of 2014 revealing an LVEF of 55-60%. Left atrium was moderately dilated, right ventricle size was mildly dilated, aortic valve is mildly calcified, AoV area 1.08 cm square by VTI, and 1.11 cm square by Vmax. The nuclear stress test in January of 2014 as well revealing no evidence of ischemia with prominent physiologic apical thinning and diaphragmatic attenuation.  Review of systems complete and found to be negative unless listed above   Past Medical History  Diagnosis Date  .  Asthma   . Coronary atherosclerosis of native coronary artery     s/p PTCA RCA (Dr. Juliann Pares, [place])  . PVD (peripheral vascular disease)   . Atrial fibrillation   . Essential hypertension, benign   . Amenia   . COPD (chronic obstructive pulmonary disease) 2008    Emphysemaous with asthmatic component-FEV 2008 34%  . Rectal bleeding   . SBO (small bowel obstruction)   . MRSA (methicillin resistant Staphylococcus aureus)   . Nontoxic multinodular goiter   . History of esophagitis     Gastritis, duodenitis. EGD neg 10/20/02  . Hypercholesterolemia   . Arteriovenous malformation of duodenum   . Leg cramps     With mild claudication  . Diverticulosis of colon   . Hx of colonic polyps   . Gall stones 10/1997  . Goiter     thyroid US- bx 06/26/03  . Femoral neck fracture     ORIF, Dr. Yisroel Ramming 02/20/08  . Compression fracture of vertebra     T4/ kyphoplasty, hosp 12/31-06/02/10  . Carcinoma of colon     s/p resection  . Skin cancer     L forearm 2012, per derm    Family History  Problem Relation Age of Onset  . Kidney disease Mother     Died of kidney failure  . Cancer Father     Stomach      Past Surgical History  Procedure Laterality Date  . Abdominal hysterectomy    . Hip fracture surgery    . Colon resection  05/1997    cancer and carcinoid tumor  . Ileostomy  05/1997    takedown 7/99  .  Cardiac catheterization      stent/ ER, ok LE's 10/00. PV airtigraogy- stent 10/00  . Angioplasty  7/00    R CA  . Abdominal surgery      incarcerated ventral hernia, MRSA 06/28/00  . Cataract extraction  2004    bilateral  . Total abdominal hysterectomy       Prescriptions prior to admission  Medication Sig Dispense Refill  . acetaminophen (TYLENOL) 500 MG tablet Take 500 mg by mouth at bedtime as needed for pain.       Marland Kitchen aspirin 81 MG tablet Take 81 mg by mouth every morning.       . Calcium 1500 MG tablet Take 1,500 mg by mouth every morning.       . cholecalciferol  (VITAMIN D) 1000 UNITS tablet Take 1,000 Units by mouth daily.        . Cyanocobalamin (VITAMIN B-12) 500 MCG LOZG Take 500 mcg by mouth daily.       . Fluticasone-Salmeterol (ADVAIR) 250-50 MCG/DOSE AEPB Inhale 1 puff into the lungs every 12 (twelve) hours.      . furosemide (LASIX) 20 MG tablet Take 20 mg by mouth daily.      Marland Kitchen GRAPE SEED PO Take 1 capsule by mouth 2 (two) times daily. Take 2 by mouth daily      . Multiple Vitamins-Minerals (CENTRUM SILVER PO) Take 1 tablet by mouth daily.       . Omega-3 Fatty Acids (FISH OIL) 1000 MG CAPS Take 1 capsule by mouth 2 (two) times daily.       Marland Kitchen omeprazole (PRILOSEC) 20 MG capsule Take 20 mg by mouth every morning.       . potassium chloride (K-DUR,KLOR-CON) 10 MEQ tablet Take 1 tablet (10 mEq total) by mouth daily.  30 tablet  3  . [DISCONTINUED] atenolol (TENORMIN) 25 MG tablet Take 25 mg by mouth every morning.      . nitroGLYCERIN (NITROSTAT) 0.4 MG SL tablet Place one under tongue every 5 minutes x 3 as needed  25 tablet  1    Physical Exam: Blood pressure 104/69, pulse 104, temperature 98 F (36.7 C), temperature source Oral, resp. rate 20, height 5\' 4"  (1.626 m), weight 123 lb 7.3 oz (56 kg), SpO2 87.00%.  General: No acute distress Head: Eyes PERRLA, No xanthomas.  Normal cephalic and atramatic  Lungs: Clear bilaterally to auscultation and percussion. Heart: HRIR S1 S2,tachycardic with 1-2/6 systolic murmur.  Pulses are 2+ Abdomen: Bowel sounds are positive, abdomen soft and non-tender without masses or                  Hernia's noted. Msk:  Back normal,  Normal strength and tone for age. Extremities: No edema.  Unable to palpate DP or PT pulses. Neuro: Alert and oriented X 3. Psych:  Good affect, responds appropriately  Labs:   Lab Results  Component Value Date   WBC 5.9 09/10/2012   HGB 14.7 09/10/2012   HCT 42.8 09/10/2012   MCV 99.5 09/10/2012   PLT 102* 09/10/2012    Recent Labs Lab 09/10/12 2234  NA 136  K 3.6  CL  98  CO2 26  BUN 24*  CREATININE 1.05  CALCIUM 9.4  GLUCOSE 152*   BNP (last 3 results)  Recent Labs  05/18/12 0820 09/10/12 2234  PROBNP 2828.0* 1840.0*    Radiology: Dg Chest 2 View  09/10/2012  *RADIOLOGY REPORT*  Clinical Data: Weakness, shortness of breath and irregular heart  rate.  Bilateral lower extremity swelling.  CHEST - 2 VIEW  Comparison: Chest radiograph performed 05/18/2012  Findings: The lungs are hyperexpanded, with flattening of the hemidiaphragms, compatible with COPD.  Chronic bilateral scarring is seen; underlying vascular congestion is noted.  No definite pleural effusion or pneumothorax identified.  The heart is borderline normal in size; diffuse calcification is noted along the thoracic and upper abdominal aorta.  No acute osseous abnormalities are seen.  Clips are noted within the right upper quadrant, reflecting prior cholecystectomy.  The patient is status post vertebroplasty at the upper thoracic spine.  IMPRESSION: Findings of COPD, with chronic bilateral scarring, but no definite evidence for acute superimposed focal airspace consolidation.   Original Report Authenticated By: Tonia Ghent, M.D.    EKG: Atrial fibrillation with rapid rate of 116  beats per minute, with right bundle branch block.  ASSESSMENT AND PLAN:   1. Atrial fibrillation: Persistent with rapid ventricular response. Duration is uncertain with history of PAF, although last office visit with Dr. Tenny Craw in January also documented atrial fibrillation. The patient has been placed on diltiazem 30 mg every 12 hours. Home dose of atenolol was 25 mg daily. She has had no major wheezing despite history of COPD on beta blocker. Recent heart rate 90's to low 100's and blood pressure is low normal at 104/69. The patient's CHADS2 score is 2 (Hypertension and CHF). .She has not been anticoagulated by Dr. Tenny Craw, has been on aspirin at home. She lives alone, but does have good family support.  2. CAD: Recent  pharmacological stress test was negative for ischemia in January of 2014. Echocardiogram revealed normal LV function, with only moderately dilated right atrium. Continue aspirin 81 mg daily.  3. Right Heart Failure: Elevated pro BNP on admission with lower extremity edema. She appears compensated at this time after one dose of IV Lasix 40 mg. She is then returned to by mouth Lasix 20 mg daily. Her weight has returned to baseline. Elevated heart rate, contributed to CHF.  4. COPD: Breathing status is at baseline, continues on O2  Signed: Bettey Mare. Lyman Bishop NP [facility] 09/11/2012, 1:35 PM Co-Sign MD   Attending note:  Patient seen and examined. Reviewed records and modified above note by Ms. Lawrence NP. Patient last saw Dr. Tenny Craw earlier this year. Has had recent problems with progressive leg edema, intermittently elevated heart rates in atrial fibrillation. Not entirely clear how long she has had persistent atrial fibrillation, although was noted to be present by ECG in January as well. She is feeling better after diuresis, reports her legs have improved significantly. Heart rate is still not optimally controlled.  Her current regimen was reviewed, includes Cardizem 30 mg every 12 hours but no other medicines for rate control. Previous note from Dr. Tenny Craw indicates that patient was on atenolol 25 mg daily, I do not see this listed in her recent H&P, and she is now on it now. Will initiate atenolol 25 mg daily, give first dose today. She may be able to come off of the Cardizem that was added, and perhaps consider increasing the atenolol dose if needed. Follow heart rate control on telemetry. Would have her ambulate in the hall as well. Our service will follow with you.  Jonelle Sidle, M.D., F.A.C.C.

## 2012-09-11 NOTE — Telephone Encounter (Signed)
Spoke with Dr. Irene Limbo to give him an update on past months events with Abigail [name] and her medications. [name] if needed Seems to be responding well to lasix

## 2012-09-11 NOTE — ED Notes (Signed)
Pt wants grandson's called for any updates, Abigail [name] (closest) [phone], and Abigail [name] [phone].

## 2012-09-11 NOTE — Care Management Note (Addendum)
    Page 1 of 1   09/12/2012     1:37:25 PM   CARE MANAGEMENT NOTE 09/12/2012  Patient:  Abigail [name], Abigail [name]   Account Number:  [id number]  Date Initiated:  09/11/2012  Documentation initiated by:  Rosemary Holms  Subjective/Objective Assessment:   [place] in a [place] app[facility] complex. Her son and g'son assist her as needed. Pt has O2 at home but it is not continuous. States she gets her O2 from [facility]. Agreed to [facility] referral     Action/Plan:   Anticipated DC Date:  09/12/2012   Anticipated DC Plan:  HOME/SELF CARE      DC Planning Services  CM consult      Choice offered to / List presented to:             Status of service:  Completed, signed off Medicare Important Message given?  NA - LOS <3 / Initial given by admissions (If response is "NO", the following Medicare IM given date fields will be blank) Date Medicare IM given:   Date Additional Medicare IM given:    Discharge Disposition:  HOME/SELF CARE  Per UR Regulation:    If discussed at Long Length of Stay Meetings, dates discussed:    Comments:  09/12/12 Rosemary Holms RN BSN CM Pt did not qualify for home O2. No needs identified  09/11/12 Rosemary Holms RN BSN CM Pt's O2 order for continuous O2 needs to be faxed to [facility] in [place] at [phone] if ordered. Pt met with Anibal Henderson from [facility] and agreed to services. Pt given scales based on Tim's recommendation.

## 2012-09-11 NOTE — Progress Notes (Signed)
[facility] PROGRESS NOTE  TYNESHA FREE ZOX:[id number] DOB: [birth year] DOA: 09/10/2012 PCP: Milinda Antis, MD  Assessment/Plan: CHF with right heart failure: related to COPD.  Breathing improved this am. Will transition IV lasix to po. Echo 1/14 yields  EF 55-60% no regional wall motion abnormality. Wt stable. [facility] care manager to see pt.  Active Problems:   Afib: rate currently 106-116. Will start diltiazem as recommended Dr. Phillips Odor on admission. Continue BB. Requested cardiology consult.    COPD: no wheeze. Appears at baseline. Continue nebs  HYPERTENSION, BENIGN:  Controlled. Home BB held. cardizem started   CAD: see #1. No CP.    Code Status: full Family Communication:  Disposition Plan: home when ready. Likely tomorrow   Consultants: cardiology  Procedures:  none  Antibiotics:    HPI/Subjective: Sitting on side of bed. NAD reports breathing "easier"  Objective: Filed Vitals:   09/11/12 0100 09/11/12 0159 09/11/12 0539 09/11/12 1031  BP: 142/83 129/85 104/69   Pulse: 119 63 104   Temp:  97.3 F (36.3 C) 98 F (36.7 C)   TempSrc:  Oral Oral   Resp: 18 18 20    Height:  5\' 4"  (1.626 m)    Weight:  56 kg (123 lb 7.3 oz)    SpO2: 96% 97% 98% 87%    Intake/Output Summary (Last 24 hours) at 09/11/12 1136 Last data filed at 09/11/12 0900  Gross per 24 hour  Intake    240 ml  Output      0 ml  Net    240 ml   Filed Weights   09/11/12 0159  Weight: 56 kg (123 lb 7.3 oz)    Exam:   General:  Awake alert oriented x3  Cardiovascular: irregularly irregular No MGR No LE edema. Venous stasis changes  Respiratory: normal effort. BS somewhat distant but clear. No wheeze No crackles  Abdomen: flat soft +BS non-tender to palpation  Musculoskeletal: moves all extremities no joint swelling/erythema  Data Reviewed: Basic Metabolic Panel:  Recent Labs Lab 09/10/12 1034 09/10/12 2234  NA 140 136  K 5.1 3.6  CL 99 98  CO2 34* 26  GLUCOSE 90  152*  BUN 24* 24*  CREATININE 1.09 1.05  CALCIUM 10.3 9.4   Liver Function Tests: No results found for this basename: AST, ALT, ALKPHOS, BILITOT, PROT, ALBUMIN,  in the last 168 hours No results found for this basename: LIPASE, AMYLASE,  in the last 168 hours No results found for this basename: AMMONIA,  in the last 168 hours CBC:  Recent Labs Lab 09/10/12 2234  WBC 5.9  NEUTROABS 3.9  HGB 14.7  HCT 42.8  MCV 99.5  PLT 102*   Cardiac Enzymes:  Recent Labs Lab 09/10/12 2234 09/11/12 0511  TROPONINI <0.30 <0.30   BNP (last 3 results)  Recent Labs  05/18/12 0820 09/10/12 2234  PROBNP 2828.0* 1840.0*   CBG: No results found for this basename: GLUCAP,  in the last 168 hours  No results found for this or any previous visit (from the past 240 hour(s)).   Studies: Dg Chest 2 View  09/10/2012  *RADIOLOGY REPORT*  Clinical Data: Weakness, shortness of breath and irregular heart rate.  Bilateral lower extremity swelling.  CHEST - 2 VIEW  Comparison: Chest radiograph performed 05/18/2012  Findings: The lungs are hyperexpanded, with flattening of the hemidiaphragms, compatible with COPD.  Chronic bilateral scarring is seen; underlying vascular congestion is noted.  No definite pleural effusion or pneumothorax identified.  The heart is  borderline normal in size; diffuse calcification is noted along the thoracic and upper abdominal aorta.  No acute osseous abnormalities are seen.  Clips are noted within the right upper quadrant, reflecting prior cholecystectomy.  The patient is status post vertebroplasty at the upper thoracic spine.  IMPRESSION: Findings of COPD, with chronic bilateral scarring, but no definite evidence for acute superimposed focal airspace consolidation.   Original Report Authenticated By: Tonia Ghent, M.D.     Scheduled Meds: . arformoterol  15 mcg Nebulization BID  . aspirin EC  81 mg Oral q morning - 10a  . diltiazem  30 mg Oral Q12H  . enoxaparin (LOVENOX)  injection  30 mg Subcutaneous Q24H  . fluticasone  2 puff Inhalation BID  . [START ON 09/12/2012] furosemide  20 mg Oral Daily  . pantoprazole  40 mg Oral Daily  . potassium chloride  10 mEq Oral Daily  . sodium chloride  3 mL Intravenous Q12H  . sodium chloride  3 mL Intravenous Q12H  . tiotropium  18 mcg Inhalation Daily   Continuous Infusions:   Principal Problem:   CHF with right heart failure Active Problems:   HYPERLIPIDEMIA-MIXED   HYPERTENSION, BENIGN   CAD, NATIVE VESSEL   CAROTID STENOSIS   AORTIC ATHEROSCLEROSIS   PERIPHERAL VASCULAR DISEASE   COPD   REFLUX GASTRITIS   Atrial fibrillation with rapid ventricular response   Shortness of breath    Time spent: 30 minutes  [facility] M  [facility]  If 7PM-7AM, please contact night-coverage at [email or web address], password [facility] 09/11/2012, 11:36 AM  LOS: 1 day

## 2012-09-11 NOTE — H&P (Signed)
[facility] History and Physical  Abigail [name]:[id number] DOB: [birth year] DOA: 09/10/2012  Referring physician: EDP Dr. Dierdre [name] PCP: Abigail Antis, MD  Specialists: Dr. Tenny Craw, [facility]  Chief Complaint:   HPI: Abigail [name] is a 79 y.o. female with a past medical history significant for COPD, paroxysmal atrial fibrillation, coronary artery disease, peripheral vascular disease and right heart failure secondary to her lung disease on chronic home oxygen therapy who presents to the emergency department this evening complaining of heart palpitations and shortness of breath. In the emergency department she was found to have atrial fibrillation but not with a significant rapid ventricular response- her rate was around 90, her blood pressure was stable, oxygen saturations were also in the normal range on nasal cannula oxygen which she uses at home ATC. Labs revealed a significantly elevated BNP, nothing acute on chest x-Dombkowski, UA did not indicate an infection, admission requested for observation of her atrial fibrillation and also for adjustment of her home medication regimen which is currently only low dose of atenolol once daily. The patient lives alone, but so far has been able to maintain her independence, she has a son and a grandson who assist her when needed.   Review of Systems: The patient denies anorexia, fever, weight loss,, vision loss, decreased hearing, hoarseness, chest pain, syncope,hemoptysis, abdominal pain, melena, hematochezia, severe indigestion/heartburn, hematuria, genital sores, muscle weakness, suspicious skin lesions, transient blindness, depression, unusual weight change, abnormal bleeding, enlarged lymph nodes, angioedema, and breast masses.   Past Medical History  Diagnosis Date  . Asthma   . Heart disease   . Colon cancer   . CAD (coronary artery disease)     s/p PTCA RCA (Dr. Juliann [name])  . PVD (peripheral vascular disease)   . Atrial fibrillation   .  Hypertension   . Amenia   . COPD (chronic obstructive pulmonary disease) 2008    emphysemaous with asthmatic component-FEV 2008 34%  . Rectal bleeding   . SBO (small bowel obstruction)   . MRSA (methicillin resistant Staphylococcus aureus)   . Nontoxic multinodular goiter   . History of esophagitis     gastritis, duodenitis.  EGD neg 10/20/02  . Tachycardia   . Hypercholesterolemia   . Arteriovenous malformation of duodenum   . tobacco abuse, history of   . atherosclerosis   . Leg cramps     with mild claudication  . Sinus tachycardia   . Hip fracture     right, pins 2009  . Diverticulosis of colon   . Hx of colonic polyps   . Gall stones 10/1997    abd [place]  . Goiter     thyroid US- bx 06/26/03  . Femoral neck fracture     ORIF, Dr. Yisroel [name] 02/20/08  . Compression fracture of vertebra     T4/ kyphoplasty, hosp 12/31-06/02/10  . Colon cancer     Colonoscopy wnl 10/21/02  . Carcinoma of colon     s/p resection  . Skin cancer     L forearm 2012, per derm   Past Surgical History  Procedure Laterality Date  . Abdominal hysterectomy    . Hip fracture surgery    . Colon resection  05/1997    cancer and carcinoid tumor  . Ileostomy  05/1997    takedown 7/99  . Cardiac catheterization      stent/ ER, ok LE's 10/00. PV airtigraogy- stent 10/00  . Angioplasty  7/00    R CA  . Abdominal surgery  incarcerated ventral hernia, MRSA 06/28/00  . Cataract extraction  2004    bilateral  . Total abdominal hysterectomy     Social History:  reports that she has quit smoking. She has never used smokeless tobacco. She reports that she does not drink alcohol or use illicit drugs. Lives alone in an apartment, still drives short distances.  Allergies  Allergen Reactions  . Budesonide-Formoterol Fumarate     REACTION: STATES FEELS AS IF HAVING HEART ATTACK  . Sulfa Antibiotics Other (See Comments)    "just doesn't agree with me"  . Sulfonamide Derivatives     REACTION: unspecified  .  Diltiazem     Low pulse  . Metoprolol     Low pulse    Family History  Problem Relation Age of Onset  . Kidney disease Mother     died of kidney failure  . Cancer Father     stomach    Prior to Admission medications   Medication Sig Start Date End Date Taking? Authorizing Provider  acetaminophen (TYLENOL) 500 MG tablet Take 500 mg by mouth at bedtime as needed for pain.    Yes Historical Provider, MD  aspirin 81 MG tablet Take 81 mg by mouth every morning.    Yes Historical Provider, MD  Calcium 1500 MG tablet Take 1,500 mg by mouth every morning.    Yes Historical Provider, MD  cholecalciferol (VITAMIN D) 1000 UNITS tablet Take 1,000 Units by mouth daily.     Yes Historical Provider, MD  Cyanocobalamin (VITAMIN B-12) 500 MCG LOZG Take 500 mcg by mouth daily.    Yes Historical Provider, MD  Fluticasone-Salmeterol (ADVAIR) 250-50 MCG/DOSE AEPB Inhale 1 puff into the lungs every 12 (twelve) hours.   Yes Historical Provider, MD  furosemide (LASIX) 20 MG tablet Take 20 mg by mouth daily. 09/10/12 09/10/13 Yes Salley Scarlet, MD  GRAPE SEED PO Take 1 capsule by mouth 2 (two) times daily. Take 2 by mouth daily   Yes Historical Provider, MD  Multiple Vitamins-Minerals (CENTRUM SILVER PO) Take 1 tablet by mouth daily.    Yes Historical Provider, MD  Omega-3 Fatty Acids (FISH OIL) 1000 MG CAPS Take 1 capsule by mouth 2 (two) times daily.    Yes Historical Provider, MD  omeprazole (PRILOSEC) 20 MG capsule Take 20 mg by mouth every morning.    Yes Historical Provider, MD  potassium chloride (K-DUR,KLOR-CON) 10 MEQ tablet Take 1 tablet (10 mEq total) by mouth daily. 07/01/12  Yes Salley Scarlet, MD  nitroGLYCERIN (NITROSTAT) 0.4 MG SL tablet Place one under tongue every 5 minutes x 3 as needed 07/17/11   Joaquim Nam, MD   Physical Exam: Filed Vitals:   09/10/12 2247 09/11/12 0026 09/11/12 0100 09/11/12 0159  BP: 125/69 128/81 142/83 129/85  Pulse: 104 116 119 63  Temp: 98.6 F (37 C)    97.3 F (36.3 C)  TempSrc: Oral   Oral  Resp: 18 18 18 18   Height:    5\' 4"  (1.626 m)  Weight:    56 kg (123 lb 7.3 oz)  SpO2: 96% 95% 96% 97%     General:  No acute distress, nontoxic appearing, well appearing for her advanced age  Eyes: Nonicteric, EOMI  ENT: Normal moist mucous membranes  Neck: Supple, no thyromegaly, mild JVD  Cardiovascular: Irregular, no murmurs rubs or gallops  Respiratory: Fine crackles in bilateral bases, no wheezing, good air movement  Abdomen: Soft nontender  Skin: Multiple ecchymoses, on her lower  extremities there is bruising on her right foot and a small shin skin tear covered with band-aid, brawny tibial edema  Musculoskeletal: 1+ edema, pre-sacral, trace LE, moves all 4 extremities  Psychiatric: appropriate-memory appears to be very good, she recalls 1 year ago when I admitted her over at [facility].  Neurologic: non focal  Labs on Admission:  Basic Metabolic Panel:  Recent Labs Lab 09/10/12 1034 09/10/12 2234  NA 140 136  K 5.1 3.6  CL 99 98  CO2 34* 26  GLUCOSE 90 152*  BUN 24* 24*  CREATININE 1.09 1.05  CALCIUM 10.3 9.4   CBC:  Recent Labs Lab 09/10/12 2234  WBC 5.9  NEUTROABS 3.9  HGB 14.7  HCT 42.8  MCV 99.5  PLT 102*   Cardiac Enzymes:  Recent Labs Lab 09/10/12 2234  TROPONINI <0.30    BNP (last 3 results)  Recent Labs  05/18/12 0820 09/10/12 2234  PROBNP 2828.0* 1840.0*    Radiological Exams on Admission: Dg Chest 2 View  09/10/2012  *RADIOLOGY REPORT*  Clinical Data: Weakness, shortness of breath and irregular heart rate.  Bilateral lower extremity swelling.  CHEST - 2 VIEW  Comparison: Chest radiograph performed 05/18/2012  Findings: The lungs are hyperexpanded, with flattening of the hemidiaphragms, compatible with COPD.  Chronic bilateral scarring is seen; underlying vascular congestion is noted.  No definite pleural effusion or pneumothorax identified.  The heart is borderline normal in size; diffuse  calcification is noted along the thoracic and upper abdominal aorta.  No acute osseous abnormalities are seen.  Clips are noted within the right upper quadrant, reflecting prior cholecystectomy.  The patient is status post vertebroplasty at the upper thoracic spine.  IMPRESSION: Findings of COPD, with chronic bilateral scarring, but no definite evidence for acute superimposed focal airspace consolidation.   Original Report Authenticated By: Tonia Ghent, M.D.     EKG: Independently reviewed. Afib, RVR 100's  Assessment/Plan Principal Problem:   CHF with right heart failure Active Problems:   HYPERLIPIDEMIA-MIXED   HYPERTENSION, BENIGN   CAD, NATIVE VESSEL   CAROTID STENOSIS   AORTIC ATHEROSCLEROSIS   PERIPHERAL VASCULAR DISEASE   COPD   REFLUX GASTRITIS   Atrial fibrillation with rapid ventricular response   Shortness of breath  Ms. Breshears is a 79 year old who has multiple chronic end-stage medical problems but primarily has lung disease/COPD which has led to right heart failure and is probably the underlying cause of her atrial fibrillation with RVR,- typically she is in normal sinus rhythm but goes in and out of atrial fibrillation usually based on her work of breathing. She is on home O2. She is not on optimal COPD and he'll her regimen-in the past beta agonists have caused her to have palpitations and tachycardia since she has not been compliant with those treatments. On this admission she is not wheezing and her airflow is very good she does not have any evidence of bronchitis or respiratory infection at this time and no change in her sputum production. I admitted her for observation of her atrial fibrillation and for oral medication titration as well as for diuresis and observation of her respiratory status. She does not appear to be having a COPD exacerbation.   Admitted to telemetry for monitoring, started her on oral diltiazem, this worked very well for her 1 year ago despite a listed  hypersensitivity in her allergy list.   I do not think atenolol 25 once daily is going to maintain her rate control, and in general it  may be a better option to avoid beta blockers given her lung disease.  We'll schedule IV Lasix x2 and then transition to oral.   Started on twice daily Brovana nebs since she has B-Agonist induced tachycardia. Hopefully she can discharge with this medication  Started Pulmacort Inhaler and Spiriva  She would benefit greatly from a [facility] care manager or CHF home team/navigator to prevent rehospitalization.   Code Status: Full Code by Default, needs full conversation with son present. Family Communication: Discussed plan of care with patient. Disposition Plan: Home when medically improved, possibly this afternoon if rate controlled, encouraged in home caregiver, assistance. [facility] CM or CHF home team referral.  Time spent: 70 minutes  [facility] Pager [phone]  If 7PM-7AM, please contact night-coverage [email or web address] Password [facility] 09/11/2012, 5:44 AM

## 2012-09-11 NOTE — Progress Notes (Signed)
Patient evaluated for community based chronic disease management services with [facility] Care Management Program as a benefit of patient's [employer]. Patient will receive a post discharge transition of care call and will be evaluated for monthly home visits for assessments and disease process education. Spoke with patient at bedside to explain [facility] Care Management services. Patient has accepted services.  Written consents obtained.  PCP, address, and contact number verified.  Currently uses [place] for medications.  She has expressed some concerns about paying for medications due to the increase in medication she anticipates from her CHF diagnosis.  She is on home oxygen provided by [facility].  She prepares her own meals at home but eats out most of the time.  She has a scale but admits that it is old and she has not weighed herself with any specific frequency prior to this admission.  Left contact information and [facility] literature at bedside. Made inpatient Case Manager Amy Leanord Hawking aware that [facility] Care Management following. Of note, [facility] Care Management services does not replace or interfere with any services that are arranged by inpatient case management or social work.  For additional questions or referrals please contact Anibal Henderson BSN RN [facility] Liaison at [phone].

## 2012-09-11 NOTE — Progress Notes (Signed)
Patient seen, independently examined and chart reviewed. I agree with exam, assessment and plan discussed with Toya Smothers, NP.  Subjective: Feels better. Breathing better.  Objective: Afebrile , nontoxic in appearance. Tachycardic otherwise vitals stable. Appears calm and comfortable. Speech fluent and clear.  Labs: Cardiac enzymes negative thus far.  Acute issues:  Atrial fibrillation with rapid ventricular response: Not a warfarin candidate secondary to age per cardiology. Defer management to cardiology.  Right-sided heart failure: Continue Lasix. Defer to cardiology recommendations.  History of coronary artery disease, atrial fibrillation, COPD, chronic respiratory failure on oxygen, right heart failure  Noncompliant with COPD therapy secondary to tachycardia  Plan:  Follow cardiology recommendations  Summary: 79 year old woman followed closely by her primary care physician for lower extremity edema and atrial fibrillation. She has had 2 office visits in the last 2 weeks for the same. Her atrial fibrillation was asymptomatic as an outpatient so no changes were made her rate control. Lasix has been adjusted as an outpatient. She was admitted for rapid atrial fibrillation and increase shortness of breath.  Brendia Sacks, MD [facility] [phone]

## 2012-09-11 NOTE — Progress Notes (Signed)
UR Chart Review Completed  

## 2012-09-12 DIAGNOSIS — I509 Heart failure, unspecified: Secondary | ICD-10-CM

## 2012-09-12 DIAGNOSIS — J449 Chronic obstructive pulmonary disease, unspecified: Secondary | ICD-10-CM

## 2012-09-12 LAB — BASIC METABOLIC PANEL
BUN: 22 mg/dL (ref 6–23)
Calcium: 9.3 mg/dL (ref 8.4–10.5)
Creatinine, Ser: 1.08 mg/dL (ref 0.50–1.10)
GFR calc Af Amer: 51 mL/min — ABNORMAL LOW (ref >90)
GFR calc non Af Amer: 44 mL/min — ABNORMAL LOW (ref >90)
Glucose, Bld: 97 mg/dL (ref 70–99)
Potassium: 3.8 mEq/L (ref 3.5–5.1)

## 2012-09-12 MED ORDER — ATENOLOL 25 MG PO TABS: 50.0000 mg | ORAL_TABLET | Freq: Every day | ORAL | Status: DC

## 2012-09-12 MED ORDER — ATENOLOL 50 MG PO TABS: 50.0000 mg | ORAL_TABLET | Freq: Every day | ORAL | Status: DC

## 2012-09-12 NOTE — Progress Notes (Signed)
SUBJECTIVE; Feels great. Wants to go home.  Basic Metabolic Panel:  Recent Labs  78/29/56 2234 09/12/12 0447  NA 136 138  K 3.6 3.8  CL 98 98  CO2 26 34*  GLUCOSE 152* 97  BUN 24* 22  CREATININE 1.05 1.08  CALCIUM 9.4 9.3    Recent Labs  09/10/12 2234  WBC 5.9  NEUTROABS 3.9  HGB 14.7  HCT 42.8  MCV 99.5  PLT 102*   Cardiac Enzymes:  Recent Labs  09/10/12 2234 09/11/12 0511  TROPONINI <0.30 <0.30   Thyroid Function Tests:  Recent Labs  09/11/12 0305  TSH 1.210   RADIOLOGY: Dg Chest 2 View  09/10/2012  *RADIOLOGY REPORT*  Clinical Data: Weakness, shortness of breath and irregular heart rate.  Bilateral lower extremity swelling.  CHEST - 2 VIEW   IMPRESSION: Findings of COPD, with chronic bilateral scarring, but no definite evidence for acute superimposed focal airspace consolidation.   Original Report Authenticated By: Tonia Ghent, M.D.    PHYSICAL EXAM BP 105/68  Pulse 86  Temp(Src) 98.1 F (36.7 C) (Oral)  Resp 8  Ht 5\' 4"  (1.626 m)  Wt 117 lb 8.1 oz (53.3 kg)  BMI 20.16 kg/m2  SpO2 97% General: Well developed, well nourished, in no acute distress Head: Eyes PERRLA, No xanthomas.   Normal cephalic and atramatic  Lungs: Clear bilaterally to auscultation and percussion. Heart: HRIR S1 S2, No MRG .  Pulses are 2+ & equal.            No carotid bruit. No JVD.  No abdominal bruits. No femoral bruits. Abdomen: Bowel sounds are positive, abdomen soft and non-tender without masses or                  Hernia's noted. Msk:  Back normal, normal gait. Normal strength and tone for age. Extremities: No clubbing, cyanosis or edema.  DP +1 Neuro: Alert and oriented X 3. Psych:  Good affect, responds appropriately  TELEMETRY: Reviewed telemetry pt in :  Atrial fib in the 80's.   ASSESSMENT AND PLAN:  1. Atrial fibrillation: Persistent with rapid ventricular response. Duration is uncertain with history of PAF, although last office visit with Dr. Tenny Craw in  January also documented atrial fibrillation. The patient has been placed on diltiazem 30 mg every 12 hours. Home dose of atenolol was 25 mg daily and added yesterday. HR and BP tolerating both medications. . She has had no major wheezing despite history of COPD on beta blocker. The patient's CHADS2 score is 2 (Hypertension and CHF). No anticoagulation.Cardiology follow up appt made for 4.28/2014 [facility] for discharge from cardiology standpoint.  CAD: Recent pharmacological stress test was negative for ischemia in January of 2014. Echocardiogram revealed normal LV function, with only moderately dilated right atrium. Continue aspirin 81 mg daily.   3. Right Heart Failure: Elevated pro BNP on admission with lower extremity edema. She appears compensated on daily lasix at 20 mg. Creatinine, 1.08. Her weight has returned to baseline. .   4. COPD: Breathing status is at baseline, continues on O2  Kathryn M. Lyman Bishop NP [facility] 09/12/2012, 8:39 AM  Cardiology Attending Patient interviewed and examined. Discussed with Joni Reining, NP.  Above note annotated and modified based upon my findings.  Agree with plans for discharge and continued treatment strategy of heart rate control. Patient's advanced age and female gender convey a thromboembolic risk of greater than 5% per year, and the risk benefit ratio of full anticoagulation will  be reconsidered at patient's next office visit.  Kendall Bing, MD 09/12/2012, 5:28 PM

## 2012-09-12 NOTE — Progress Notes (Signed)
09/12/12 1415 Reviewed discharge instructions with patient and family member at bedside. Given copy of instructions, medication list, prescription, f/u appointments. Given heart failure education packet, booklet, and heart failure zones magnet. Discussed importance of daily weights, diet restrictions, and when to call MD. Verbalized understanding. Pt states would prefer to call Dr Jeanice Lim to schedule f/u appointment with her. Pt had scale for discharge for daily weights at home. Pt left floor in stable condition via w/c accompanied by nurse tech. Earnstine Regal, RN

## 2012-09-12 NOTE — Progress Notes (Signed)
09/12/12 1204 Patient ambulated in hallway with nurse supervision, tolerated well with no complaints. O2 sats 91% on r/a after ambulation. Patient states has home O2 for night and PRN use. Earnstine Regal, RN

## 2012-09-12 NOTE — Progress Notes (Signed)
[facility] PROGRESS NOTE  Abigail [name] ZOX:[id number] DOB: 1920-03-17 DOA: 09/10/2012 PCP: Milinda Antis, MD  Assessment/Plan: 1. Atrial fibrillation with rapid ventricular response: Rate controlled. Increase atenolol to suggest by cardiology. Discontinue diltiazem. She is tolerated both, blood pressure and rate control perspective and suspect will tolerate increased dose of beta blocker. She has been on atenolol for some time has no symptoms of bronchospasm with it. No anticoagulation per cardiology. CHADS2 score is 2. Has followup with cardiology in 4/28. 2. Right-sided heart failure: Compensated now, continue Lasix 20 mg daily. Counseled on restricting salt intake. Admits to dietary indiscretion. 3. COPD: Stable. Uses oxygen at night. 4. History of coronary artery disease: Stable. History of negative stress test 05/2012. Continue aspirin 81 mg daily.  Code Status: Full code Family Communication: Discussed with son at bedside Disposition Plan: Home with [facility] care management program.  Brendia Sacks, MD  [facility]  Pager [phone] If 7PM-7AM, please contact night-coverage at [email or web address], password [facility] 09/12/2012, 12:34 PM  LOS: 2 days   Brief narrative: 79 year old woman followed closely by her primary care physician for lower extremity edema and atrial fibrillation. She has had 2 office visits in the last 2 weeks for the same. Her atrial fibrillation was asymptomatic as an outpatient so no changes were made her rate control. Lasix has been adjusted as an outpatient. She was admitted for rapid atrial fibrillation and increased shortness of breath.  Consultants:  Terminous cardiology  Procedures:  None  HPI/Subjective: Feels better. No complaints. No pain, no shortness of breath. Decreased edema. Admits she likes salt and uses quite a bit of it.  Objective: Filed Vitals:   09/12/12 0500 09/12/12 0534 09/12/12 0950 09/12/12 1200  BP:  105/68    Pulse:  86     Temp:  98.1 F (36.7 C)    TempSrc:  Oral    Resp:  8    Height:      Weight: 53.3 kg (117 lb 8.1 oz)     SpO2:  97% 98% 91%    Intake/Output Summary (Last 24 hours) at 09/12/12 1234 Last data filed at 09/12/12 0900  Gross per 24 hour  Intake    240 ml  Output   1000 ml  Net   -760 ml   Filed Weights   09/11/12 0159 09/12/12 0500  Weight: 56 kg (123 lb 7.3 oz) 53.3 kg (117 lb 8.1 oz)    Exam:  General:  Appears calm and comfortable Cardiovascular: Irregular, normal right, no m/r/g. No significant LE edema. Telemetry: Atrial fibrillation, rate controlled Respiratory: CTA bilaterally, no w/r/r. Normal respiratory effort. Psychiatric: grossly normal mood and affect, speech fluent and appropriate  Data Reviewed: Basic Metabolic Panel:  Recent Labs Lab 09/10/12 1034 09/10/12 2234 09/12/12 0447  NA 140 136 138  K 5.1 3.6 3.8  CL 99 98 98  CO2 34* 26 34*  GLUCOSE 90 152* 97  BUN 24* 24* 22  CREATININE 1.09 1.05 1.08  CALCIUM 10.3 9.4 9.3   CBC:  Recent Labs Lab 09/10/12 2234  WBC 5.9  NEUTROABS 3.9  HGB 14.7  HCT 42.8  MCV 99.5  PLT 102*   Cardiac Enzymes:  Recent Labs Lab 09/10/12 2234 09/11/12 0511  TROPONINI <0.30 <0.30    Recent Labs  05/18/12 0820 09/10/12 2234  PROBNP 2828.0* 1840.0*     Studies: Dg Chest 2 View  09/10/2012  *RADIOLOGY REPORT*  Clinical Data: Weakness, shortness of breath and irregular heart rate.  Bilateral lower extremity  swelling.  CHEST - 2 VIEW  Comparison: Chest radiograph performed 05/18/2012  Findings: The lungs are hyperexpanded, with flattening of the hemidiaphragms, compatible with COPD.  Chronic bilateral scarring is seen; underlying vascular congestion is noted.  No definite pleural effusion or pneumothorax identified.  The heart is borderline normal in size; diffuse calcification is noted along the thoracic and upper abdominal aorta.  No acute osseous abnormalities are seen.  Clips are noted within the right  upper quadrant, reflecting prior cholecystectomy.  The patient is status post vertebroplasty at the upper thoracic spine.  IMPRESSION: Findings of COPD, with chronic bilateral scarring, but no definite evidence for acute superimposed focal airspace consolidation.   Original Report Authenticated By: Tonia Ghent, M.D.     Scheduled Meds: . arformoterol  15 mcg Nebulization BID  . aspirin EC  81 mg Oral q morning - 10a  . atenolol  25 mg Oral Daily  . diltiazem  30 mg Oral Q12H  . enoxaparin (LOVENOX) injection  30 mg Subcutaneous Q24H  . fluticasone  2 puff Inhalation BID  . furosemide  20 mg Oral Daily  . pantoprazole  40 mg Oral Daily  . potassium chloride  10 mEq Oral Daily  . sodium chloride  3 mL Intravenous Q12H  . sodium chloride  3 mL Intravenous Q12H  . tiotropium  18 mcg Inhalation Daily   Continuous Infusions:   Principal Problem:   CHF with right heart failure Active Problems:   HYPERLIPIDEMIA-MIXED   HYPERTENSION, BENIGN   CAD, NATIVE VESSEL   CAROTID STENOSIS   AORTIC ATHEROSCLEROSIS   PERIPHERAL VASCULAR DISEASE   COPD   REFLUX GASTRITIS   Atrial fibrillation with rapid ventricular response   Shortness of breath     Brendia Sacks, MD  [facility] Pager [phone] If 7PM-7AM, please contact night-coverage at [email or web address], password [place] 09/12/2012, 12:34 PM  LOS: 2 days

## 2012-09-12 NOTE — Discharge Summary (Signed)
Physician Discharge Summary  Abigail [name] ZOX:[id number] DOB: 04/24/1920 DOA: 09/10/2012  PCP: Milinda Antis, MD  Admit date: 09/10/2012 Discharge date: 09/12/2012  Recommendations for Outpatient Follow-up:  1. Followup rate control of atrial fibrillation. 2. Followup right-sided heart failure.   Follow-up Information   Follow up with Joni Reining, NP On 09/23/2012. (1  pm)    Contact information:   [facility] [address] [place] [address] [phone]       Follow up with Milinda Antis, MD In 1 week.   Contact information:   [address] [place] [address] [phone]      Discharge Diagnoses:  1. Atrial fibrillation with rapid ventricular response 2. Acute right-sided heart failure 3. COPD  Discharge Condition: Improved Disposition: Return home  Diet recommendation: Low-salt diet.  Filed Weights   09/11/12 0159 09/12/12 0500  Weight: 56 kg (123 lb 7.3 oz) 53.3 kg (117 lb 8.1 oz)    History of present illness:  79 year old woman followed closely by her primary care physician for lower extremity edema and atrial fibrillation. She has had 2 office visits in the last 2 weeks for the same. Her atrial fibrillation was asymptomatic as an outpatient so no changes were made her rate control. Lasix has been adjusted as an outpatient. She was admitted for rapid atrial fibrillation and increased shortness of breath.  Hospital Course:  Abigail [name] rapidly improved with IV Lasix and her rate control. She seen by cardiology in consultation. Her home dose of atenolol was resumed and she was initially placed on diltiazem as well. However her diltiazem was discontinued and atenolol increased as suggested by cardiology. Rate remains well controlled and patient is asymptomatic. In regard to heart failure she does admit to dietary indiscretion as he low-salt. She was counseled on appropriate salt intake she clearly recognizes the contribution of salt to  her heart failure. She will followup with cardiology as an outpatient. . 1. Atrial fibrillation with rapid ventricular response: Rate controlled. Increase atenolol to suggest by cardiology. Discontinue diltiazem. She is tolerated both, blood pressure and rate control perspective and suspect will tolerate increased dose of beta blocker. She has been on atenolol for some time has no symptoms of bronchospasm with it. No anticoagulation per cardiology. CHADS2 score is 2. Has followup with cardiology in 4/28. 2. Right-sided heart failure: Compensated now, continue Lasix 20 mg daily. Counseled on restricting salt intake. Admits to dietary indiscretion. 3. COPD: Stable. Uses oxygen at night. 4. History of coronary artery disease: Stable. History of negative stress test 05/2012. Continue aspirin 81 mg daily.  Consultants:  [place] [facility]  Procedures:  None  Discharge Instructions  Discharge Orders   Future Appointments Provider Department Dept Phone   09/23/2012 1:00 PM Jodelle Gross, NP [name] at [place] [phone]   12/10/2012 10:15 AM Salley Scarlet, MD [facility] [phone]   Future Orders Complete By Expires     Activity as tolerated - No restrictions  As directed     Diet - low sodium heart healthy  As directed     Discharge instructions  As directed     Comments:      Your dose of atenolol was increased to help control your heart rate. Continue Lasix. Be sure not to add salt to your food. Will use of daily. Call your physician for increased weight gain as directed. Call your physician or seek immediate medical attention for shortness of breath, pain or worsening  of condition.        Medication List    TAKE these medications       aspirin 81 MG tablet  Take 81 mg by mouth every morning.     atenolol 50 MG tablet  Commonly known as:  TENORMIN  Take 1 tablet (50 mg total) by mouth daily.  Start taking on:  09/13/2012     Calcium 1500 MG  tablet  Take 1,500 mg by mouth every morning.     CENTRUM SILVER PO  Take 1 tablet by mouth daily.     cholecalciferol 1000 UNITS tablet  Commonly known as:  VITAMIN D  Take 1,000 Units by mouth daily.     Fish Oil 1000 MG Caps  Take 1 capsule by mouth 2 (two) times daily.     Fluticasone-Salmeterol 250-50 MCG/DOSE Aepb  Commonly known as:  ADVAIR  Inhale 1 puff into the lungs every 12 (twelve) hours.     furosemide 20 MG tablet  Commonly known as:  LASIX  Take 20 mg by mouth daily.     GRAPE SEED PO  Take 1 capsule by mouth 2 (two) times daily. Take 2 by mouth daily     nitroGLYCERIN 0.4 MG SL tablet  Commonly known as:  NITROSTAT  Place one under tongue every 5 minutes x 3 as needed     potassium chloride 10 MEQ tablet  Commonly known as:  K-DUR,KLOR-CON  Take 1 tablet (10 mEq total) by mouth daily.     PRILOSEC 20 MG capsule  Generic drug:  omeprazole  Take 20 mg by mouth every morning.     TYLENOL 500 MG tablet  Generic drug:  acetaminophen  Take 500 mg by mouth at bedtime as needed for pain.     Vitamin B-12 500 MCG Lozg  Take 500 mcg by mouth daily.         The results of significant diagnostics from this hospitalization (including imaging, microbiology, ancillary and laboratory) are listed below for reference.    Significant Diagnostic Studies: Dg Chest 2 View  09/10/2012  *RADIOLOGY REPORT*  Clinical Data: Weakness, shortness of breath and irregular heart rate.  Bilateral lower extremity swelling.  CHEST - 2 VIEW  Comparison: Chest radiograph performed 05/18/2012  Findings: The lungs are hyperexpanded, with flattening of the hemidiaphragms, compatible with COPD.  Chronic bilateral scarring is seen; underlying vascular congestion is noted.  No definite pleural effusion or pneumothorax identified.  The heart is borderline normal in size; diffuse calcification is noted along the thoracic and upper abdominal aorta.  No acute osseous abnormalities are seen.   Clips are noted within the right upper quadrant, reflecting prior cholecystectomy.  The patient is status post vertebroplasty at the upper thoracic spine.  IMPRESSION: Findings of COPD, with chronic bilateral scarring, but no definite evidence for acute superimposed focal airspace consolidation.   Original Report Authenticated By: Tonia Ghent, M.D.    Labs: Basic Metabolic Panel:  Recent Labs Lab 09/10/12 1034 09/10/12 2234 09/12/12 0447  NA 140 136 138  K 5.1 3.6 3.8  CL 99 98 98  CO2 34* 26 34*  GLUCOSE 90 152* 97  BUN 24* 24* 22  CREATININE 1.09 1.05 1.08  CALCIUM 10.3 9.4 9.3   CBC:  Recent Labs Lab 09/10/12 2234  WBC 5.9  NEUTROABS 3.9  HGB 14.7  HCT 42.8  MCV 99.5  PLT 102*   Cardiac Enzymes:  Recent Labs Lab 09/10/12 2234 09/11/12 0511  TROPONINI <0.30 <0.30  Recent Labs  05/18/12 0820 09/10/12 2234  PROBNP 2828.0* 1840.0*    Principal Problem:   CHF with right heart failure Active Problems:   HYPERLIPIDEMIA-MIXED   HYPERTENSION, BENIGN   CAD, NATIVE VESSEL   CAROTID STENOSIS   AORTIC ATHEROSCLEROSIS   PERIPHERAL VASCULAR DISEASE   COPD   REFLUX GASTRITIS   Atrial fibrillation with rapid ventricular response   Shortness of breath   Time coordinating discharge: 25 minutes  Signed:  Brendia Sacks, MD [facility] 09/12/2012, 1:23 PM

## 2012-09-19 ENCOUNTER — Ambulatory Visit: Payer: Medicare Other | Admitting: Family Medicine

## 2012-09-20 ENCOUNTER — Ambulatory Visit (INDEPENDENT_AMBULATORY_CARE_PROVIDER_SITE_OTHER): Payer: Medicare Other | Admitting: Family Medicine

## 2012-09-20 VITALS — BP 118/64 | HR 71 | Resp 16 | Wt 123.0 lb

## 2012-09-20 DIAGNOSIS — R609 Edema, unspecified: Secondary | ICD-10-CM

## 2012-09-20 DIAGNOSIS — I4891 Unspecified atrial fibrillation: Secondary | ICD-10-CM

## 2012-09-20 DIAGNOSIS — I10 Essential (primary) hypertension: Secondary | ICD-10-CM

## 2012-09-20 NOTE — Patient Instructions (Addendum)
Have rose call and verify your appointment Continue current medications We will call and get your oxygen transferred

## 2012-09-22 ENCOUNTER — Encounter: Payer: Self-pay | Admitting: Family Medicine

## 2012-09-22 NOTE — Assessment & Plan Note (Signed)
BP tolerating increase in BB

## 2012-09-22 NOTE — Progress Notes (Signed)
  Subjective:    Patient ID: Abigail [name], female    DOB: 13-Apr-1920, 79 y.o.   MRN: [id number]  HPI Pt here to f/u hospital admission for A fib, doing well, leg swelling resolved, taking lasix daily, atenolol increased, states she gets a little dizzy at times after taking lasix but it resolves quickly. After she left office, that night became short of breath so went to ER as directed.  Gave me a notice on a change in her oxygen carrier   Review of Systems  GEN- denies fatigue, fever, weight loss,weakness, recent illness HEENT- denies eye drainage, change in vision, nasal discharge, CVS- denies chest pain, palpitations RESP- denies SOB, cough, wheeze ABD- denies N/V, change in stools, abd pain GU- denies dysuria, hematuria, dribbling, incontinence MSK- denies joint pain, muscle aches, injury Neuro- denies headache, dizziness, syncope, seizure activity      Objective:   Physical Exam  GEN- NAD, alert and oriented x3 HEENT- PERRL, EOMI, non injected sclera, pink conjunctiva, MMM, oropharynx clear Neck- Supple, no JVD CVS- Irregular Rhythm, normal rate, no murmur RESP-CTAB, normal WOB EXT- no edema,  Pulses- Radial, DP- decreased bilat        Assessment & Plan:

## 2012-09-22 NOTE — Assessment & Plan Note (Signed)
Doing well on change in dose, rate stable, BP looks okay

## 2012-09-22 NOTE — Assessment & Plan Note (Signed)
Doing well with lasix, she had some dizziness, will have to monitor, may have to take 1/2 tablet

## 2012-09-23 ENCOUNTER — Encounter: Payer: Self-pay | Admitting: Adult Health

## 2012-09-23 ENCOUNTER — Ambulatory Visit (INDEPENDENT_AMBULATORY_CARE_PROVIDER_SITE_OTHER): Payer: Medicare Other | Admitting: Adult Health

## 2012-09-23 ENCOUNTER — Telehealth: Payer: Self-pay

## 2012-09-23 VITALS — BP 106/70 | HR 100 | Wt 122.0 lb

## 2012-09-23 DIAGNOSIS — I50810 Right heart failure, unspecified: Secondary | ICD-10-CM

## 2012-09-23 DIAGNOSIS — I509 Heart failure, unspecified: Secondary | ICD-10-CM

## 2012-09-23 DIAGNOSIS — I4891 Unspecified atrial fibrillation: Secondary | ICD-10-CM

## 2012-09-23 DIAGNOSIS — I2510 Atherosclerotic heart disease of native coronary artery without angina pectoris: Secondary | ICD-10-CM

## 2012-09-23 MED ORDER — ATENOLOL 25 MG PO TABS: ORAL_TABLET | ORAL | Status: DC

## 2012-09-23 NOTE — Assessment & Plan Note (Signed)
Heart rate is not yet controlled. Will increase atenolol to 75 mg daily from 50 mg daily. She will return to the office in one month. I will change her ASA to coated ASA to help with heartburn symptoms.

## 2012-09-23 NOTE — Patient Instructions (Addendum)
Your physician recommends that you schedule a follow-up appointment in: ONE MONTH  Your physician recommends that you return for lab work in: BMET (TODAY-SLIPS GIVEN)  Your physician has recommended you make the following change in your medication:   1) INCREASE YOUR ATENOLOL TO 75MG  DAILY (TAKE ONE 25MG  TABLET AND ONE 50MG  TABLET ONE TIME DAILY) 2) CHANGE ASPIRIN TO ECCENTRIC COATED 81MG  ASPIRIN ONE TIME DAILY

## 2012-09-23 NOTE — Progress Notes (Signed)
HPI Abigail [name] is a 79 year old female patient of Dr. Tenny Craw we are following for ongoing assessment and management of atrial fibrillation, status post hospitalization in the setting of rapid atrial fibrillation, with history of COPD with oxygen dependence, CAD, peripheral vascular disease, and cor pulmonale. The patient was also admitted with some right-sided heart failure. She was compensated with IV Lasix and return to by mouth Lasix, atenolol was increased. No anticoagulation was initiated secondary to age.CHADS score 2.   She is complaining of heartburn. No heart racing or palpitations  Allergies  Allergen Reactions  . Budesonide-Formoterol Fumarate     REACTION: STATES FEELS AS IF HAVING HEART ATTACK  . Sulfa Antibiotics Other (See Comments)    "just doesn't agree with me"  . Sulfonamide Derivatives     REACTION: unspecified  . Diltiazem     Low pulse  . Metoprolol     Low pulse    Current Outpatient Prescriptions  Medication Sig Dispense Refill  . acetaminophen (TYLENOL) 500 MG tablet Take 500 mg by mouth at bedtime as needed for pain.       Marland Kitchen aspirin 81 MG tablet Take 81 mg by mouth every morning.       Marland Kitchen atenolol (TENORMIN) 50 MG tablet Take 1 tablet (50 mg total) by mouth daily.  30 tablet  0  . Calcium 1500 MG tablet Take 1,500 mg by mouth every morning.       . cholecalciferol (VITAMIN D) 1000 UNITS tablet Take 1,000 Units by mouth daily.        . Cyanocobalamin (VITAMIN B-12) 500 MCG LOZG Take 500 mcg by mouth daily.       . Fluticasone-Salmeterol (ADVAIR) 250-50 MCG/DOSE AEPB Inhale 1 puff into the lungs every 12 (twelve) hours.      . furosemide (LASIX) 20 MG tablet Take 20 mg by mouth daily.      Marland Kitchen GRAPE SEED PO Take 1 capsule by mouth 2 (two) times daily. Take 2 by mouth daily      . Multiple Vitamins-Minerals (CENTRUM SILVER PO) Take 1 tablet by mouth daily.       . nitroGLYCERIN (NITROSTAT) 0.4 MG SL tablet Place one under tongue every 5 minutes x 3 as needed  25  tablet  1  . Omega-3 Fatty Acids (FISH OIL) 1000 MG CAPS Take 1 capsule by mouth 2 (two) times daily.       Marland Kitchen omeprazole (PRILOSEC) 20 MG capsule Take 20 mg by mouth every morning.       . potassium chloride (K-DUR,KLOR-CON) 10 MEQ tablet Take 1 tablet (10 mEq total) by mouth daily.  30 tablet  3   No current facility-administered medications for this visit.    Past Medical History  Diagnosis Date  . Asthma   . Coronary atherosclerosis of native coronary artery     s/p PTCA RCA (Dr. Juliann Pares, [place])  . PVD (peripheral vascular disease)   . Atrial fibrillation   . Essential hypertension, benign   . Amenia   . COPD (chronic obstructive pulmonary disease) 2008    Emphysemaous with asthmatic component-FEV 2008 34%  . Rectal bleeding   . SBO (small bowel obstruction)   . MRSA (methicillin resistant Staphylococcus aureus)   . Nontoxic multinodular goiter   . History of esophagitis     Gastritis, duodenitis. EGD neg 10/20/02  . Hypercholesterolemia   . Arteriovenous malformation of duodenum   . Leg cramps     With mild claudication  . Diverticulosis  of colon   . Hx of colonic polyps   . Gall stones 10/1997  . Goiter     thyroid US- bx 06/26/03  . Femoral neck fracture     ORIF, Dr. Yisroel Ramming 02/20/08  . Compression fracture of vertebra     T4/ kyphoplasty, hosp 12/31-06/02/10  . Carcinoma of colon     s/p resection  . Skin cancer     L forearm 2012, per derm    Past Surgical History  Procedure Laterality Date  . Abdominal hysterectomy    . Hip fracture surgery    . Colon resection  05/1997    cancer and carcinoid tumor  . Ileostomy  05/1997    takedown 7/99  . Cardiac catheterization      stent/ ER, ok LE's 10/00. PV airtigraogy- stent 10/00  . Angioplasty  7/00    R CA  . Abdominal surgery      incarcerated ventral hernia, MRSA 06/28/00  . Cataract extraction  2004    bilateral  . Total abdominal hysterectomy      ZOX:WRUEAVW: Well developed, well nourished, in no  acute distress Head: Eyes PERRLA, No xanthomas.   Normal cephalic and atramatic  Lungs: Clear bilaterally to auscultation and percussion. Heart: HRIR  S1 S2, without MRG.  Pulses are 2+ & equal.            No carotid bruit. No JVD.  No abdominal bruits. No femoral bruits. Abdomen: Bowel sounds are positive, abdomen soft and non-tender without masses or                  Hernia's noted. Msk:  Back normal, normal gait. Normal strength and tone for age. Extremities: No clubbing, cyanosis or edema.  DP +1 Neuro: Alert and oriented X 3. Psych:  Good affect, responds appropriately PHYSICAL EXAM BP 106/70  Pulse 100  Wt 122 lb (55.339 kg)  BMI 20.93 kg/m2   [id number] fibrillation rate < 100bpm  ASSESSMENT AND PLAN

## 2012-09-23 NOTE — Telephone Encounter (Signed)
noted 

## 2012-09-23 NOTE — Assessment & Plan Note (Signed)
No overt symptoms of CHF. Continue lasix.  Will check BMET.

## 2012-09-23 NOTE — Assessment & Plan Note (Signed)
No cardiac complaints.No changes

## 2012-09-23 NOTE — Addendum Note (Signed)
Addended by: Derry Lory A on: 09/23/2012 01:37 PM   Modules accepted: Orders

## 2012-09-24 ENCOUNTER — Encounter: Payer: Self-pay | Admitting: *Deleted

## 2012-09-24 LAB — BASIC METABOLIC PANEL
BUN: 16 mg/dL (ref 6–23)
Calcium: 10.4 mg/dL (ref 8.4–10.5)
Creat: 1.07 mg/dL (ref 0.50–1.10)
Glucose, Bld: 96 mg/dL (ref 70–99)

## 2012-10-02 ENCOUNTER — Other Ambulatory Visit: Payer: Self-pay | Admitting: Family Medicine

## 2012-10-02 DIAGNOSIS — Z139 Encounter for screening, unspecified: Secondary | ICD-10-CM

## 2012-10-10 ENCOUNTER — Ambulatory Visit: Payer: Medicare Other | Admitting: Family Medicine

## 2012-10-11 ENCOUNTER — Ambulatory Visit (INDEPENDENT_AMBULATORY_CARE_PROVIDER_SITE_OTHER): Payer: Medicare Other | Admitting: Family Medicine

## 2012-10-11 ENCOUNTER — Encounter: Payer: Self-pay | Admitting: Family Medicine

## 2012-10-11 VITALS — BP 118/74 | HR 60 | Resp 16 | Wt 122.4 lb

## 2012-10-11 DIAGNOSIS — S81009A Unspecified open wound, unspecified knee, initial encounter: Secondary | ICD-10-CM

## 2012-10-11 DIAGNOSIS — S81809A Unspecified open wound, unspecified lower leg, initial encounter: Secondary | ICD-10-CM

## 2012-10-11 DIAGNOSIS — I4891 Unspecified atrial fibrillation: Secondary | ICD-10-CM

## 2012-10-11 DIAGNOSIS — S81801A Unspecified open wound, right lower leg, initial encounter: Secondary | ICD-10-CM | POA: Insufficient documentation

## 2012-10-11 MED ORDER — CEPHALEXIN 500 MG PO CAPS: 500.0000 mg | ORAL_CAPSULE | Freq: Two times a day (BID) | ORAL | Status: AC

## 2012-10-11 NOTE — Assessment & Plan Note (Signed)
Cleaned at bedside, with purelent drainage cover with antibiotics, [facility] to provide wound care until healed

## 2012-10-11 NOTE — Progress Notes (Signed)
  Subjective:    Patient ID: Abigail [name], female    DOB: 1919/10/18, 79 y.o.   MRN: [id number]  HPI  Pt here with right leg wound, was a small spot that opened, no specific injury, gets multiple skin tears. Evaluated by [facility] nurse, found to be draining pus with erythema sent to office for evaluation.  Denies fever, minimal pain in leg.   Review of Systems - per above  GEN- denies fatigue, fever, weight loss,weakness, recent illness HEENT- denies eye drainage, change in vision, nasal discharge, CVS- denies chest pain, palpitations RESP- denies SOB, cough, wheeze       Objective:   Physical Exam GEN- NAD, alert and oriented x3 CVS- Irregular Rhythm, normal rate, no murmur RESP-CTAB, normal WOB EXT- trace edema, Skin- Right shin dime size ulcer, 1cm erythema surrounding, green discharge noted, no odor,       Assessment & Plan:

## 2012-10-11 NOTE — Patient Instructions (Addendum)
Take the antibiotic as prescribed [facility] nurse to come look at this on Monday You can change bandage on Sunday  F/U as previous at [facility]

## 2012-10-11 NOTE — Assessment & Plan Note (Signed)
Rate controlled, doing well

## 2012-10-18 ENCOUNTER — Other Ambulatory Visit: Payer: Self-pay | Admitting: Family Medicine

## 2012-10-24 ENCOUNTER — Encounter: Payer: Self-pay | Admitting: Adult Health

## 2012-10-24 ENCOUNTER — Ambulatory Visit (INDEPENDENT_AMBULATORY_CARE_PROVIDER_SITE_OTHER): Payer: Medicare Other | Admitting: Adult Health

## 2012-10-24 VITALS — BP 110/58 | HR 87 | Ht 64.0 in | Wt 123.1 lb

## 2012-10-24 DIAGNOSIS — L97309 Non-pressure chronic ulcer of unspecified ankle with unspecified severity: Secondary | ICD-10-CM

## 2012-10-24 DIAGNOSIS — I509 Heart failure, unspecified: Secondary | ICD-10-CM

## 2012-10-24 DIAGNOSIS — J45909 Unspecified asthma, uncomplicated: Secondary | ICD-10-CM

## 2012-10-24 DIAGNOSIS — I872 Venous insufficiency (chronic) (peripheral): Secondary | ICD-10-CM

## 2012-10-24 DIAGNOSIS — I50810 Right heart failure, unspecified: Secondary | ICD-10-CM

## 2012-10-24 DIAGNOSIS — J449 Chronic obstructive pulmonary disease, unspecified: Secondary | ICD-10-CM

## 2012-10-24 DIAGNOSIS — I4891 Unspecified atrial fibrillation: Secondary | ICD-10-CM

## 2012-10-24 NOTE — Assessment & Plan Note (Signed)
Continues on 02 support via [place]. No wheezes with increased dose of atenolol.

## 2012-10-24 NOTE — Patient Instructions (Signed)
Your physician recommends that you continue on your current medications as directed. Please refer to the Current Medication list given to you today.  Your physician wants you to follow-up in: 6 months. You will receive a reminder letter in the mail two months in advance. If you don't receive a letter, please call our office to schedule the follow-up appointment.  

## 2012-10-24 NOTE — Progress Notes (Signed)
HPI: [name] is a 79 year old patient of Dr. Tenny Craw for follow for ongoing assessment and management of atrial fibrillation, with history of COPD with oxygen dependence, CAD, and peripheral vascular disease. She was last in the office on September 23 2012 as a posthospitalization followup. On that visit heart rate was not well controlled. We increased her atenolol the 75 mg daily. Aspirin was changed to enteric-coated to avoid heartburn. She is here for followup concerning response to medical therapy.  She has no complaints today, and is actually feeling better on increased dose. She comes today with wt recordings.   Allergies  Allergen Reactions  . Budesonide-Formoterol Fumarate     REACTION: STATES FEELS AS IF HAVING HEART ATTACK  . Sulfa Antibiotics Other (See Comments)    "just doesn't agree with me"  . Sulfonamide Derivatives     REACTION: unspecified  . Diltiazem     Low pulse  . Metoprolol     Low pulse    Current Outpatient Prescriptions  Medication Sig Dispense Refill  . acetaminophen (TYLENOL) 500 MG tablet Take 500 mg by mouth at bedtime as needed for pain.       Marland Kitchen ADVAIR DISKUS 250-50 MCG/DOSE AEPB INHALE 1 PUFF BY MOUTH TWICE DAILY. RINSE MOUTH AFTER USE.  60 each  3  . aspirin 81 MG tablet Take 81 mg by mouth every morning.       Marland Kitchen atenolol (TENORMIN) 25 MG tablet TAKE 25MG  ALONG WITH THE 50MG  TAB TO EQUAL 75MG  ONE TIME DAILY  30 tablet  5  . atenolol (TENORMIN) 50 MG tablet Take 50 mg by mouth daily. TAKE ONE 50MG  TABLET ALONG WITH ONE 25MG  TABLET TO EQUAL 75MG  DAILY      . Calcium 1500 MG tablet Take 1,500 mg by mouth every morning.       . cholecalciferol (VITAMIN D) 1000 UNITS tablet Take 1,000 Units by mouth daily.        . Cyanocobalamin (VITAMIN B-12) 500 MCG LOZG Take 500 mcg by mouth daily.       . furosemide (LASIX) 20 MG tablet Take 20 mg by mouth daily.      Marland Kitchen GRAPE SEED PO Take 1 capsule by mouth 2 (two) times daily. Take 2 by mouth daily      . Multiple  Vitamins-Minerals (CENTRUM SILVER PO) Take 1 tablet by mouth daily.       . nitroGLYCERIN (NITROSTAT) 0.4 MG SL tablet Place one under tongue every 5 minutes x 3 as needed  25 tablet  1  . Omega-3 Fatty Acids (FISH OIL) 1000 MG CAPS Take 1 capsule by mouth 2 (two) times daily.       Marland Kitchen omeprazole (PRILOSEC) 20 MG capsule Take 20 mg by mouth every morning.       . potassium chloride (K-DUR,KLOR-CON) 10 MEQ tablet Take 1 tablet (10 mEq total) by mouth daily.  30 tablet  3   No current facility-administered medications for this visit.    Past Medical History  Diagnosis Date  . Asthma   . Coronary atherosclerosis of native coronary artery     s/p PTCA RCA (Dr. Juliann Pares, [place])  . PVD (peripheral vascular disease)   . Atrial fibrillation   . Essential hypertension, benign   . Amenia   . COPD (chronic obstructive pulmonary disease) 2008    Emphysemaous with asthmatic component-FEV 2008 34%  . Rectal bleeding   . SBO (small bowel obstruction)   . MRSA (methicillin resistant Staphylococcus aureus)   .  Nontoxic multinodular goiter   . History of esophagitis     Gastritis, duodenitis. EGD neg 10/20/02  . Hypercholesterolemia   . Arteriovenous malformation of duodenum   . Leg cramps     With mild claudication  . Diverticulosis of colon   . Hx of colonic polyps   . Gall stones 10/1997  . Goiter     thyroid US- bx 06/26/03  . Femoral neck fracture     ORIF, Dr. Yisroel Ramming 02/20/08  . Compression fracture of vertebra     T4/ kyphoplasty, hosp 12/31-06/02/10  . Carcinoma of colon     s/p resection  . Skin cancer     L forearm 2012, per derm    Past Surgical History  Procedure Laterality Date  . Abdominal hysterectomy    . Hip fracture surgery    . Colon resection  05/1997    cancer and carcinoid tumor  . Ileostomy  05/1997    takedown 7/99  . Cardiac catheterization      stent/ ER, ok LE's 10/00. PV airtigraogy- stent 10/00  . Angioplasty  7/00    R CA  . Abdominal surgery       incarcerated ventral hernia, MRSA 06/28/00  . Cataract extraction  2004    bilateral  . Total abdominal hysterectomy      EXB:MWUXLK of systems complete and found to be negative unless listed above  PHYSICAL EXAM BP 110/58  Pulse 87  Ht 5\' 4"  (1.626 m)  Wt 123 lb 1.9 oz (55.847 kg)  BMI 21.12 kg/m2  General: Well developed, well nourished, in no acute distress Head: Eyes PERRLA, No xanthomas.   Normal cephalic and atramatic  Lungs: Clear bilaterally to auscultation and percussion. Heart: HRRR S1 S2, with 1/6 systolic murmur  Pulses are 2+ & equal.            No carotid bruit. No JVD.  No abdominal bruits. No femoral bruits. Abdomen: Bowel sounds are positive, abdomen soft and non-tender without masses or                  Hernia's noted. Msk:  Back normal, normal gait. Normal strength and tone for age. Extremities: No clubbing, cyanosis or edema.  DP +1 Neuro: Alert and oriented X 3. Psych:  Good affect, responds appropriately    ASSESSMENT AND PLAN

## 2012-10-24 NOTE — Assessment & Plan Note (Signed)
No evidence of decompensation. Wts are stable betweenm 120-123 lbs per her records which she brings with her. No changes in medication regimen.

## 2012-10-24 NOTE — Assessment & Plan Note (Signed)
Excellent heart rate control on increased dose of atenolol. Symptoms have improved. Will continue on current regimen without changes. She will see Dr. Tenny Craw or me in 6 months.

## 2012-10-29 ENCOUNTER — Telehealth: Payer: Self-pay | Admitting: Family Medicine

## 2012-10-29 NOTE — Telephone Encounter (Signed)
[name] from [facility] called and stated that Abigail [name] weight has increase from Sunday 122 to 125 today(Tues) she wanted this to be FYI. Is there anything you want her to do?

## 2012-10-29 NOTE — Telephone Encounter (Signed)
Abigail [name] is aware of message and will let the nurse know for tonight.

## 2012-10-29 NOTE — Telephone Encounter (Signed)
If her legs are swelling have her take an extra lasix tablet this evening with a potassium  Continue recording weights as previous

## 2012-10-31 ENCOUNTER — Ambulatory Visit (HOSPITAL_COMMUNITY)
Admission: RE | Admit: 2012-10-31 | Discharge: 2012-10-31 | Disposition: A | Discharge status: Home / Self Care | Payer: Medicare Other | Source: Ambulatory Visit | Attending: Family Medicine | Admitting: Family Medicine

## 2012-10-31 ENCOUNTER — Telehealth: Payer: Self-pay | Admitting: Family Medicine

## 2012-10-31 DIAGNOSIS — Z139 Encounter for screening, unspecified: Secondary | ICD-10-CM

## 2012-10-31 DIAGNOSIS — Z1231 Encounter for screening mammogram for malignant neoplasm of breast: Secondary | ICD-10-CM | POA: Insufficient documentation

## 2012-10-31 NOTE — Telephone Encounter (Signed)
Check her blood pressure, weight. Order STAT BMET

## 2012-10-31 NOTE — Telephone Encounter (Signed)
[name] from [facility] called and stated that pt has been c/o dizziness, swimmy headed, and balance is off. Wants to know what you want her to do?

## 2012-11-01 NOTE — Telephone Encounter (Signed)
[name] called back and will have requested information done for you.

## 2012-11-01 NOTE — Telephone Encounter (Signed)
I spoke with [facility] nurse [facility]. Patient metabolic panel was normal today was 3.9 sodium 141 glucose 118 CO2 32 BUN 18 creatinine 1.08. Her blood pressure has been 120 systolic her heart rate has been in the 80s. Her weight is back down to baseline. I spoke with patient she been having sneezing runny nose and feeling weak and dizzy headed when she would get up. She had one episode of diarrhea today which she stopped the use of Imodium. Today she feels much better compared to yesterday and has been drinking fluids. She denies any shortness of breath chest pain or headache. In she is improving on her own and has family members watch over her we will continue her current medications she will come in next week if her symptoms persist I would not add any medications at this time

## 2012-11-13 ENCOUNTER — Emergency Department (HOSPITAL_COMMUNITY): Payer: Medicare Other

## 2012-11-13 ENCOUNTER — Encounter (HOSPITAL_COMMUNITY): Payer: Self-pay | Admitting: *Deleted

## 2012-11-13 ENCOUNTER — Emergency Department (HOSPITAL_COMMUNITY)
Admission: EM | Admit: 2012-11-13 | Discharge: 2012-11-13 | Disposition: A | Discharge status: Home / Self Care | Payer: Medicare Other | Attending: Emergency Medicine | Admitting: Emergency Medicine

## 2012-11-13 ENCOUNTER — Telehealth: Payer: Self-pay | Admitting: Family Medicine

## 2012-11-13 DIAGNOSIS — Z8639 Personal history of other endocrine, nutritional and metabolic disease: Secondary | ICD-10-CM | POA: Insufficient documentation

## 2012-11-13 DIAGNOSIS — I2510 Atherosclerotic heart disease of native coronary artery without angina pectoris: Secondary | ICD-10-CM | POA: Insufficient documentation

## 2012-11-13 DIAGNOSIS — Z860100 Personal history of colon polyps, unspecified: Secondary | ICD-10-CM | POA: Insufficient documentation

## 2012-11-13 DIAGNOSIS — Z862 Personal history of diseases of the blood and blood-forming organs and certain disorders involving the immune mechanism: Secondary | ICD-10-CM | POA: Insufficient documentation

## 2012-11-13 DIAGNOSIS — Z8719 Personal history of other diseases of the digestive system: Secondary | ICD-10-CM | POA: Insufficient documentation

## 2012-11-13 DIAGNOSIS — Z7982 Long term (current) use of aspirin: Secondary | ICD-10-CM | POA: Insufficient documentation

## 2012-11-13 DIAGNOSIS — Z8614 Personal history of Methicillin resistant Staphylococcus aureus infection: Secondary | ICD-10-CM | POA: Insufficient documentation

## 2012-11-13 DIAGNOSIS — Z8739 Personal history of other diseases of the musculoskeletal system and connective tissue: Secondary | ICD-10-CM | POA: Insufficient documentation

## 2012-11-13 DIAGNOSIS — Y9389 Activity, other specified: Secondary | ICD-10-CM | POA: Insufficient documentation

## 2012-11-13 DIAGNOSIS — X500XXA Overexertion from strenuous movement or load, initial encounter: Secondary | ICD-10-CM | POA: Insufficient documentation

## 2012-11-13 DIAGNOSIS — Z85038 Personal history of other malignant neoplasm of large intestine: Secondary | ICD-10-CM | POA: Insufficient documentation

## 2012-11-13 DIAGNOSIS — Z79899 Other long term (current) drug therapy: Secondary | ICD-10-CM | POA: Insufficient documentation

## 2012-11-13 DIAGNOSIS — Z8679 Personal history of other diseases of the circulatory system: Secondary | ICD-10-CM | POA: Insufficient documentation

## 2012-11-13 DIAGNOSIS — Y92009 Unspecified place in unspecified non-institutional (private) residence as the place of occurrence of the external cause: Secondary | ICD-10-CM | POA: Insufficient documentation

## 2012-11-13 DIAGNOSIS — S93409A Sprain of unspecified ligament of unspecified ankle, initial encounter: Secondary | ICD-10-CM | POA: Insufficient documentation

## 2012-11-13 DIAGNOSIS — Z87891 Personal history of nicotine dependence: Secondary | ICD-10-CM | POA: Insufficient documentation

## 2012-11-13 DIAGNOSIS — J441 Chronic obstructive pulmonary disease with (acute) exacerbation: Secondary | ICD-10-CM | POA: Insufficient documentation

## 2012-11-13 DIAGNOSIS — I10 Essential (primary) hypertension: Secondary | ICD-10-CM | POA: Insufficient documentation

## 2012-11-13 DIAGNOSIS — Z85828 Personal history of other malignant neoplasm of skin: Secondary | ICD-10-CM | POA: Insufficient documentation

## 2012-11-13 MED ORDER — ACETAMINOPHEN 325 MG PO TABS
650.0000 mg | ORAL_TABLET | Freq: Once | ORAL | Status: AC
  Administered 2012-11-13: 650 mg via ORAL
  Filled 2012-11-13: qty 2

## 2012-11-13 MED ORDER — HYDROCODONE-ACETAMINOPHEN 5-325 MG PO TABS: ORAL_TABLET | ORAL | Status: DC

## 2012-11-13 NOTE — Telephone Encounter (Signed)
[name] from [facility] called wanted to know if she can continue to see pt. Home Health admission only done couple of visits and her time was done.

## 2012-11-13 NOTE — ED Notes (Signed)
Instructions, prescriptions and f/u information given/reviewed - verbalizes understanding.  

## 2012-11-13 NOTE — ED Notes (Addendum)
Lt ankle pain for 1 week, says she stepped in a hole in her yard and that may have caused injury. Pt is  On 02 prn at 2 L

## 2012-11-13 NOTE — ED Provider Notes (Signed)
History     CSN: [id number]  Arrival date & time 11/13/12  1212   First MD Initiated Contact with Patient 11/13/12 1300      Chief Complaint  Patient presents with  . Ankle Pain    (Consider location/radiation/quality/duration/timing/severity/associated sxs/prior treatment) HPI  Patient reports she started having pain in her "left ankle" about a week ago. She states she thinks she may have twisted her ankle however she does not recall a specific injury to her ankle. She states there is a hole in the yard close to where her tomato plant is that she waters. She denies any insect stings or bites. She has pain in her lateral heel and foot. She states it swells. She denies any fever. She states walking on it makes it feel worse. She states she has used Vicks salve and absorbine junior on it at night which helps. She states she may have had a ankle injury in the past but that was 70 years ago.  PCP Dr Jeanice Lim  Past Medical History  Diagnosis Date  . Asthma   . Coronary atherosclerosis of native coronary artery     s/p PTCA RCA (Dr. Juliann Pares, [place])  . PVD (peripheral vascular disease)   . Atrial fibrillation   . Essential hypertension, benign   . Amenia   . COPD (chronic obstructive pulmonary disease) 2008    Emphysemaous with asthmatic component-FEV 2008 34%  . Rectal bleeding   . SBO (small bowel obstruction)   . MRSA (methicillin resistant Staphylococcus aureus)   . Nontoxic multinodular goiter   . History of esophagitis     Gastritis, duodenitis. EGD neg 10/20/02  . Hypercholesterolemia   . Arteriovenous malformation of duodenum   . Leg cramps     With mild claudication  . Diverticulosis of colon   . Hx of colonic polyps   . Gall stones 10/1997  . Goiter     thyroid US- bx 06/26/03  . Femoral neck fracture     ORIF, Dr. Yisroel Ramming 02/20/08  . Compression fracture of vertebra     T4/ kyphoplasty, hosp 12/31-06/02/10  . Carcinoma of colon     s/p resection  . Skin cancer      L forearm 2012, per derm    Past Surgical History  Procedure Laterality Date  . Abdominal hysterectomy    . Hip fracture surgery    . Colon resection  05/1997    cancer and carcinoid tumor  . Ileostomy  05/1997    takedown 7/99  . Angioplasty  7/00    R CA  . Abdominal surgery      incarcerated ventral hernia, MRSA 06/28/00  . Cataract extraction  2004    bilateral  . Total abdominal hysterectomy    . Cardiac catheterization      stent/ ER, ok LE's 10/00. PV airtigraogy- stent 10/00    Family History  Problem Relation Age of Onset  . Kidney disease Mother     Died of kidney failure  . Cancer Father     Stomach    History  Substance Use Topics  . Smoking status: Former Smoker    Types: Cigarettes  . Smokeless tobacco: Never Used     Comment: quit 10-11 years ago  . Alcohol Use: No  lives at home Lives alone Home oxygen 2 lpm [place] at night and when she goes outside the house Quit smoking 15 years ago  OB History   Grav Para Term Preterm Abortions TAB SAB Ect  Mult Living                  Review of Systems  All other systems reviewed and are negative.    Allergies  Budesonide-formoterol fumarate; Sulfa antibiotics; Sulfonamide derivatives; Diltiazem; and Metoprolol  Home Medications   Current Outpatient Rx  Name  Route  Sig  Dispense  Refill  . ADVAIR DISKUS 250-50 MCG/DOSE AEPB      INHALE 1 PUFF BY MOUTH TWICE DAILY. RINSE MOUTH AFTER USE.   60 each   3   . aspirin 81 MG tablet   Oral   Take 81 mg by mouth every morning.          Marland Kitchen atenolol (TENORMIN) 25 MG tablet      TAKE 25MG  ALONG WITH THE 50MG  TAB TO EQUAL 75MG  ONE TIME DAILY   30 tablet   5   . atenolol (TENORMIN) 50 MG tablet   Oral   Take 50 mg by mouth daily. TAKE ONE 50MG  TABLET ALONG WITH ONE 25MG  TABLET TO EQUAL 75MG  DAILY         . Calcium 1500 MG tablet   Oral   Take 1,500 mg by mouth every morning.          Marland Kitchen CRANBERRY PO   Oral   Take 1 tablet by mouth 2 (two)  times daily.         . Cyanocobalamin (VITAMIN B 12 PO)   Oral   Take 500 mcg by mouth daily.         . furosemide (LASIX) 20 MG tablet   Oral   Take 20 mg by mouth daily.         Marland Kitchen GRAPE SEED PO   Oral   Take 1 capsule by mouth 2 (two) times daily.          . IRON PO   Oral   Take 0.5 tablets by mouth daily.         . Omega-3 Fatty Acids (FISH OIL) 1000 MG CAPS   Oral   Take 1 capsule by mouth 2 (two) times daily.          Marland Kitchen omeprazole (PRILOSEC) 20 MG capsule   Oral   Take 20 mg by mouth every morning.          . potassium chloride (K-DUR,KLOR-CON) 10 MEQ tablet   Oral   Take 1 tablet (10 mEq total) by mouth daily.   30 tablet   3   . acetaminophen (TYLENOL) 500 MG tablet   Oral   Take 500 mg by mouth at bedtime as needed for pain.          . cholecalciferol (VITAMIN D) 1000 UNITS tablet   Oral   Take 1,000 Units by mouth daily.           . Multiple Vitamins-Minerals (CENTRUM SILVER PO)   Oral   Take 1 tablet by mouth daily.          . nitroGLYCERIN (NITROSTAT) 0.4 MG SL tablet      Place one under tongue every 5 minutes x 3 as needed   25 tablet   1     BP 111/57  Pulse 78  Temp(Src) 98.1 F (36.7 C) (Oral)  Resp 17  Ht 5\' 4"  (1.626 m)  Wt 121 lb (54.885 kg)  BMI 20.76 kg/m2  SpO2 95%  Vital signs normal    Physical Exam  Nursing note and vitals reviewed. Constitutional:  She is oriented to person, place, and time.  Non-toxic appearance. She does not appear ill. No distress.  Frail elderly female  HENT:  Head: Normocephalic and atraumatic.  Right Ear: External ear normal.  Left Ear: External ear normal.  Nose: Nose normal. No mucosal edema or rhinorrhea.  Mouth/Throat: Mucous membranes are normal. No dental abscesses or edematous.  Eyes: Conjunctivae and EOM are normal. Pupils are equal, round, and reactive to light.  Neck: Normal range of motion and full passive range of motion without pain. Neck supple.   Pulmonary/Chest: Effort normal and breath sounds normal. No respiratory distress. She has no rhonchi. She exhibits no crepitus.  Abdominal: Normal appearance.  Musculoskeletal: Normal range of motion. She exhibits edema and tenderness.  Moves all extremities well.  Patient is noted to have diffuse swelling on the dorsum of her left foot over the metatarsals of her second through fifth toe that extends along the lateral aspect of her foot. This area is tender. She is nontender to palpation over the malleoli of her ankle. She is very tender inferior to the malleoli and over the lateral heel. Her proximal fibula/knee are nontender to palpation.  Neurological: She is alert and oriented to person, place, and time. She has normal strength. No cranial nerve deficit.  Skin: Skin is warm, dry and intact. No rash noted. No erythema. No pallor.  Psychiatric: She has a normal mood and affect. Her speech is normal and behavior is normal. Her mood appears not anxious.    ED Course  Procedures (including critical care time)  Medications  acetaminophen (TYLENOL) tablet 650 mg (650 mg Oral Given 11/13/12 1340)   Although patient states she's having pain in her ankle she actually has pain in her foot and swelling in her foot. She was sent back to get an x-Schimpf of her foot.  Pt placed in a ASO.  Dg Ankle Complete Left  11/13/2012   *RADIOLOGY REPORT*  Clinical Data: Lateral left ankle pain post fall this morning  LEFT ANKLE COMPLETE - 3+ VIEW  Comparison: 02/25/2011  Findings: Osseous demineralization. Ankle mortise intact. No acute fracture, dislocation, or bone destruction. Well-formed/mature periosteal new bone on the left tibial metadiaphysis.  IMPRESSION: No acute osseous abnormalities. Diffuse bony demineralization.   Original Report Authenticated By: Ulyses Southward, M.D.   Dg Foot Complete Left  11/13/2012   *RADIOLOGY REPORT*  Clinical Data: Pain and swelling of the lateral aspect of the foot.  LEFT FOOT -  COMPLETE 3+ VIEW  Comparison: Ankle radiographs dated 11/14/2012  Findings: There is no acute fracture or dislocation.  There is diffuse osteopenia.  Previous osteotomy of the first metatarsal with two K-wires in place.  Deformity of the distal aspect of the proximal phalanx of the little toe may be developmental.  Slight degenerative changes of the DIP joints of the second, third and fourth toes.  Soft tissue swelling of the dorsal aspect of the distal forefoot.  IMPRESSION: No acute osseous abnormality.   Original Report Authenticated By: Francene Boyers, M.D.     1. Sprain of ankle and foot, left, initial encounter     Discharge Medication List as of 11/13/2012  3:20 PM    START taking these medications   Details  HYDROcodone-acetaminophen (NORCO/VICODIN) 5-325 MG per tablet Take 1 or 2 po Q 6hrs for pain, Print        Plan discharge  Devoria Albe, MD, FACEP   MDM          Alden Hipp  Lynelle Doctor, MD 11/13/12 2211

## 2012-11-13 NOTE — Telephone Encounter (Signed)
y

## 2012-11-13 NOTE — ED Notes (Signed)
C/o left ankle pain x 1 week, gradually worsening.  Pt does not know of any specific injury, but thinks she may have stepped in a hole in her yard.  A&ox4; in no apparent distress.  Answers questions appropriately.

## 2012-11-14 NOTE — Telephone Encounter (Signed)
Per Dr. Jeanice Lim [facility] can continue the therapy

## 2012-11-18 ENCOUNTER — Encounter: Payer: Self-pay | Admitting: Family Medicine

## 2012-12-10 ENCOUNTER — Ambulatory Visit: Payer: Medicare Other | Admitting: Family Medicine

## 2012-12-10 ENCOUNTER — Ambulatory Visit (INDEPENDENT_AMBULATORY_CARE_PROVIDER_SITE_OTHER): Payer: Medicare Other | Admitting: Family Medicine

## 2012-12-10 ENCOUNTER — Encounter: Payer: Self-pay | Admitting: Family Medicine

## 2012-12-10 VITALS — BP 100/60 | HR 82 | Temp 97.5°F | Resp 18 | Wt 124.0 lb

## 2012-12-10 DIAGNOSIS — I50810 Right heart failure, unspecified: Secondary | ICD-10-CM

## 2012-12-10 DIAGNOSIS — I509 Heart failure, unspecified: Secondary | ICD-10-CM

## 2012-12-10 DIAGNOSIS — I10 Essential (primary) hypertension: Secondary | ICD-10-CM

## 2012-12-10 DIAGNOSIS — J449 Chronic obstructive pulmonary disease, unspecified: Secondary | ICD-10-CM

## 2012-12-10 DIAGNOSIS — R609 Edema, unspecified: Secondary | ICD-10-CM

## 2012-12-10 NOTE — Patient Instructions (Addendum)
Continue current medications  You are doing well! F/U 4 months

## 2012-12-10 NOTE — Assessment & Plan Note (Signed)
Currently stable, weights stable, no change to meds

## 2012-12-10 NOTE — Assessment & Plan Note (Signed)
Currently stable, 2L at needed

## 2012-12-10 NOTE — Assessment & Plan Note (Signed)
Well controlled with diuretics

## 2012-12-10 NOTE — Assessment & Plan Note (Signed)
Well controlled, no symptoms at current readings

## 2012-12-10 NOTE — Progress Notes (Signed)
  Subjective:    Patient ID: Abigail [name], female    DOB: 1919/11/01, 79 y.o.   MRN: [id number]  HPI  Patient to follow chronic medical problems. She was seen in the ER one month ago after an ankle sprain. She was seen by her podiatrist in interim for her nails and he also checked her ankle and gave her a cortisone shot in the right ankle. She's not had any difficulty since then.  She has no specific concerns today. Her weights at home have been 120-123. She still has a home health nurse secondary to her CHF and A. fib her blood pressures have ranged 106-126/60-78 Her oxygen levels have been 93-94% on room air she does have 2 L if needed.  Sophronia Simas [facility] [phone]  Review of Systems   GEN- denies fatigue, fever, weight loss,weakness, recent illness HEENT- denies eye drainage, change in vision, nasal discharge, CVS- denies chest pain, palpitations RESP- denies SOB, cough, wheeze ABD- denies N/V, change in stools, abd pain GU- denies dysuria, hematuria, dribbling, incontinence MSK- denies joint pain, muscle aches, injury Neuro- denies headache, dizziness, syncope, seizure activity      Objective:   Physical Exam GEN- NAD, alert and oriented x3 HEENT- PERRL, EOMI, non injected sclera, pink conjunctiva, MMM, oropharynx clear Neck- Supple,  CVS- irregular rhythm, normal rate, no murmur RESP-CTAB EXT- +pedal  Edema Skin- in tact, no skin tears MSK- Normal inspection bilat ankles, good ROM, no pain with ROM, normal gait Pulses- Radial, DP- 2+        Assessment & Plan:

## 2012-12-11 DIAGNOSIS — I872 Venous insufficiency (chronic) (peripheral): Secondary | ICD-10-CM

## 2012-12-11 DIAGNOSIS — J449 Chronic obstructive pulmonary disease, unspecified: Secondary | ICD-10-CM

## 2012-12-11 DIAGNOSIS — J45909 Unspecified asthma, uncomplicated: Secondary | ICD-10-CM

## 2012-12-11 DIAGNOSIS — L97309 Non-pressure chronic ulcer of unspecified ankle with unspecified severity: Secondary | ICD-10-CM

## 2012-12-30 ENCOUNTER — Telehealth: Payer: Self-pay | Admitting: Family Medicine

## 2012-12-30 MED ORDER — POTASSIUM CHLORIDE CRYS ER 10 MEQ PO TBCR: 10.0000 meq | EXTENDED_RELEASE_TABLET | Freq: Every day | ORAL | Status: DC

## 2012-12-30 NOTE — Telephone Encounter (Signed)
Rx Refilled  

## 2013-01-01 ENCOUNTER — Telehealth: Payer: Self-pay | Admitting: Family Medicine

## 2013-01-02 NOTE — Telephone Encounter (Signed)
LM for [name] to return call

## 2013-02-21 ENCOUNTER — Ambulatory Visit (INDEPENDENT_AMBULATORY_CARE_PROVIDER_SITE_OTHER): Payer: Medicare Other | Admitting: Family Medicine

## 2013-02-21 DIAGNOSIS — Z23 Encounter for immunization: Secondary | ICD-10-CM

## 2013-03-18 ENCOUNTER — Telehealth: Payer: Self-pay | Admitting: Family Medicine

## 2013-03-18 MED ORDER — FLUTICASONE-SALMETEROL 250-50 MCG/DOSE IN AEPB: 1.0000 | INHALATION_SPRAY | Freq: Two times a day (BID) | RESPIRATORY_TRACT | Status: DC

## 2013-03-18 NOTE — Telephone Encounter (Signed)
Advair Diskus 250/50

## 2013-04-01 ENCOUNTER — Ambulatory Visit (INDEPENDENT_AMBULATORY_CARE_PROVIDER_SITE_OTHER): Payer: Medicare Other | Admitting: Cardiology

## 2013-04-01 ENCOUNTER — Encounter: Payer: Self-pay | Admitting: Cardiology

## 2013-04-01 VITALS — BP 121/68 | HR 89 | Ht 64.0 in | Wt 126.0 lb

## 2013-04-01 DIAGNOSIS — I2510 Atherosclerotic heart disease of native coronary artery without angina pectoris: Secondary | ICD-10-CM

## 2013-04-01 DIAGNOSIS — E785 Hyperlipidemia, unspecified: Secondary | ICD-10-CM

## 2013-04-01 DIAGNOSIS — I4891 Unspecified atrial fibrillation: Secondary | ICD-10-CM

## 2013-04-01 NOTE — Patient Instructions (Addendum)
Your physician recommends that you schedule a follow-up appointment in: 2 months  

## 2013-04-01 NOTE — Progress Notes (Signed)
Clinical Summary Abigail [name] is a 79 y.o.female seen today for follow up of the following medical problems, this is her first visit with me.   1. CAD - prior PTCA to RCA - Jan 2014 LVEF 55-60%, also at that time had stress MPI which showed no ischemia - denies any recent chest pain. Denies any significant SOB or DOE. - compliant with meds: ASA, atenolol. Prior muscle aches on statins  2. Afib - compliant with atenolol - denies any palpitations. She has not been on anticoagulation, previous decision making and discussions on anticoagulation are unclear from the notes. The patient does not recall specifically any details about this.    3. PAD - denies any recent leg pain  4. HTN - does not check at home - compliant with meds  5. Hyperlipidemia - reports muscle aches on lipitor, not on statin currently  - lipid panel 07/2012: TC 163 TG 58 HDL 62 LDL 89   Past Medical History  Diagnosis Date  . Asthma   . Coronary atherosclerosis of native coronary artery     s/p PTCA RCA (Dr. Juliann Pares, [place])  . PVD (peripheral vascular disease)   . Atrial fibrillation   . Essential hypertension, benign   . Amenia   . COPD (chronic obstructive pulmonary disease) 2008    Emphysemaous with asthmatic component-FEV 2008 34%  . Rectal bleeding   . SBO (small bowel obstruction)   . MRSA (methicillin resistant Staphylococcus aureus)   . Nontoxic multinodular goiter   . History of esophagitis     Gastritis, duodenitis. EGD neg 10/20/02  . Hypercholesterolemia   . Arteriovenous malformation of duodenum   . Leg cramps     With mild claudication  . Diverticulosis of colon   . Hx of colonic polyps   . Gall stones 10/1997  . Goiter     thyroid US- bx 06/26/03  . Femoral neck fracture     ORIF, Dr. Yisroel Ramming 02/20/08  . Compression fracture of vertebra     T4/ kyphoplasty, hosp 12/31-06/02/10  . Carcinoma of colon     s/p resection  . Skin cancer     L forearm 2012, per derm     Allergies   Allergen Reactions  . Budesonide-Formoterol Fumarate     REACTION: STATES FEELS AS IF HAVING HEART ATTACK  . Sulfa Antibiotics Other (See Comments)    "just doesn't agree with me"  . Sulfonamide Derivatives     REACTION: unspecified  . Diltiazem     Low pulse  . Metoprolol     Low pulse     Current Outpatient Prescriptions  Medication Sig Dispense Refill  . acetaminophen (TYLENOL) 500 MG tablet Take 500 mg by mouth at bedtime as needed for pain.       Marland Kitchen aspirin 81 MG tablet Take 81 mg by mouth every morning.       Marland Kitchen atenolol (TENORMIN) 25 MG tablet TAKE 25MG  ALONG WITH THE 50MG  TAB TO EQUAL 75MG  ONE TIME DAILY  30 tablet  5  . atenolol (TENORMIN) 50 MG tablet Take 50 mg by mouth daily. TAKE ONE 50MG  TABLET ALONG WITH ONE 25MG  TABLET TO EQUAL 75MG  DAILY      . Calcium 1500 MG tablet Take 1,500 mg by mouth every morning.       . cholecalciferol (VITAMIN D) 1000 UNITS tablet Take 1,000 Units by mouth daily.        Marland Kitchen CRANBERRY PO Take 1 tablet by mouth 2 (  two) times daily.      . Cyanocobalamin (VITAMIN B 12 PO) Take 500 mcg by mouth daily.      . Fluticasone-Salmeterol (ADVAIR DISKUS) 250-50 MCG/DOSE AEPB Inhale 1 puff into the lungs 2 (two) times daily.  60 each  3  . furosemide (LASIX) 20 MG tablet Take 20 mg by mouth daily.      Marland Kitchen GRAPE SEED PO Take 1 capsule by mouth 2 (two) times daily.       . IRON PO Take 0.5 tablets by mouth daily.      . Multiple Vitamins-Minerals (CENTRUM SILVER PO) Take 1 tablet by mouth daily.       . nitroGLYCERIN (NITROSTAT) 0.4 MG SL tablet Place one under tongue every 5 minutes x 3 as needed  25 tablet  1  . Omega-3 Fatty Acids (FISH OIL) 1000 MG CAPS Take 1 capsule by mouth 2 (two) times daily.       Marland Kitchen omeprazole (PRILOSEC) 20 MG capsule Take 20 mg by mouth every morning.       . potassium chloride (K-DUR,KLOR-CON) 10 MEQ tablet Take 1 tablet (10 mEq total) by mouth daily.  30 tablet  3   No current facility-administered medications for this visit.       Past Surgical History  Procedure Laterality Date  . Abdominal hysterectomy    . Hip fracture surgery    . Colon resection  05/1997    cancer and carcinoid tumor  . Ileostomy  05/1997    takedown 7/99  . Angioplasty  7/00    R CA  . Abdominal surgery      incarcerated ventral hernia, MRSA 06/28/00  . Cataract extraction  2004    bilateral  . Total abdominal hysterectomy    . Cardiac catheterization      stent/ ER, ok LE's 10/00. PV airtigraogy- stent 10/00     Allergies  Allergen Reactions  . Budesonide-Formoterol Fumarate     REACTION: STATES FEELS AS IF HAVING HEART ATTACK  . Sulfa Antibiotics Other (See Comments)    "just doesn't agree with me"  . Sulfonamide Derivatives     REACTION: unspecified  . Diltiazem     Low pulse  . Metoprolol     Low pulse      Family History  Problem Relation Age of Onset  . Kidney disease Mother     Died of kidney failure  . Cancer Father     Stomach     Social History [name] reports that she has quit smoking. Her smoking use included Cigarettes. She smoked 0.00 packs per day. She has never used smokeless tobacco. [name] reports that she does not drink alcohol.   Review of Systems CONSTITUTIONAL: No weight loss, fever, chills, weakness or fatigue.  HEENT: Eyes: No visual loss, blurred vision, double vision or yellow sclerae.No hearing loss, sneezing, congestion, runny nose or sore throat.  SKIN: No rash or itching.  CARDIOVASCULAR: per HPI RESPIRATORY: per HPI  GASTROINTESTINAL: No anorexia, nausea, vomiting or diarrhea. No abdominal pain or blood.  GENITOURINARY: No burning on urination, no polyuria NEUROLOGICAL: No headache, dizziness, syncope, paralysis, ataxia, numbness or tingling in the extremities. No change in bowel or bladder control.  MUSCULOSKELETAL: No muscle, back pain, joint pain or stiffness.  LYMPHATICS: No enlarged nodes. No history of splenectomy.  PSYCHIATRIC: No history of depression or anxiety.   ENDOCRINOLOGIC: No reports of sweating, cold or heat intolerance. No polyuria or polydipsia.  Marland Kitchen   Physical  Examination p 89 bp 121/68 Wt 126 lbs BMI 22 Gen: resting comfortably, no acute distress HEENT: no scleral icterus, pupils equal round and reactive, no palptable cervical adenopathy,  CV: irreg, rate 85, no m/r/g, no JVD, no carotid bruits Resp: Clear to auscultation bilaterally GI: abdomen is soft, non-tender, non-distended, normal bowel sounds, no hepatosplenomegaly MSK: extremities are warm, no edema.  Skin: warm, no rash Neuro:  no focal deficits Psych: appropriate affect   Diagnostic Studies  Jan 2014 Echo: LVEF 55-60%, no WMAs,   Jan 2014 MPI:  Negative pharmacologic stress nuclear myocardial study revealing no significant stress-induced EKG abnormalities, normal left ventricular size, normal left ventricular systolic function and normal myocardial perfusion except for prominent physiologic apical thinning and diaphragmatic attenuation. Other findings as noted.    Assessment and Plan  1. CAD - no current symptoms - continue current medications  2. Afib - denies any symptoms - continue rate contorl with atenolol - this is her first visit with me, the previous discussions and decisions on anticoagulation are unclear from the many notes I've reviewed. Her CHADS2 score is 2 (age, HTN). She has documented in her past history rectal bleeding and history of duodenal AVMs, the exact details of both this diagnoses is unclear. I will continue to review back into her records for any contraindication to anticoagulation, and she will return in 2 months to readdress the issue. She is 43 but is independent with no history of falls, she may be reasonable anticoag candidate unless there is a clear contraindication based on her prior records.   3. HTN - at goal, continue current meds  4. HL - reports myalgias to statins previously, unclear which ones she head this reaction to.  LDL slightly above goal of 70, will review records to see which statins she had adverse reactions to. She may be able to tolerate a mild statin such as pravastatin if not tried before.   Antoine Poche, M.D., F.A.C.C.

## 2013-04-14 ENCOUNTER — Ambulatory Visit (HOSPITAL_COMMUNITY)
Admission: RE | Admit: 2013-04-14 | Discharge: 2013-04-14 | Disposition: A | Discharge status: Home / Self Care | Payer: Medicare Other | Source: Ambulatory Visit | Attending: Family Medicine | Admitting: Family Medicine

## 2013-04-14 ENCOUNTER — Encounter: Payer: Self-pay | Admitting: Family Medicine

## 2013-04-14 ENCOUNTER — Ambulatory Visit (INDEPENDENT_AMBULATORY_CARE_PROVIDER_SITE_OTHER): Payer: Medicare Other | Admitting: Family Medicine

## 2013-04-14 VITALS — BP 110/60 | HR 78 | Temp 97.4°F | Resp 18

## 2013-04-14 DIAGNOSIS — M25551 Pain in right hip: Secondary | ICD-10-CM

## 2013-04-14 DIAGNOSIS — M545 Low back pain, unspecified: Secondary | ICD-10-CM | POA: Insufficient documentation

## 2013-04-14 DIAGNOSIS — M4120 Other idiopathic scoliosis, site unspecified: Secondary | ICD-10-CM | POA: Insufficient documentation

## 2013-04-14 DIAGNOSIS — I700 Atherosclerosis of aorta: Secondary | ICD-10-CM | POA: Insufficient documentation

## 2013-04-14 DIAGNOSIS — I998 Other disorder of circulatory system: Secondary | ICD-10-CM | POA: Insufficient documentation

## 2013-04-14 DIAGNOSIS — M549 Dorsalgia, unspecified: Secondary | ICD-10-CM

## 2013-04-14 DIAGNOSIS — M25559 Pain in unspecified hip: Secondary | ICD-10-CM

## 2013-04-14 DIAGNOSIS — I509 Heart failure, unspecified: Secondary | ICD-10-CM

## 2013-04-14 DIAGNOSIS — M533 Sacrococcygeal disorders, not elsewhere classified: Secondary | ICD-10-CM

## 2013-04-14 DIAGNOSIS — I50810 Right heart failure, unspecified: Secondary | ICD-10-CM

## 2013-04-14 DIAGNOSIS — R197 Diarrhea, unspecified: Secondary | ICD-10-CM

## 2013-04-14 DIAGNOSIS — M51379 Other intervertebral disc degeneration, lumbosacral region without mention of lumbar back pain or lower extremity pain: Secondary | ICD-10-CM | POA: Insufficient documentation

## 2013-04-14 DIAGNOSIS — M5137 Other intervertebral disc degeneration, lumbosacral region: Secondary | ICD-10-CM | POA: Insufficient documentation

## 2013-04-14 DIAGNOSIS — M47817 Spondylosis without myelopathy or radiculopathy, lumbosacral region: Secondary | ICD-10-CM | POA: Insufficient documentation

## 2013-04-14 LAB — URINALYSIS, ROUTINE W REFLEX MICROSCOPIC
Hgb urine dipstick: NEGATIVE
Leukocytes, UA: NEGATIVE
Protein, ur: NEGATIVE mg/dL
Urobilinogen, UA: 1 mg/dL (ref 0.0–1.0)

## 2013-04-14 LAB — CBC WITH DIFFERENTIAL/PLATELET
Eosinophils Absolute: 0.1 10*3/uL (ref 0.0–0.7)
Eosinophils Relative: 1 % (ref 0–5)
HCT: 44.5 % (ref 36.0–46.0)
Lymphocytes Relative: 24 % (ref 12–46)
Lymphs Abs: 1.8 10*3/uL (ref 0.7–4.0)
MCH: 34.9 pg — ABNORMAL HIGH (ref 26.0–34.0)
MCV: 102.8 fL — ABNORMAL HIGH (ref 78.0–100.0)
Monocytes Absolute: 0.8 10*3/uL (ref 0.1–1.0)
Platelets: 117 10*3/uL — ABNORMAL LOW (ref 150–400)
RBC: 4.33 MIL/uL (ref 3.87–5.11)
WBC: 7.4 10*3/uL (ref 4.0–10.5)

## 2013-04-14 LAB — COMPREHENSIVE METABOLIC PANEL
ALT: 15 U/L (ref 0–35)
Alkaline Phosphatase: 69 U/L (ref 39–117)
CO2: 32 mEq/L (ref 19–32)
Creat: 1.09 mg/dL (ref 0.50–1.10)
Glucose, Bld: 67 mg/dL — ABNORMAL LOW (ref 70–99)
Sodium: 143 mEq/L (ref 135–145)
Total Bilirubin: 2 mg/dL — ABNORMAL HIGH (ref 0.3–1.2)
Total Protein: 6.7 g/dL (ref 6.0–8.3)

## 2013-04-14 NOTE — Patient Instructions (Signed)
Xray to be done of back and hip  Labs to be done today Continue immodium Continue all other medications F/U 4 months

## 2013-04-15 DIAGNOSIS — M549 Dorsalgia, unspecified: Secondary | ICD-10-CM | POA: Insufficient documentation

## 2013-04-15 DIAGNOSIS — R197 Diarrhea, unspecified: Secondary | ICD-10-CM | POA: Insufficient documentation

## 2013-04-15 DIAGNOSIS — M533 Sacrococcygeal disorders, not elsewhere classified: Secondary | ICD-10-CM | POA: Insufficient documentation

## 2013-04-15 NOTE — Assessment & Plan Note (Signed)
She's currently compensated. Continue the Lasix and her atenolol

## 2013-04-15 NOTE — Assessment & Plan Note (Signed)
Acute. onset of diarrhea. Likely require will a day. She overall looks well. I will check her metabolic panel as she is high-risk for worsening renal insufficiency and electrolyte disturbances/ she can continue Imodium

## 2013-04-15 NOTE — Progress Notes (Signed)
  Subjective:    Patient ID: Abigail [name], female    DOB: 1919-08-25, 79 y.o.   MRN: [id number]  HPI  Patient here to follow chronic medical problems. She's had some diarrhea over the past 3 days about 2-3 times a day. She does not have any abdominal pain or nausea vomiting associated she's not had any fever. She's been taking Imodium which does help slow down the diarrhea.  She also complains of back pain over the past few months. She's not had any recent falls. The pain is in her lower back and on her right buttock cheek. She takes Tylenol which helps relieve the pain.  Atrial fibrillation she's not had any palpitations or chest pain. She's tolerating her medication   Review of Systems  GEN- denies fatigue, fever, weight loss,weakness, recent illness HEENT- denies eye drainage, change in vision, nasal discharge, CVS- denies chest pain, palpitations RESP- denies SOB, cough, wheeze ABD- denies N/V, +change in stools, abd pain GU- denies dysuria, hematuria, dribbling, incontinence MSK- + joint pain, muscle aches, injury Neuro- denies headache, dizziness, syncope, seizure activity      Objective:   Physical Exam GEN- NAD, alert and oriented x3 HEENT- PERRL, EOMI, non injected sclera, pink conjunctiva, MMM, oropharynx clear CVS- irregular rhythm, normal rate, no murmur RESP-CTAB ABD-NABS,soft, NT, ND EXT- +pedal  Edema MSK- Back- mild TTP lower lumbar spine, TTP over Right SI joint, no pain with extension or flexion, decreased ROM in back and hips Pulses- Radial, DP- 2+       Assessment & Plan:

## 2013-04-15 NOTE — Assessment & Plan Note (Signed)
She has pain over her SI joint. She has had bilateral hip repair with the beginnings therefore will obtain an x-Chock of her hips

## 2013-04-15 NOTE — Assessment & Plan Note (Signed)
Likely degenerative disc disease. she can continue the acetaminophen. I will obtain an x-Cardella to make sure she has a compression fracture

## 2013-04-16 ENCOUNTER — Telehealth: Payer: Self-pay | Admitting: Adult Health

## 2013-04-16 MED ORDER — ATENOLOL 50 MG PO TABS: 50.0000 mg | ORAL_TABLET | Freq: Every day | ORAL | Status: DC

## 2013-04-16 NOTE — Telephone Encounter (Signed)
Medication sent via escribe.  

## 2013-04-16 NOTE — Telephone Encounter (Signed)
Received fax refill request  Rx # [id number] Medication:  Atenolol 50 mg tab Qty 30 Sig:  Take one tablet by mouth daily with 25 mg Physician:  Lyman Bishop

## 2013-05-12 ENCOUNTER — Emergency Department (HOSPITAL_COMMUNITY): Payer: Medicare Other

## 2013-05-12 ENCOUNTER — Encounter (HOSPITAL_COMMUNITY): Payer: Self-pay | Admitting: Emergency Medicine

## 2013-05-12 ENCOUNTER — Emergency Department (HOSPITAL_COMMUNITY)
Admission: EM | Admit: 2013-05-12 | Discharge: 2013-05-12 | Disposition: A | Discharge status: Home / Self Care | Payer: Medicare Other | Attending: Emergency Medicine | Admitting: Emergency Medicine

## 2013-05-12 DIAGNOSIS — Z8719 Personal history of other diseases of the digestive system: Secondary | ICD-10-CM | POA: Insufficient documentation

## 2013-05-12 DIAGNOSIS — Z85038 Personal history of other malignant neoplasm of large intestine: Secondary | ICD-10-CM | POA: Insufficient documentation

## 2013-05-12 DIAGNOSIS — Z8639 Personal history of other endocrine, nutritional and metabolic disease: Secondary | ICD-10-CM | POA: Insufficient documentation

## 2013-05-12 DIAGNOSIS — Z7982 Long term (current) use of aspirin: Secondary | ICD-10-CM | POA: Insufficient documentation

## 2013-05-12 DIAGNOSIS — M545 Low back pain, unspecified: Secondary | ICD-10-CM | POA: Insufficient documentation

## 2013-05-12 DIAGNOSIS — Z860100 Personal history of colon polyps, unspecified: Secondary | ICD-10-CM | POA: Insufficient documentation

## 2013-05-12 DIAGNOSIS — J45909 Unspecified asthma, uncomplicated: Secondary | ICD-10-CM | POA: Insufficient documentation

## 2013-05-12 DIAGNOSIS — M546 Pain in thoracic spine: Secondary | ICD-10-CM | POA: Insufficient documentation

## 2013-05-12 DIAGNOSIS — Z8614 Personal history of Methicillin resistant Staphylococcus aureus infection: Secondary | ICD-10-CM | POA: Insufficient documentation

## 2013-05-12 DIAGNOSIS — Z85828 Personal history of other malignant neoplasm of skin: Secondary | ICD-10-CM | POA: Insufficient documentation

## 2013-05-12 DIAGNOSIS — J4489 Other specified chronic obstructive pulmonary disease: Secondary | ICD-10-CM | POA: Insufficient documentation

## 2013-05-12 DIAGNOSIS — IMO0002 Reserved for concepts with insufficient information to code with codable children: Secondary | ICD-10-CM | POA: Insufficient documentation

## 2013-05-12 DIAGNOSIS — M549 Dorsalgia, unspecified: Secondary | ICD-10-CM

## 2013-05-12 DIAGNOSIS — Z79899 Other long term (current) drug therapy: Secondary | ICD-10-CM | POA: Insufficient documentation

## 2013-05-12 DIAGNOSIS — Z862 Personal history of diseases of the blood and blood-forming organs and certain disorders involving the immune mechanism: Secondary | ICD-10-CM | POA: Insufficient documentation

## 2013-05-12 DIAGNOSIS — Z87891 Personal history of nicotine dependence: Secondary | ICD-10-CM | POA: Insufficient documentation

## 2013-05-12 DIAGNOSIS — Z95818 Presence of other cardiac implants and grafts: Secondary | ICD-10-CM | POA: Insufficient documentation

## 2013-05-12 DIAGNOSIS — Z9889 Other specified postprocedural states: Secondary | ICD-10-CM | POA: Insufficient documentation

## 2013-05-12 DIAGNOSIS — J449 Chronic obstructive pulmonary disease, unspecified: Secondary | ICD-10-CM | POA: Insufficient documentation

## 2013-05-12 DIAGNOSIS — Z8781 Personal history of (healed) traumatic fracture: Secondary | ICD-10-CM | POA: Insufficient documentation

## 2013-05-12 DIAGNOSIS — I2510 Atherosclerotic heart disease of native coronary artery without angina pectoris: Secondary | ICD-10-CM | POA: Insufficient documentation

## 2013-05-12 DIAGNOSIS — I10 Essential (primary) hypertension: Secondary | ICD-10-CM | POA: Insufficient documentation

## 2013-05-12 MED ORDER — KETOROLAC TROMETHAMINE 10 MG PO TABS: 10.0000 mg | ORAL_TABLET | Freq: Two times a day (BID) | ORAL | Status: DC | PRN

## 2013-05-12 MED ORDER — KETOROLAC TROMETHAMINE 60 MG/2ML IM SOLN
60.0000 mg | Freq: Once | INTRAMUSCULAR | Status: AC
  Administered 2013-05-12: 60 mg via INTRAMUSCULAR
  Filled 2013-05-12: qty 2

## 2013-05-12 NOTE — ED Provider Notes (Signed)
CSN: [id number]     Arrival date & time 05/12/13  4098 History   This chart was scribed for Donnetta Hutching, MD, by Yevette Edwards, ED Scribe. This patient was seen in room APA15/APA15 and the patient's care was started at 10:28 AM. First MD Initiated Contact with Patient 05/12/13 1024     Chief Complaint  Patient presents with  . Back Pain   The history is provided by the patient and a relative. No language interpreter was used.   HPI Comments: CAYLAN CHENARD is a 79 y.o. female, with a h/o compression fractures, who presents to the Emergency Department complaining of diffuse back pain which has been occurring intermittently for approximately a month, but which has worsened in the past four days. Her son states her back pain is similar to the back pain she experienced several years ago when she suffered a compression fracture of vertebra. She is status post vertebroplasty for same. The pt has used tylenol for pain management. She is a former smoker. No radicular symptoms. No bowel or bladder incontinence.   Past Medical History  Diagnosis Date  . Asthma   . Coronary atherosclerosis of native coronary artery     s/p PTCA RCA (Dr. Juliann Pares, [place])  . PVD (peripheral vascular disease)   . Atrial fibrillation   . Essential hypertension, benign   . Amenia   . COPD (chronic obstructive pulmonary disease) 2008    Emphysemaous with asthmatic component-FEV 2008 34%  . Rectal bleeding   . SBO (small bowel obstruction)   . MRSA (methicillin resistant Staphylococcus aureus)   . Nontoxic multinodular goiter   . History of esophagitis     Gastritis, duodenitis. EGD neg 10/20/02  . Hypercholesterolemia   . Arteriovenous malformation of duodenum   . Leg cramps     With mild claudication  . Diverticulosis of colon   . Hx of colonic polyps   . Gall stones 10/1997  . Goiter     thyroid US- bx 06/26/03  . Femoral neck fracture     ORIF, Dr. Yisroel Ramming 02/20/08  . Compression fracture of vertebra      T4/ kyphoplasty, hosp 12/31-06/02/10  . Carcinoma of colon     s/p resection  . Skin cancer     L forearm 2012, per derm   Past Surgical History  Procedure Laterality Date  . Abdominal hysterectomy    . Hip fracture surgery    . Colon resection  05/1997    cancer and carcinoid tumor  . Ileostomy  05/1997    takedown 7/99  . Angioplasty  7/00    R CA  . Abdominal surgery      incarcerated ventral hernia, MRSA 06/28/00  . Cataract extraction  2004    bilateral  . Total abdominal hysterectomy    . Cardiac catheterization      stent/ ER, ok LE's 10/00. PV airtigraogy- stent 10/00   Family History  Problem Relation Age of Onset  . Kidney disease Mother     Died of kidney failure  . Cancer Father     Stomach   History  Substance Use Topics  . Smoking status: Former Smoker    Types: Cigarettes  . Smokeless tobacco: Never Used     Comment: quit 10-11 years ago  . Alcohol Use: No   No OB history provided.  Review of Systems  Musculoskeletal: Positive for back pain.  All other systems reviewed and are negative.   Allergies  Budesonide-formoterol fumarate; Sulfa  antibiotics; Sulfonamide derivatives; Diltiazem; and Metoprolol  Home Medications   Current Outpatient Rx  Name  Route  Sig  Dispense  Refill  . acetaminophen (TYLENOL) 500 MG tablet   Oral   Take 500 mg by mouth at bedtime as needed for pain.          Marland Kitchen aspirin 81 MG tablet   Oral   Take 81 mg by mouth every morning.          Marland Kitchen atenolol (TENORMIN) 25 MG tablet      TAKE 25MG  ALONG WITH THE 50MG  TAB TO EQUAL 75MG  ONE TIME DAILY   30 tablet   5   . atenolol (TENORMIN) 50 MG tablet   Oral   Take 1 tablet (50 mg total) by mouth daily. TAKE ONE 50MG  TABLET ALONG WITH ONE 25MG  TABLET TO EQUAL 75MG  DAILY   30 tablet   6   . Calcium 1500 MG tablet   Oral   Take 1,500 mg by mouth every morning.          . cholecalciferol (VITAMIN D) 1000 UNITS tablet   Oral   Take 1,000 Units by mouth daily.            Marland Kitchen CRANBERRY PO   Oral   Take 1 tablet by mouth 2 (two) times daily.         . Cyanocobalamin (VITAMIN B 12 PO)   Oral   Take 500 mcg by mouth daily.         . Fluticasone-Salmeterol (ADVAIR DISKUS) 250-50 MCG/DOSE AEPB   Inhalation   Inhale 1 puff into the lungs 2 (two) times daily.   60 each   3   . furosemide (LASIX) 20 MG tablet   Oral   Take 20 mg by mouth daily.         Marland Kitchen GRAPE SEED PO   Oral   Take 1 capsule by mouth 2 (two) times daily.          . IRON PO   Oral   Take 0.5 tablets by mouth daily.         . Multiple Vitamins-Minerals (CENTRUM SILVER PO)   Oral   Take 1 tablet by mouth daily.          . nitroGLYCERIN (NITROSTAT) 0.4 MG SL tablet      Place one under tongue every 5 minutes x 3 as needed   25 tablet   1   . Omega-3 Fatty Acids (FISH OIL) 1000 MG CAPS   Oral   Take 1 capsule by mouth 2 (two) times daily.          Marland Kitchen omeprazole (PRILOSEC) 20 MG capsule   Oral   Take 20 mg by mouth every morning.          . potassium chloride (K-DUR,KLOR-CON) 10 MEQ tablet   Oral   Take 1 tablet (10 mEq total) by mouth daily.   30 tablet   3    Triage Vitals: BP 140/66  Pulse 97  Temp(Src) 97.9 F (36.6 C)  Resp 18  SpO2 90%  Physical Exam  Nursing note and vitals reviewed. Constitutional: She is oriented to person, place, and time. She appears well-developed and well-nourished.  HENT:  Head: Normocephalic and atraumatic.  Eyes: Conjunctivae and EOM are normal. Pupils are equal, round, and reactive to light.  Neck: Normal range of motion. Neck supple.  Cardiovascular: Normal rate, regular rhythm and normal heart sounds.   Pulmonary/Chest:  Effort normal and breath sounds normal.  Abdominal: Soft. Bowel sounds are normal.  Musculoskeletal: She exhibits tenderness.  Minimal tenderness to upper and lower back.   Neurological: She is alert and oriented to person, place, and time.  Skin: Skin is warm and dry.  Psychiatric: She  has a normal mood and affect. Her behavior is normal.   ED Course  Procedures (including critical care time)  DIAGNOSTIC STUDIES: Oxygen Saturation is 90% on room air, low  by my interpretation.    COORDINATION OF CARE:  10:33AM- Discussed treatment plan with patient, which includes imaging and pain medication, and the patient agreed to the plan.   Labs Review Labs Reviewed - No data to display I maging Review Dg Thoracic Spine W/swimmers  05/12/2013   CLINICAL DATA:  Upper back pain.  EXAM: THORACIC SPINE - 2 VIEW + SWIMMERS  COMPARISON:  09/10/2012  FINDINGS: Prior upper thoracic vertebroplasty, stable. Diffuse osteopenia. Mild generalized kyphosis. No acute fracture. No subluxation.  Atherosclerotic calcifications throughout the thoracic aorta.  IMPRESSION: No acute bony abnormality.  Osteopenia.   Electronically Signed   By: Charlett Nose M.D.   On: 05/12/2013 11:44   Dg Lumbar Spine Complete  05/12/2013   CLINICAL DATA:  Low back pain.  EXAM: LUMBAR SPINE - COMPLETE 4+ VIEW  COMPARISON:  04/14/2013  FINDINGS: Diffuse osteopenia. 6 mm of anterolisthesis of L4 on L5 likely related to facet disease. Degenerative disc disease at L5-S1 with disc space narrowing. Degenerative facet disease at this level as well. No fracture. SI joints are symmetric and unremarkable.  Atherosclerotic calcifications throughout the aorta. Bilateral iliac stents noted.  IMPRESSION: Diffuse osteopenia. Degenerative disc and facet disease in the lower lumbar spine.  No acute findings.   Electronically Signed   By: Charlett Nose M.D.   On: 05/12/2013 11:45    EKG Interpretation   None       MDM  No diagnosis found. Plain films of thoracic and lumbar spine show no fractures, but diffuse osteopenia. Also degenerative disc disease in the lower spine. Rx by mouth Toradol. Patient will followup with primary care Dr.  I personally performed the services described in this documentation, which was scribed in my  presence. The recorded information has been reviewed and is accurate.     Donnetta Hutching, MD 05/12/13 [phone]

## 2013-05-12 NOTE — ED Notes (Signed)
Ambulated to br with assistance

## 2013-05-12 NOTE — ED Notes (Signed)
Pt. Reports lower back pain starting 3 weeks ago, pt went to primary care and had x-rays that showed arthritis. Pt. Reports hx of spinal fusion 3 years ago. Pt. Reports increased sharp lower back pain over weekend.

## 2013-05-14 ENCOUNTER — Ambulatory Visit (INDEPENDENT_AMBULATORY_CARE_PROVIDER_SITE_OTHER): Payer: Medicare Other | Admitting: Family Medicine

## 2013-05-14 ENCOUNTER — Telehealth: Payer: Self-pay | Admitting: Family Medicine

## 2013-05-14 VITALS — BP 122/78 | HR 78 | Temp 97.7°F | Resp 20 | Wt 123.5 lb

## 2013-05-14 DIAGNOSIS — M549 Dorsalgia, unspecified: Secondary | ICD-10-CM

## 2013-05-14 DIAGNOSIS — M5137 Other intervertebral disc degeneration, lumbosacral region: Secondary | ICD-10-CM

## 2013-05-14 DIAGNOSIS — M5136 Other intervertebral disc degeneration, lumbar region: Secondary | ICD-10-CM

## 2013-05-14 LAB — URINALYSIS, ROUTINE W REFLEX MICROSCOPIC
Leukocytes, UA: NEGATIVE
Nitrite: NEGATIVE
Protein, ur: 30 mg/dL — AB
Specific Gravity, Urine: 1.025 (ref 1.005–1.030)
Urobilinogen, UA: 1 mg/dL (ref 0.0–1.0)
pH: 5.5 (ref 5.0–8.0)

## 2013-05-14 LAB — URINALYSIS, MICROSCOPIC ONLY

## 2013-05-14 MED ORDER — TRAMADOL HCL 50 MG PO TABS: 50.0000 mg | ORAL_TABLET | Freq: Two times a day (BID) | ORAL | Status: DC | PRN

## 2013-05-14 NOTE — Telephone Encounter (Signed)
Pt coming in today at 12:30 

## 2013-05-14 NOTE — Progress Notes (Signed)
   Subjective:    Patient ID: Abigail [name], female    DOB: March 07, 1920, 79 y.o.   MRN: [id number]  HPI  The patient presents with worsening low back pain over the past month. She was actually seen in the emergency room 2 days ago secondary to worsening back pain she was given a shot of Toradol and sent home with Toradol tablets which have not helped. Her back pain continues and she is worried about another compression fracture. She did have repeat x-rays but there was no evidence of acute fracture. Her pain is mostly below the previous site of the thoracic compression fracture which she had back in 2012. She's also had some weakness in her left leg. She's not had any change in bowel or bladder. She is finding it difficult to walk due to the back pain.  Review of Systems  GEN- denies fatigue, fever, weight loss,weakness, recent illness HEENT- denies eye drainage, change in vision, nasal discharge, CVS- denies chest pain, palpitations RESP- denies SOB, cough, wheeze ABD- denies N/V, change in stools, abd pain GU- denies dysuria, hematuria, dribbling, incontinence MSK- + joint pain, muscle aches, injury Neuro- denies headache, dizziness, syncope, seizure activity      Objective:   Physical Exam GEN- NAD, alert and oriented x3 HEENT- PERRL, EOMI, non injected sclera, pink conjunctiva, MMM, oropharynx clear CVS- irregular rhythm, normal rate, no murmur RESP-CTAB ABD-NABS,soft, NT, ND, no CVA tenderness EXT- +pedal  Edema MSK- Back- +TTP lower lumbar spine, TTP over Right SI joint,  decreased ROM in back and hips Neuro- decreased strength LLE, antalgic gait Pulses- Radial, DP- 2+      Assessment & Plan:

## 2013-05-14 NOTE — Assessment & Plan Note (Signed)
She is known degenerative changes in her lumbar spine. She's also had a history of compression fracture.

## 2013-05-14 NOTE — Patient Instructions (Signed)
Take Tylenol Extra Strength twice a day  Take pain pill as needed We will set up MRI of back  F/U as needed

## 2013-05-14 NOTE — Telephone Encounter (Signed)
Pt is calling today because she is wanting to know the name of the dr or drs that helped with her back a year ago or so because it is giving her problems again. She said something about being in bottom part of [place] Call back number is [phone]

## 2013-05-14 NOTE — Assessment & Plan Note (Addendum)
I concern about the worsening of her back pain. I will like to rule out spinal stenosis versus disc bulge with nerve root irritation. I will provide her with tramadol today and she will also take Tylenol twice a day. I do not see a compression fracture the x-rays however it does could be missed on plain films. I did discuss this with the patient and her son and they want to proceed with imaging and treatment.  UA also done to r/o infection

## 2013-05-16 ENCOUNTER — Encounter (HOSPITAL_COMMUNITY): Payer: Self-pay

## 2013-05-16 ENCOUNTER — Ambulatory Visit (HOSPITAL_COMMUNITY)
Admission: RE | Admit: 2013-05-16 | Discharge: 2013-05-16 | Disposition: A | Discharge status: Home / Self Care | Payer: Medicare Other | Source: Ambulatory Visit | Attending: Family Medicine | Admitting: Family Medicine

## 2013-05-16 DIAGNOSIS — M48061 Spinal stenosis, lumbar region without neurogenic claudication: Secondary | ICD-10-CM | POA: Insufficient documentation

## 2013-05-16 DIAGNOSIS — M51379 Other intervertebral disc degeneration, lumbosacral region without mention of lumbar back pain or lower extremity pain: Secondary | ICD-10-CM | POA: Insufficient documentation

## 2013-05-16 DIAGNOSIS — M549 Dorsalgia, unspecified: Secondary | ICD-10-CM

## 2013-05-16 DIAGNOSIS — M5136 Other intervertebral disc degeneration, lumbar region: Secondary | ICD-10-CM

## 2013-05-16 DIAGNOSIS — M545 Low back pain, unspecified: Secondary | ICD-10-CM | POA: Insufficient documentation

## 2013-05-16 DIAGNOSIS — M5137 Other intervertebral disc degeneration, lumbosacral region: Secondary | ICD-10-CM | POA: Insufficient documentation

## 2013-05-16 DIAGNOSIS — M5126 Other intervertebral disc displacement, lumbar region: Secondary | ICD-10-CM | POA: Insufficient documentation

## 2013-05-16 DIAGNOSIS — G589 Mononeuropathy, unspecified: Secondary | ICD-10-CM | POA: Insufficient documentation

## 2013-05-16 DIAGNOSIS — M47817 Spondylosis without myelopathy or radiculopathy, lumbosacral region: Secondary | ICD-10-CM | POA: Insufficient documentation

## 2013-05-16 DIAGNOSIS — M51369 Other intervertebral disc degeneration, lumbar region without mention of lumbar back pain or lower extremity pain: Secondary | ICD-10-CM

## 2013-05-18 ENCOUNTER — Other Ambulatory Visit: Payer: Self-pay | Admitting: Family Medicine

## 2013-05-18 DIAGNOSIS — M7138 Other bursal cyst, other site: Secondary | ICD-10-CM

## 2013-05-18 DIAGNOSIS — M48061 Spinal stenosis, lumbar region without neurogenic claudication: Secondary | ICD-10-CM

## 2013-05-18 DIAGNOSIS — M5126 Other intervertebral disc displacement, lumbar region: Secondary | ICD-10-CM

## 2013-05-18 DIAGNOSIS — M5136 Other intervertebral disc degeneration, lumbar region: Secondary | ICD-10-CM

## 2013-05-19 ENCOUNTER — Ambulatory Visit (HOSPITAL_COMMUNITY): Payer: Medicare Other

## 2013-05-23 ENCOUNTER — Encounter (HOSPITAL_COMMUNITY): Payer: Self-pay | Admitting: Emergency Medicine

## 2013-05-23 DIAGNOSIS — IMO0002 Reserved for concepts with insufficient information to code with codable children: Secondary | ICD-10-CM | POA: Insufficient documentation

## 2013-05-23 DIAGNOSIS — J4489 Other specified chronic obstructive pulmonary disease: Secondary | ICD-10-CM | POA: Insufficient documentation

## 2013-05-23 DIAGNOSIS — Z860100 Personal history of colon polyps, unspecified: Secondary | ICD-10-CM | POA: Insufficient documentation

## 2013-05-23 DIAGNOSIS — J449 Chronic obstructive pulmonary disease, unspecified: Secondary | ICD-10-CM | POA: Insufficient documentation

## 2013-05-23 DIAGNOSIS — Q2733 Arteriovenous malformation of digestive system vessel: Secondary | ICD-10-CM | POA: Insufficient documentation

## 2013-05-23 DIAGNOSIS — Z859 Personal history of malignant neoplasm, unspecified: Secondary | ICD-10-CM | POA: Insufficient documentation

## 2013-05-23 DIAGNOSIS — Z8719 Personal history of other diseases of the digestive system: Secondary | ICD-10-CM | POA: Insufficient documentation

## 2013-05-23 DIAGNOSIS — Z87891 Personal history of nicotine dependence: Secondary | ICD-10-CM | POA: Insufficient documentation

## 2013-05-23 DIAGNOSIS — I714 Abdominal aortic aneurysm, without rupture, unspecified: Secondary | ICD-10-CM | POA: Insufficient documentation

## 2013-05-23 DIAGNOSIS — Z8742 Personal history of other diseases of the female genital tract: Secondary | ICD-10-CM | POA: Insufficient documentation

## 2013-05-23 DIAGNOSIS — M545 Low back pain, unspecified: Secondary | ICD-10-CM | POA: Insufficient documentation

## 2013-05-23 DIAGNOSIS — E7800 Pure hypercholesterolemia, unspecified: Secondary | ICD-10-CM | POA: Insufficient documentation

## 2013-05-23 DIAGNOSIS — M48061 Spinal stenosis, lumbar region without neurogenic claudication: Secondary | ICD-10-CM | POA: Insufficient documentation

## 2013-05-23 DIAGNOSIS — Z7982 Long term (current) use of aspirin: Secondary | ICD-10-CM | POA: Insufficient documentation

## 2013-05-23 DIAGNOSIS — Z8781 Personal history of (healed) traumatic fracture: Secondary | ICD-10-CM | POA: Insufficient documentation

## 2013-05-23 DIAGNOSIS — I2510 Atherosclerotic heart disease of native coronary artery without angina pectoris: Secondary | ICD-10-CM | POA: Insufficient documentation

## 2013-05-23 DIAGNOSIS — Z9889 Other specified postprocedural states: Secondary | ICD-10-CM | POA: Insufficient documentation

## 2013-05-23 DIAGNOSIS — I10 Essential (primary) hypertension: Secondary | ICD-10-CM | POA: Insufficient documentation

## 2013-05-23 DIAGNOSIS — Z90710 Acquired absence of both cervix and uterus: Secondary | ICD-10-CM | POA: Insufficient documentation

## 2013-05-23 DIAGNOSIS — I4891 Unspecified atrial fibrillation: Secondary | ICD-10-CM | POA: Insufficient documentation

## 2013-05-23 DIAGNOSIS — Z8614 Personal history of Methicillin resistant Staphylococcus aureus infection: Secondary | ICD-10-CM | POA: Insufficient documentation

## 2013-05-23 DIAGNOSIS — Z79899 Other long term (current) drug therapy: Secondary | ICD-10-CM | POA: Insufficient documentation

## 2013-05-23 DIAGNOSIS — Z85828 Personal history of other malignant neoplasm of skin: Secondary | ICD-10-CM | POA: Insufficient documentation

## 2013-05-23 DIAGNOSIS — Z95818 Presence of other cardiac implants and grafts: Secondary | ICD-10-CM | POA: Insufficient documentation

## 2013-05-23 NOTE — ED Notes (Signed)
Pt having increasing back pain over the past week. sts recently dx with back problems. sts lower back.

## 2013-05-24 ENCOUNTER — Emergency Department (HOSPITAL_COMMUNITY)
Admission: EM | Admit: 2013-05-24 | Discharge: 2013-05-24 | Disposition: A | Discharge status: Home / Self Care | Payer: Medicare Other | Attending: Emergency Medicine | Admitting: Emergency Medicine

## 2013-05-24 ENCOUNTER — Emergency Department (HOSPITAL_COMMUNITY): Payer: Medicare Other

## 2013-05-24 DIAGNOSIS — I714 Abdominal aortic aneurysm, without rupture, unspecified: Secondary | ICD-10-CM

## 2013-05-24 DIAGNOSIS — M549 Dorsalgia, unspecified: Secondary | ICD-10-CM

## 2013-05-24 LAB — CBC WITH DIFFERENTIAL/PLATELET
Basophils Absolute: 0 10*3/uL (ref 0.0–0.1)
Basophils Relative: 0 % (ref 0–1)
Eosinophils Relative: 1 % (ref 0–5)
HCT: 41.6 % (ref 36.0–46.0)
Lymphocytes Relative: 21 % (ref 12–46)
MCHC: 34.9 g/dL (ref 30.0–36.0)
Monocytes Absolute: 1.2 10*3/uL — ABNORMAL HIGH (ref 0.1–1.0)
Monocytes Relative: 16 % — ABNORMAL HIGH (ref 3–12)
Neutro Abs: 4.9 10*3/uL (ref 1.7–7.7)
Platelets: 125 10*3/uL — ABNORMAL LOW (ref 150–400)
RBC: 4.02 MIL/uL (ref 3.87–5.11)
RDW: 13.1 % (ref 11.5–15.5)
WBC: 7.9 10*3/uL (ref 4.0–10.5)

## 2013-05-24 LAB — URINALYSIS, ROUTINE W REFLEX MICROSCOPIC
Hgb urine dipstick: NEGATIVE
Ketones, ur: NEGATIVE mg/dL
Nitrite: NEGATIVE
Protein, ur: NEGATIVE mg/dL
Specific Gravity, Urine: 1.022 (ref 1.005–1.030)
Urobilinogen, UA: 1 mg/dL (ref 0.0–1.0)

## 2013-05-24 LAB — COMPREHENSIVE METABOLIC PANEL
ALT: 11 U/L (ref 0–35)
Alkaline Phosphatase: 70 U/L (ref 39–117)
CO2: 34 mEq/L — ABNORMAL HIGH (ref 19–32)
Chloride: 94 mEq/L — ABNORMAL LOW (ref 96–112)
Creatinine, Ser: 1.22 mg/dL — ABNORMAL HIGH (ref 0.50–1.10)
GFR calc Af Amer: 44 mL/min — ABNORMAL LOW (ref >90)
Glucose, Bld: 98 mg/dL (ref 70–99)
Potassium: 3.7 mEq/L (ref 3.5–5.1)
Sodium: 135 mEq/L (ref 135–145)
Total Bilirubin: 1.2 mg/dL (ref 0.3–1.2)
Total Protein: 6.5 g/dL (ref 6.0–8.3)

## 2013-05-24 MED ORDER — HYDROCODONE-ACETAMINOPHEN 5-325 MG PO TABS: 2.0000 | ORAL_TABLET | ORAL | Status: DC | PRN

## 2013-05-24 MED ORDER — HYDROCODONE-ACETAMINOPHEN 5-325 MG PO TABS
2.0000 | ORAL_TABLET | Freq: Once | ORAL | Status: AC
  Administered 2013-05-24: 2 via ORAL
  Filled 2013-05-24: qty 2

## 2013-05-24 NOTE — ED Provider Notes (Signed)
CSN: [id number]     Arrival date & time 05/23/13  1814 History   First MD Initiated Contact with Patient 05/24/13 0141     Chief Complaint  Patient presents with  . Back Pain   (Consider location/radiation/quality/duration/timing/severity/associated sxs/prior Treatment) HPI Comments: 79 year old female with a history of low back pain and a recent diagnosis of moderate spinal stenosis at L4-L5. She presents with a complaint of lower back pain which seems to be more at the right lower back. She notes that this has been a persistent for the last 2 weeks, she has had ambulate with a cane but has been doing this successfully without weakness or numbness of her legs and no urinary incontinence or retention. Over the last couple of days she has felt that the pain sometimes radiates to underneath her left shoulder blade. She denies abdominal pain, fever, chills, nausea, vomiting. She has been taking her pain medication tramadol which does seem to help temporarily with the pain. She is followed by Dr. Jeanice Lim and has currently been referred to a spinal specialist though she does not currently have a appointment.  Patient is a 79 y.o. female presenting with back pain. The history is provided by the patient, a relative and medical records.  Back Pain   Past Medical History  Diagnosis Date  . Asthma   . Coronary atherosclerosis of native coronary artery     s/p PTCA RCA (Dr. Juliann Pares, [place])  . PVD (peripheral vascular disease)   . Atrial fibrillation   . Essential hypertension, benign   . Amenia   . COPD (chronic obstructive pulmonary disease) 2008    Emphysemaous with asthmatic component-FEV 2008 34%  . Rectal bleeding   . SBO (small bowel obstruction)   . MRSA (methicillin resistant Staphylococcus aureus)   . Nontoxic multinodular goiter   . History of esophagitis     Gastritis, duodenitis. EGD neg 10/20/02  . Hypercholesterolemia   . Arteriovenous malformation of duodenum   . Leg cramps      With mild claudication  . Diverticulosis of colon   . Hx of colonic polyps   . Gall stones 10/1997  . Goiter     thyroid US- bx 06/26/03  . Femoral neck fracture     ORIF, Dr. Yisroel Ramming 02/20/08  . Compression fracture of vertebra     T4/ kyphoplasty, hosp 12/31-06/02/10  . Carcinoma of colon     s/p resection  . Skin cancer     L forearm 2012, per derm   Past Surgical History  Procedure Laterality Date  . Abdominal hysterectomy    . Hip fracture surgery    . Colon resection  05/1997    cancer and carcinoid tumor  . Ileostomy  05/1997    takedown 7/99  . Angioplasty  7/00    R CA  . Abdominal surgery      incarcerated ventral hernia, MRSA 06/28/00  . Cataract extraction  2004    bilateral  . Total abdominal hysterectomy    . Cardiac catheterization      stent/ ER, ok LE's 10/00. PV airtigraogy- stent 10/00   Family History  Problem Relation Age of Onset  . Kidney disease Mother     Died of kidney failure  . Cancer Father     Stomach   History  Substance Use Topics  . Smoking status: Former Smoker    Types: Cigarettes  . Smokeless tobacco: Never Used     Comment: quit 10-11 years ago  .  Alcohol Use: No   OB History   Grav Para Term Preterm Abortions TAB SAB Ect Mult Living                 Review of Systems  Musculoskeletal: Positive for back pain.  All other systems reviewed and are negative.    Allergies  Budesonide-formoterol fumarate; Sulfa antibiotics; Sulfonamide derivatives; Diltiazem; and Metoprolol  Home Medications   Current Outpatient Rx  Name  Route  Sig  Dispense  Refill  . acetaminophen (TYLENOL) 500 MG tablet   Oral   Take 500 mg by mouth at bedtime as needed for pain.          Marland Kitchen aspirin 81 MG tablet   Oral   Take 81 mg by mouth every morning.          Marland Kitchen atenolol (TENORMIN) 25 MG tablet      TAKE 25MG  ALONG WITH THE 50MG  TAB TO EQUAL 75MG  ONE TIME DAILY   30 tablet   5   . atenolol (TENORMIN) 50 MG tablet   Oral   Take 1  tablet (50 mg total) by mouth daily. TAKE ONE 50MG  TABLET ALONG WITH ONE 25MG  TABLET TO EQUAL 75MG  DAILY   30 tablet   6   . Calcium 1500 MG tablet   Oral   Take 1,500 mg by mouth every morning.          . cholecalciferol (VITAMIN D) 1000 UNITS tablet   Oral   Take 1,000 Units by mouth daily.           Marland Kitchen CRANBERRY PO   Oral   Take 1 tablet by mouth 2 (two) times daily.         . Cyanocobalamin (VITAMIN B 12 PO)   Oral   Take 500 mcg by mouth daily.         . Fluticasone-Salmeterol (ADVAIR DISKUS) 250-50 MCG/DOSE AEPB   Inhalation   Inhale 1 puff into the lungs 2 (two) times daily.   60 each   3   . furosemide (LASIX) 20 MG tablet   Oral   Take 20 mg by mouth daily.         Marland Kitchen GRAPE SEED PO   Oral   Take 1 capsule by mouth 2 (two) times daily.          . IRON PO   Oral   Take 0.5 tablets by mouth daily.         . Multiple Vitamins-Minerals (CENTRUM SILVER PO)   Oral   Take 1 tablet by mouth daily.          . Omega-3 Fatty Acids (FISH OIL) 1000 MG CAPS   Oral   Take 1 capsule by mouth 2 (two) times daily.          Marland Kitchen omeprazole (PRILOSEC) 20 MG capsule   Oral   Take 20 mg by mouth every morning.          . potassium chloride (K-DUR,KLOR-CON) 10 MEQ tablet   Oral   Take 1 tablet (10 mEq total) by mouth daily.   30 tablet   3   . traMADol (ULTRAM) 50 MG tablet   Oral   Take 1 tablet (50 mg total) by mouth 2 (two) times daily as needed.   45 tablet   1   . HYDROcodone-acetaminophen (NORCO/VICODIN) 5-325 MG per tablet   Oral   Take 2 tablets by mouth every 4 (four) hours as needed.  10 tablet   0   . nitroGLYCERIN (NITROSTAT) 0.4 MG SL tablet      Place one under tongue every 5 minutes x 3 as needed   25 tablet   1    BP 105/58  Pulse 85  Temp(Src) 97.9 F (36.6 C) (Oral)  Resp 17  SpO2 97% Physical Exam  Nursing note and vitals reviewed. Constitutional: She appears well-developed and well-nourished. No distress.  HENT:   Head: Normocephalic and atraumatic.  Mouth/Throat: Oropharynx is clear and moist. No oropharyngeal exudate.  Eyes: Conjunctivae and EOM are normal. Pupils are equal, round, and reactive to light. Right eye exhibits no discharge. Left eye exhibits no discharge. No scleral icterus.  Neck: Normal range of motion. Neck supple. No JVD present. No thyromegaly present.  Cardiovascular: Normal rate, regular rhythm, normal heart sounds and intact distal pulses.  Exam reveals no gallop and no friction rub.   No murmur heard. Pulmonary/Chest: Effort normal and breath sounds normal. No respiratory distress. She has no wheezes. She has no rales.  Abdominal: Soft. Bowel sounds are normal. She exhibits no distension and no mass. There is no tenderness.  No palpable abdominal masses or pulsating mass, no tenderness  Musculoskeletal: Normal range of motion. She exhibits tenderness (mild tenderness over the right sacroiliac joint). She exhibits no edema.  Strength is 5 out of 5 at the bilateral hips knees and ankles in all major muscle groups.  Lymphadenopathy:    She has no cervical adenopathy.  Neurological: She is alert. Coordination normal.  Bilateral lower extremity sensation and motor is normal, reflexes are preserved at the patellar tendons bilaterally, no radiculopathies of L4 or L5 bilaterally  Skin: Skin is warm and dry. No rash noted. No erythema.  Psychiatric: She has a normal mood and affect. Her behavior is normal.    ED Course  Procedures (including critical care time) Labs Review Labs Reviewed  COMPREHENSIVE METABOLIC PANEL - Abnormal; Notable for the following:    Chloride 94 (*)    CO2 34 (*)    Creatinine, Ser 1.22 (*)    Albumin 3.0 (*)    GFR calc non Af Amer 38 (*)    GFR calc Af Amer 44 (*)    All other components within normal limits  CBC WITH DIFFERENTIAL - Abnormal; Notable for the following:    MCV 103.5 (*)    MCH 36.1 (*)    Platelets 125 (*)    Monocytes Relative 16  (*)    Monocytes Absolute 1.2 (*)    All other components within normal limits  URINALYSIS, ROUTINE W REFLEX MICROSCOPIC - Abnormal; Notable for the following:    Color, Urine AMBER (*)    Bilirubin Urine SMALL (*)    All other components within normal limits   Imaging Review US Abdomen Complete  05/24/2013   CLINICAL DATA:  Lower back pain.  EXAM: ULTRASOUND ABDOMEN COMPLETE  COMPARISON:  MRI of the lumbar spine performed 09/14/2012  FINDINGS: Gallbladder:  Status post cholecystectomy.  No retained stones seen.  Common bile duct:  Diameter: 0.5 cm, within normal limits in caliber.  Liver:  Mildly nodular contour, with coarsened echotexture. Findings raise concern for mild cirrhosis. No dominant mass seen.  IVC:  No abnormality visualized.  Pancreas:  Diffuse fatty infiltration noted within the pancreas; the visualized portions of the pancreas are otherwise grossly unremarkable.  Spleen:  Size and appearance within normal limits.  Right Kidney:  Length: 9.8 cm. Echogenicity within normal limits. No  mass or hydronephrosis visualized.  Left Kidney:  Length: 11.1 cm. Echogenicity within normal limits. No mass or hydronephrosis visualized.  Abdominal aorta:  Mild aneurysmal dilatation of the mid to distal abdominal aorta measuring up to 3.3 cm in AP dimension. There is diffuse calcification and mural thrombus within the abdominal aorta, without significant luminal narrowing. The patient's aortoiliac stent graft is not well characterized, though it appears grossly patent.  Other findings:  None.  IMPRESSION: 1. Mild aneurysmal dilatation of the mid to distal abdominal aorta, measuring up to 3.3 cm in AP dimension. Associated diffuse calcification and mural thrombus, without significant luminal narrowing. Known aortoiliac stent graft is not well characterized, though it appears grossly patent. 2. Slightly nodular appearance of the liver raises concern for mild cirrhosis. 3. Status post cholecystectomy; no  retained stones seen.   Electronically Signed   By: Roanna Raider M.D.   On: 05/24/2013 04:52    EKG Interpretation   None       MDM   1. Back pain   2. AAA (abdominal aortic aneurysm)    The patient appears to have normal neurologic exam at baseline for her age. Her pain is persistent and worsening but a recent MRI shows no signs of cancer, abscess, or severe spinal cord disease inpatient or metastatic disease. At this time I will look for other sources of pain that could cause back pain with radiation to the shoulder including checking liver function as well as checking for abdominal aneurysm. Pain medication ordered.  Ultrasound shows very small abdominal aortic aneurysm, the patient has no abdominal tenderness, I doubt that this is a contributing problem to her back pain. Urinalysis reveals no signs of infection, lab work is unremarkable, patient is stable for discharge.  Meds given in ED:  Medications  HYDROcodone-acetaminophen (NORCO/VICODIN) 5-325 MG per tablet 2 tablet (2 tablets Oral Given 05/24/13 0158)    New Prescriptions   HYDROCODONE-ACETAMINOPHEN (NORCO/VICODIN) 5-325 MG PER TABLET    Take 2 tablets by mouth every 4 (four) hours as needed.      Vida Roller, MD 05/24/13 [phone]

## 2013-05-24 NOTE — ED Notes (Signed)
Pt returned from u/s

## 2013-05-24 NOTE — ED Notes (Signed)
Pt dc to home.  Pt sts understanding to dc instructions.  Pt taken to car via w/c.   

## 2013-05-24 NOTE — ED Notes (Signed)
Pt taken to br via w/c for collection of urine sample. Sample sent to lab.

## 2013-05-27 ENCOUNTER — Ambulatory Visit (INDEPENDENT_AMBULATORY_CARE_PROVIDER_SITE_OTHER): Payer: Medicare Other | Admitting: Family Medicine

## 2013-05-27 ENCOUNTER — Encounter: Payer: Self-pay | Admitting: Family Medicine

## 2013-05-27 VITALS — BP 110/78 | HR 100 | Temp 97.7°F | Resp 18 | Wt 122.0 lb

## 2013-05-27 DIAGNOSIS — M51379 Other intervertebral disc degeneration, lumbosacral region without mention of lumbar back pain or lower extremity pain: Secondary | ICD-10-CM

## 2013-05-27 DIAGNOSIS — J209 Acute bronchitis, unspecified: Secondary | ICD-10-CM

## 2013-05-27 DIAGNOSIS — M5136 Other intervertebral disc degeneration, lumbar region: Secondary | ICD-10-CM

## 2013-05-27 DIAGNOSIS — R509 Fever, unspecified: Secondary | ICD-10-CM

## 2013-05-27 DIAGNOSIS — M5137 Other intervertebral disc degeneration, lumbosacral region: Secondary | ICD-10-CM

## 2013-05-27 MED ORDER — PREDNISONE 20 MG PO TABS: ORAL_TABLET | ORAL | Status: DC

## 2013-05-27 MED ORDER — LEVALBUTEROL TARTRATE 45 MCG/ACT IN AERO: 2.0000 | INHALATION_SPRAY | Freq: Four times a day (QID) | RESPIRATORY_TRACT | Status: DC | PRN

## 2013-05-27 MED ORDER — AZITHROMYCIN 250 MG PO TABS: ORAL_TABLET | ORAL | Status: DC

## 2013-05-27 MED ORDER — HYDROCODONE-ACETAMINOPHEN 5-325 MG PO TABS: 1.0000 | ORAL_TABLET | ORAL | Status: DC | PRN

## 2013-05-27 NOTE — Patient Instructions (Addendum)
Take prednisone this evening  Start antibiotics Continue oxygen Use the inhaler -xopenex as needed Call to reschedule her appointment [phone]  (Dr. Eduard Clos Spine Specialist) F/U as previous

## 2013-05-28 DIAGNOSIS — J209 Acute bronchitis, unspecified: Secondary | ICD-10-CM | POA: Insufficient documentation

## 2013-05-28 NOTE — Progress Notes (Signed)
   Subjective:    Patient ID: Abigail [name], female    DOB: [birth year], 79 y.o.   MRN: [id number]  HPI Patient here with cough with production and wheezing and increased shortness of breath for the past 2 days. Low grade fever yesterday. She does have a history of underlying COPD which she uses Advair for. She's been wearing her oxygen throughout the day as well because of the shortness of breath which helps. Positive sick contacts as she was sitting at the hospital visiting some friends . She's not used any over-the-counter medications. She does not have rescue inhaler at home currently  Back pain-continues to have significant back pain she went back to the ER again he did have a CT scan to look for any abdominal pathology but there is nothing acute. She does have a mild aortic aneurysm. She was given a prescription for hydrocodone her son has been given her 5 mg hydrocodone which he had at home which helps. She's been staying in [place] with him. Unfortunately she missed her appointment with the spine specialist which was yesterday -her son states that his bone was not working yesterday   Review of Systems  GEN-+fatigue, +fever, weight loss,weakness, recent illness HEENT- denies eye drainage, change in vision, nasal discharge, CVS- denies chest pain, palpitations RESP- + SOB,+ cough, +wheeze ABD- denies N/V, change in stools, abd pain GU- denies dysuria, hematuria, dribbling, incontinence MSK- + joint pain, muscle aches, injury Neuro- denies headache, dizziness, syncope, seizure activity      Objective:   Physical Exam   GEN- NAD, alert and oriented x3 HEENT- PERRL, EOMI, non injected sclera, pink conjunctiva, MMM, oropharynx clear CVS- irregular rhythm, normal rate, no murmur RESP-few scattered wheeze, normal WOB, oxygen sat 93% 2L, mild rhonchi clears with cough ABD-NABS,soft, NT, ND,  EXT- +pedal  Edema Pulses- Radial 2+  Flu Negative    Assessment & Plan:

## 2013-05-28 NOTE — Assessment & Plan Note (Signed)
Accordingly she missed her appointment with the neurosurgeon. I've refilled her pain medication today the prednisone burst may also help some. Her appointment will be rescheduled

## 2013-05-28 NOTE — Assessment & Plan Note (Signed)
She has mild COPD. I think this is a bronchitis flare. Have given her a dose of prednisone and she'll take antibiotics. Also given her a Xopenex inhaler as this will not raise her heart rate is much as the albuterol. She can also take Robitussin over-the-counter. If she decompensates I will obtain chest x-Friar appear

## 2013-06-09 ENCOUNTER — Other Ambulatory Visit: Payer: Self-pay | Admitting: Family Medicine

## 2013-06-09 ENCOUNTER — Telehealth: Payer: Self-pay | Admitting: Family Medicine

## 2013-06-09 ENCOUNTER — Ambulatory Visit (INDEPENDENT_AMBULATORY_CARE_PROVIDER_SITE_OTHER): Payer: Medicare Other | Admitting: Family Medicine

## 2013-06-09 ENCOUNTER — Encounter: Payer: Self-pay | Admitting: Family Medicine

## 2013-06-09 ENCOUNTER — Ambulatory Visit (HOSPITAL_COMMUNITY)
Admission: RE | Admit: 2013-06-09 | Discharge: 2013-06-09 | Disposition: A | Discharge status: Home / Self Care | Payer: Medicare Other | Source: Ambulatory Visit | Attending: Family Medicine | Admitting: Family Medicine

## 2013-06-09 VITALS — BP 100/68 | HR 100 | Temp 98.0°F | Resp 18 | Wt 121.5 lb

## 2013-06-09 DIAGNOSIS — J209 Acute bronchitis, unspecified: Secondary | ICD-10-CM

## 2013-06-09 DIAGNOSIS — J40 Bronchitis, not specified as acute or chronic: Secondary | ICD-10-CM | POA: Insufficient documentation

## 2013-06-09 DIAGNOSIS — J90 Pleural effusion, not elsewhere classified: Secondary | ICD-10-CM

## 2013-06-09 DIAGNOSIS — J4489 Other specified chronic obstructive pulmonary disease: Secondary | ICD-10-CM | POA: Insufficient documentation

## 2013-06-09 DIAGNOSIS — J449 Chronic obstructive pulmonary disease, unspecified: Secondary | ICD-10-CM | POA: Insufficient documentation

## 2013-06-09 MED ORDER — LEVOFLOXACIN 500 MG PO TABS: 500.0000 mg | ORAL_TABLET | Freq: Every day | ORAL | Status: DC

## 2013-06-09 NOTE — Telephone Encounter (Signed)
Pt is coming in for appt today

## 2013-06-09 NOTE — Patient Instructions (Signed)
Continue the robitussin Use the xopenexl inhaler twice a day for the next week Take the new antibiotics Restart the lasix 20mg  once a day  F/U as previous

## 2013-06-09 NOTE — Progress Notes (Signed)
   Subjective:    Patient ID: Abigail [name], female    DOB: Mar 23, 1920, 80 y.o.   MRN: [id number]  HPI  Pt here with residual cough with production and intermittent wheeze/ SOB. Treated for bronchitis 2 weeks ago, her wheezing has improved and she has very little, but cough is still productive of thick greening sputum. SHe completed zpak at last visit. CXR today shows small pleural effusions and acute bronchitis, no discrete infiltrate. She continues to use xopenex as needed. Now using oxygen at bedtime, not during the day  PCS services requested to assist with bathing and meals, she is now back in her own home  Review of Systems  GEN- denies fatigue, fever, weight loss,weakness, recent illness HEENT- denies eye drainage, change in vision, nasal discharge, CVS- denies chest pain, palpitations RESP- + SOB,+ cough, +wheeze ABD- denies N/V, change in stools, abd pain Neuro- denies headache, dizziness, syncope, seizure activity      Objective:   Physical Exam GEN- NAD, alert and oriented x3 HEENT- PERRL, EOMI, non injected sclera, pink conjunctiva, MMM, oropharynx clear CVS- irregular rhythm, normal rate, no murmur RESPno wheeze, normal WOB, oxygen sat 96% RA, mild rhonchi clears with cough, mild crackles bilat basea EXT- +pedal  Edema Pulses- Radial 2+        Assessment & Plan:

## 2013-06-09 NOTE — Assessment & Plan Note (Signed)
bilat pleural effusions very small, her weight is stable She has not been taking lasix past few weeks Will add back once a day with potassium

## 2013-06-09 NOTE — Telephone Encounter (Signed)
Coughing and wheezing has been taking over counter medication  Sounds terrible when coughs not running fever. [place]  Call back number is [phone]

## 2013-06-09 NOTE — Assessment & Plan Note (Signed)
Change to levaquin once a day Continue xopenex BID for next few days, continue advair Oxygen therapy robtiussin

## 2013-06-16 ENCOUNTER — Encounter: Payer: Medicare Other | Admitting: Cardiology

## 2013-06-16 ENCOUNTER — Encounter: Payer: Self-pay | Admitting: Cardiology

## 2013-06-16 NOTE — Progress Notes (Signed)
Clinical Summary Abigail [name] is a 80 y.o.female seen today for follow up of the following medical problems  1. CAD  - prior PTCA to RCA  - Jan 2014 LVEF 55-60%, also at that time had stress MPI which showed no ischemia  - denies any recent chest pain. Denies any significant SOB or DOE.  - compliant with meds: ASA, atenolol. Prior muscle aches on statins   2. Afib  - compliant with atenolol  - denies any palpitations. She has not been on anticoagulation, previous decision making and discussions on anticoagulation are unclear from the notes. The patient does not recall specifically any details about this.   3. PAD  - denies any recent leg pain   4. HTN  - does not check at home  - compliant with meds   5. Hyperlipidemia  - reports muscle aches on lipitor, not on statin currently  - lipid panel 07/2012: TC 163 TG 58 HDL 62 LDL 89      Past Medical History  Diagnosis Date  . Asthma   . Coronary atherosclerosis of native coronary artery     s/p PTCA RCA (Dr. Clayborn Bigness, [place])  . PVD (peripheral vascular disease)   . Atrial fibrillation   . Essential hypertension, benign   . Amenia   . COPD (chronic obstructive pulmonary disease) 2008    Emphysemaous with asthmatic component-FEV 2008 34%  . Rectal bleeding   . SBO (small bowel obstruction)   . MRSA (methicillin resistant Staphylococcus aureus)   . Nontoxic multinodular goiter   . History of esophagitis     Gastritis, duodenitis. EGD neg 10/20/02  . Hypercholesterolemia   . Arteriovenous malformation of duodenum   . Leg cramps     With mild claudication  . Diverticulosis of colon   . Hx of colonic polyps   . Gall stones 10/1997  . Goiter     thyroid US- bx 06/26/03  . Femoral neck fracture     ORIF, Dr. Latanya Maudlin 02/20/08  . Compression fracture of vertebra     T4/ kyphoplasty, hosp 12/31-06/02/10  . Carcinoma of colon     s/p resection  . Skin cancer     L forearm 2012, per derm     Allergies  Allergen  Reactions  . Budesonide-Formoterol Fumarate Other (See Comments)    REACTION: STATES FEELS AS IF HAVING HEART ATTACK  . Sulfa Antibiotics Other (See Comments)    "just doesn't agree with me"  . Sulfonamide Derivatives Other (See Comments)    REACTION: unknown  . Diltiazem Other (See Comments)    Low pulse  . Metoprolol Other (See Comments)    Low pulse     Current Outpatient Prescriptions  Medication Sig Dispense Refill  . acetaminophen (TYLENOL) 500 MG tablet Take 500 mg by mouth at bedtime as needed for pain.       Marland Kitchen aspirin 81 MG tablet Take 81 mg by mouth every morning.       Marland Kitchen atenolol (TENORMIN) 25 MG tablet TAKE 25MG  ALONG WITH THE 50MG  TAB TO EQUAL 75MG  ONE TIME DAILY  30 tablet  5  . atenolol (TENORMIN) 50 MG tablet Take 1 tablet (50 mg total) by mouth daily. TAKE ONE 50MG  TABLET ALONG WITH ONE 25MG  TABLET TO EQUAL 75MG  DAILY  30 tablet  6  . Calcium 1500 MG tablet Take 1,500 mg by mouth every morning.       . cholecalciferol (VITAMIN D) 1000 UNITS tablet Take 1,000 Units  by mouth daily.        Marland Kitchen CRANBERRY PO Take 1 tablet by mouth 2 (two) times daily.      . Cyanocobalamin (VITAMIN B 12 PO) Take 500 mcg by mouth daily.      . Fluticasone-Salmeterol (ADVAIR DISKUS) 250-50 MCG/DOSE AEPB Inhale 1 puff into the lungs 2 (two) times daily.  60 each  3  . furosemide (LASIX) 20 MG tablet Take 20 mg by mouth daily.      Marland Kitchen GRAPE SEED PO Take 1 capsule by mouth 2 (two) times daily.       Marland Kitchen HYDROcodone-acetaminophen (NORCO/VICODIN) 5-325 MG per tablet Take 1 tablet by mouth every 4 (four) hours as needed.  30 tablet  0  . IRON PO Take 0.5 tablets by mouth daily.      Marland Kitchen levalbuterol (XOPENEX HFA) 45 MCG/ACT inhaler Inhale 2 puffs into the lungs every 6 (six) hours as needed for wheezing.  1 Inhaler  3  . levofloxacin (LEVAQUIN) 500 MG tablet Take 1 tablet (500 mg total) by mouth daily.  7 tablet  0  . Multiple Vitamins-Minerals (CENTRUM SILVER PO) Take 1 tablet by mouth daily.       .  nitroGLYCERIN (NITROSTAT) 0.4 MG SL tablet Place one under tongue every 5 minutes x 3 as needed  25 tablet  1  . Omega-3 Fatty Acids (FISH OIL) 1000 MG CAPS Take 1 capsule by mouth 2 (two) times daily.       Marland Kitchen omeprazole (PRILOSEC) 20 MG capsule Take 20 mg by mouth every morning.       . potassium chloride (K-DUR) 10 MEQ tablet       . potassium chloride (K-DUR,KLOR-CON) 10 MEQ tablet Take 1 tablet (10 mEq total) by mouth daily.  30 tablet  3  . traMADol (ULTRAM) 50 MG tablet Take 1 tablet (50 mg total) by mouth 2 (two) times daily as needed.  45 tablet  1   No current facility-administered medications for this visit.     Past Surgical History  Procedure Laterality Date  . Abdominal hysterectomy    . Hip fracture surgery    . Colon resection  05/1997    cancer and carcinoid tumor  . Ileostomy  05/1997    takedown 7/99  . Angioplasty  7/00    R CA  . Abdominal surgery      incarcerated ventral hernia, MRSA 06/28/00  . Cataract extraction  2004    bilateral  . Total abdominal hysterectomy    . Cardiac catheterization      stent/ ER, ok LE's 10/00. PV airtigraogy- stent 10/00     Allergies  Allergen Reactions  . Budesonide-Formoterol Fumarate Other (See Comments)    REACTION: STATES FEELS AS IF HAVING HEART ATTACK  . Sulfa Antibiotics Other (See Comments)    "just doesn't agree with me"  . Sulfonamide Derivatives Other (See Comments)    REACTION: unknown  . Diltiazem Other (See Comments)    Low pulse  . Metoprolol Other (See Comments)    Low pulse      Family History  Problem Relation Age of Onset  . Kidney disease Mother     Died of kidney failure  . Cancer Father     Stomach     Social History Ms. Bouyer reports that she has quit smoking. Her smoking use included Cigarettes. She smoked 0.00 packs per day. She has never used smokeless tobacco. Ms. Freitas reports that she does not drink alcohol.  Review of Systems CONSTITUTIONAL: No weight loss, fever, chills,  weakness or fatigue.  HEENT: Eyes: No visual loss, blurred vision, double vision or yellow sclerae.No hearing loss, sneezing, congestion, runny nose or sore throat.  SKIN: No rash or itching.  CARDIOVASCULAR:  RESPIRATORY: No shortness of breath, cough or sputum.  GASTROINTESTINAL: No anorexia, nausea, vomiting or diarrhea. No abdominal pain or blood.  GENITOURINARY: No burning on urination, no polyuria NEUROLOGICAL: No headache, dizziness, syncope, paralysis, ataxia, numbness or tingling in the extremities. No change in bowel or bladder control.  MUSCULOSKELETAL: No muscle, back pain, joint pain or stiffness.  LYMPHATICS: No enlarged nodes. No history of splenectomy.  PSYCHIATRIC: No history of depression or anxiety.  ENDOCRINOLOGIC: No reports of sweating, cold or heat intolerance. No polyuria or polydipsia.  Marland Kitchen   Physical Examination There were no vitals filed for this visit. There were no vitals filed for this visit.  Gen: resting comfortably, no acute distress HEENT: no scleral icterus, pupils equal round and reactive, no palptable cervical adenopathy,  CV Resp: Clear to auscultation bilaterally GI: abdomen is soft, non-tender, non-distended, normal bowel sounds, no hepatosplenomegaly MSK: extremities are warm, no edema.  Skin: warm, no rash Neuro:  no focal deficits Psych: appropriate affect   Diagnostic Studies Jan 2014 Echo: LVEF 55-60%, no WMAs,   Jan 2014 MPI:  Negative pharmacologic stress nuclear myocardial study revealing no significant stress-induced EKG abnormalities, normal left ventricular size, normal left ventricular systolic function and normal myocardial perfusion except for prominent physiologic apical thinning and diaphragmatic attenuation. Other findings as noted.      Assessment and Plan   1. CAD  - no current symptoms  - continue current medications   2. Afib  - denies any symptoms  - continue rate contorl with atenolol  - this is her  first visit with me, the previous discussions and decisions on anticoagulation are unclear from the many notes I've reviewed. Her CHADS2 score is 2 (age, HTN). She has documented in her past history rectal bleeding and history of duodenal AVMs, the exact details of both this diagnoses is unclear. I will continue to review back into her records for any contraindication to anticoagulation, and she will return in 2 months to readdress the issue. She is 62 but is independent with no history of falls, she may be reasonable anticoag candidate unless there is a clear contraindication based on her prior records.   3. HTN  - at goal, continue current meds   4. HL  - reports myalgias to statins previously, unclear which ones she head this reaction to. LDL slightly above goal of 70, will review records to see which statins she had adverse reactions to. She may be able to tolerate a mild statin such as pravastatin if not tried before.       Arnoldo Lenis, M.D., F.A.C.C.

## 2013-06-25 ENCOUNTER — Other Ambulatory Visit: Payer: Self-pay | Admitting: Family Medicine

## 2013-06-25 ENCOUNTER — Telehealth: Payer: Self-pay | Admitting: *Deleted

## 2013-06-25 DIAGNOSIS — R269 Unspecified abnormalities of gait and mobility: Secondary | ICD-10-CM

## 2013-06-25 DIAGNOSIS — M5136 Other intervertebral disc degeneration, lumbar region: Secondary | ICD-10-CM

## 2013-06-25 MED ORDER — POTASSIUM CHLORIDE ER 10 MEQ PO TBCR: 10.0000 meq | EXTENDED_RELEASE_TABLET | Freq: Once | ORAL | Status: DC

## 2013-06-25 NOTE — Telephone Encounter (Signed)
Okay to refill, give 6

## 2013-06-25 NOTE — Telephone Encounter (Signed)
[facility] ER [id number]

## 2013-07-03 ENCOUNTER — Telehealth: Payer: Self-pay | Admitting: *Deleted

## 2013-07-03 NOTE — Telephone Encounter (Signed)
Orson Ape called stating pt is needing a script written for a walker with basket and seat and PT evaluation sent to [facility].

## 2013-07-04 NOTE — Telephone Encounter (Signed)
Script sent to [facility] this am by fax

## 2013-07-04 NOTE — Telephone Encounter (Signed)
Send script.

## 2013-07-06 ENCOUNTER — Emergency Department (HOSPITAL_COMMUNITY): Payer: Medicare Other

## 2013-07-06 ENCOUNTER — Encounter (HOSPITAL_COMMUNITY): Payer: Self-pay | Admitting: Emergency Medicine

## 2013-07-06 ENCOUNTER — Inpatient Hospital Stay (HOSPITAL_COMMUNITY)
Admission: EM | Admit: 2013-07-06 | Discharge: 2013-07-10 | DRG: 308 | Disposition: A | Discharge status: Home Health | Payer: Medicare Other | Attending: Internal Medicine | Admitting: Internal Medicine

## 2013-07-06 DIAGNOSIS — J209 Acute bronchitis, unspecified: Secondary | ICD-10-CM

## 2013-07-06 DIAGNOSIS — I739 Peripheral vascular disease, unspecified: Secondary | ICD-10-CM

## 2013-07-06 DIAGNOSIS — Z85828 Personal history of other malignant neoplasm of skin: Secondary | ICD-10-CM

## 2013-07-06 DIAGNOSIS — J449 Chronic obstructive pulmonary disease, unspecified: Secondary | ICD-10-CM

## 2013-07-06 DIAGNOSIS — M549 Dorsalgia, unspecified: Secondary | ICD-10-CM

## 2013-07-06 DIAGNOSIS — I2510 Atherosclerotic heart disease of native coronary artery without angina pectoris: Secondary | ICD-10-CM

## 2013-07-06 DIAGNOSIS — M533 Sacrococcygeal disorders, not elsewhere classified: Secondary | ICD-10-CM

## 2013-07-06 DIAGNOSIS — J438 Other emphysema: Secondary | ICD-10-CM | POA: Diagnosis present

## 2013-07-06 DIAGNOSIS — Z9861 Coronary angioplasty status: Secondary | ICD-10-CM

## 2013-07-06 DIAGNOSIS — I4891 Unspecified atrial fibrillation: Secondary | ICD-10-CM

## 2013-07-06 DIAGNOSIS — K2960 Other gastritis without bleeding: Secondary | ICD-10-CM

## 2013-07-06 DIAGNOSIS — M51369 Other intervertebral disc degeneration, lumbar region without mention of lumbar back pain or lower extremity pain: Secondary | ICD-10-CM

## 2013-07-06 DIAGNOSIS — J9620 Acute and chronic respiratory failure, unspecified whether with hypoxia or hypercapnia: Secondary | ICD-10-CM | POA: Diagnosis not present

## 2013-07-06 DIAGNOSIS — R609 Edema, unspecified: Secondary | ICD-10-CM

## 2013-07-06 DIAGNOSIS — I6529 Occlusion and stenosis of unspecified carotid artery: Secondary | ICD-10-CM

## 2013-07-06 DIAGNOSIS — R197 Diarrhea, unspecified: Secondary | ICD-10-CM

## 2013-07-06 DIAGNOSIS — N179 Acute kidney failure, unspecified: Secondary | ICD-10-CM

## 2013-07-06 DIAGNOSIS — Z860100 Personal history of colon polyps, unspecified: Secondary | ICD-10-CM

## 2013-07-06 DIAGNOSIS — J9601 Acute respiratory failure with hypoxia: Secondary | ICD-10-CM

## 2013-07-06 DIAGNOSIS — W2203XA Walked into furniture, initial encounter: Secondary | ICD-10-CM | POA: Diagnosis not present

## 2013-07-06 DIAGNOSIS — Z85038 Personal history of other malignant neoplasm of large intestine: Secondary | ICD-10-CM

## 2013-07-06 DIAGNOSIS — I509 Heart failure, unspecified: Secondary | ICD-10-CM

## 2013-07-06 DIAGNOSIS — Z66 Do not resuscitate: Secondary | ICD-10-CM | POA: Diagnosis present

## 2013-07-06 DIAGNOSIS — T502X5A Adverse effect of carbonic-anhydrase inhibitors, benzothiadiazides and other diuretics, initial encounter: Secondary | ICD-10-CM | POA: Diagnosis present

## 2013-07-06 DIAGNOSIS — D696 Thrombocytopenia, unspecified: Secondary | ICD-10-CM

## 2013-07-06 DIAGNOSIS — S8010XA Contusion of unspecified lower leg, initial encounter: Secondary | ICD-10-CM | POA: Diagnosis not present

## 2013-07-06 DIAGNOSIS — E7800 Pure hypercholesterolemia, unspecified: Secondary | ICD-10-CM | POA: Diagnosis present

## 2013-07-06 DIAGNOSIS — Z87891 Personal history of nicotine dependence: Secondary | ICD-10-CM

## 2013-07-06 DIAGNOSIS — I700 Atherosclerosis of aorta: Secondary | ICD-10-CM

## 2013-07-06 DIAGNOSIS — M5136 Other intervertebral disc degeneration, lumbar region: Secondary | ICD-10-CM

## 2013-07-06 DIAGNOSIS — Z9981 Dependence on supplemental oxygen: Secondary | ICD-10-CM

## 2013-07-06 DIAGNOSIS — I10 Essential (primary) hypertension: Secondary | ICD-10-CM

## 2013-07-06 DIAGNOSIS — R600 Localized edema: Secondary | ICD-10-CM

## 2013-07-06 DIAGNOSIS — Y9210 Unspecified residential institution as the place of occurrence of the external cause: Secondary | ICD-10-CM | POA: Diagnosis not present

## 2013-07-06 DIAGNOSIS — I50810 Right heart failure, unspecified: Secondary | ICD-10-CM

## 2013-07-06 DIAGNOSIS — E785 Hyperlipidemia, unspecified: Secondary | ICD-10-CM

## 2013-07-06 DIAGNOSIS — M8080XA Other osteoporosis with current pathological fracture, unspecified site, initial encounter for fracture: Secondary | ICD-10-CM

## 2013-07-06 DIAGNOSIS — J90 Pleural effusion, not elsewhere classified: Secondary | ICD-10-CM

## 2013-07-06 DIAGNOSIS — J4489 Other specified chronic obstructive pulmonary disease: Secondary | ICD-10-CM

## 2013-07-06 DIAGNOSIS — S8011XA Contusion of right lower leg, initial encounter: Secondary | ICD-10-CM

## 2013-07-06 DIAGNOSIS — R0609 Other forms of dyspnea: Secondary | ICD-10-CM

## 2013-07-06 DIAGNOSIS — E049 Nontoxic goiter, unspecified: Secondary | ICD-10-CM

## 2013-07-06 LAB — CBC WITH DIFFERENTIAL/PLATELET
Basophils Absolute: 0 10*3/uL (ref 0.0–0.1)
Basophils Relative: 0 % (ref 0–1)
Eosinophils Absolute: 0.1 10*3/uL (ref 0.0–0.7)
Eosinophils Relative: 1 % (ref 0–5)
HCT: 43.5 % (ref 36.0–46.0)
Hemoglobin: 14.5 g/dL (ref 12.0–15.0)
Lymphocytes Relative: 13 % (ref 12–46)
Lymphs Abs: 0.9 10*3/uL (ref 0.7–4.0)
MCH: 35.3 pg — ABNORMAL HIGH (ref 26.0–34.0)
MCHC: 33.3 g/dL (ref 30.0–36.0)
MCV: 105.8 fL — ABNORMAL HIGH (ref 78.0–100.0)
Monocytes Absolute: 0.7 10*3/uL (ref 0.1–1.0)
Monocytes Relative: 10 % (ref 3–12)
Neutro Abs: 5.7 10*3/uL (ref 1.7–7.7)
Neutrophils Relative %: 77 % (ref 43–77)
Platelets: 84 10*3/uL — ABNORMAL LOW (ref 150–400)
RBC: 4.11 MIL/uL (ref 3.87–5.11)
RDW: 15.5 % (ref 11.5–15.5)
WBC: 7.4 10*3/uL (ref 4.0–10.5)

## 2013-07-06 LAB — BASIC METABOLIC PANEL
BUN: 22 mg/dL (ref 6–23)
CO2: 33 mEq/L — ABNORMAL HIGH (ref 19–32)
Calcium: 9.4 mg/dL (ref 8.4–10.5)
Chloride: 98 mEq/L (ref 96–112)
Creatinine, Ser: 1.15 mg/dL — ABNORMAL HIGH (ref 0.50–1.10)
GFR calc Af Amer: 47 mL/min — ABNORMAL LOW (ref >90)
GFR calc non Af Amer: 40 mL/min — ABNORMAL LOW (ref >90)
Glucose, Bld: 92 mg/dL (ref 70–99)
Potassium: 4 mEq/L (ref 3.7–5.3)
Sodium: 142 mEq/L (ref 137–147)

## 2013-07-06 LAB — PRO B NATRIURETIC PEPTIDE: Pro B Natriuretic peptide (BNP): 3928 pg/mL — ABNORMAL HIGH (ref 0–450)

## 2013-07-06 LAB — TROPONIN I
Troponin I: <0.3 ng/mL (ref <0.30)
Troponin I: <0.3 ng/mL (ref <0.30)

## 2013-07-06 MED ORDER — ATENOLOL 25 MG PO TABS: 25.0000 mg | ORAL_TABLET | Freq: Every day | ORAL | Status: DC

## 2013-07-06 MED ORDER — SODIUM CHLORIDE 0.9 % IV SOLN: 250.0000 mL | INTRAVENOUS | Status: DC | PRN

## 2013-07-06 MED ORDER — METOPROLOL TARTRATE 1 MG/ML IV SOLN
5.0000 mg | Freq: Four times a day (QID) | INTRAVENOUS | Status: DC | PRN
  Administered 2013-07-07 (×2): 5 mg via INTRAVENOUS
  Filled 2013-07-06 (×2): qty 5

## 2013-07-06 MED ORDER — POTASSIUM CHLORIDE ER 10 MEQ PO TBCR
10.0000 meq | EXTENDED_RELEASE_TABLET | Freq: Once | ORAL | Status: AC
  Administered 2013-07-06: 10 meq via ORAL
  Filled 2013-07-06 (×2): qty 1

## 2013-07-06 MED ORDER — LEVALBUTEROL TARTRATE 45 MCG/ACT IN AERO: 2.0000 | INHALATION_SPRAY | Freq: Four times a day (QID) | RESPIRATORY_TRACT | Status: DC | PRN

## 2013-07-06 MED ORDER — ALBUTEROL SULFATE (2.5 MG/3ML) 0.083% IN NEBU: 2.5000 mg | INHALATION_SOLUTION | Freq: Four times a day (QID) | RESPIRATORY_TRACT | Status: DC | PRN

## 2013-07-06 MED ORDER — METOPROLOL TARTRATE 1 MG/ML IV SOLN
5.0000 mg | Freq: Once | INTRAVENOUS | Status: AC
  Administered 2013-07-06: 5 mg via INTRAVENOUS
  Filled 2013-07-06: qty 5

## 2013-07-06 MED ORDER — SODIUM CHLORIDE 0.9 % IJ SOLN
3.0000 mL | Freq: Two times a day (BID) | INTRAMUSCULAR | Status: DC
  Administered 2013-07-08 – 2013-07-10 (×4): 3 mL via INTRAVENOUS

## 2013-07-06 MED ORDER — ONDANSETRON HCL 4 MG/2ML IJ SOLN: 4.0000 mg | Freq: Four times a day (QID) | INTRAMUSCULAR | Status: DC | PRN

## 2013-07-06 MED ORDER — FUROSEMIDE 10 MG/ML IJ SOLN
20.0000 mg | Freq: Two times a day (BID) | INTRAMUSCULAR | Status: DC
  Administered 2013-07-06 – 2013-07-07 (×2): 20 mg via INTRAVENOUS
  Filled 2013-07-06 (×2): qty 2

## 2013-07-06 MED ORDER — FUROSEMIDE 10 MG/ML IJ SOLN
40.0000 mg | Freq: Once | INTRAMUSCULAR | Status: AC
  Administered 2013-07-06: 40 mg via INTRAVENOUS
  Filled 2013-07-06: qty 4

## 2013-07-06 MED ORDER — ACETAMINOPHEN 325 MG PO TABS
650.0000 mg | ORAL_TABLET | ORAL | Status: DC | PRN
  Administered 2013-07-07: 650 mg via ORAL
  Filled 2013-07-06: qty 2

## 2013-07-06 MED ORDER — POTASSIUM CHLORIDE CRYS ER 20 MEQ PO TBCR
20.0000 meq | EXTENDED_RELEASE_TABLET | Freq: Once | ORAL | Status: AC
  Administered 2013-07-06: 20 meq via ORAL
  Filled 2013-07-06: qty 1

## 2013-07-06 MED ORDER — SODIUM CHLORIDE 0.9 % IJ SOLN: 3.0000 mL | INTRAMUSCULAR | Status: DC | PRN

## 2013-07-06 MED ORDER — ATENOLOL 25 MG PO TABS
75.0000 mg | ORAL_TABLET | Freq: Every day | ORAL | Status: DC
  Administered 2013-07-07: 75 mg via ORAL
  Filled 2013-07-06: qty 3

## 2013-07-06 MED ORDER — PANTOPRAZOLE SODIUM 40 MG PO TBEC
40.0000 mg | DELAYED_RELEASE_TABLET | Freq: Every day | ORAL | Status: DC
  Administered 2013-07-07 – 2013-07-10 (×4): 40 mg via ORAL
  Filled 2013-07-06 (×4): qty 1

## 2013-07-06 NOTE — ED Provider Notes (Signed)
CSN: [id number]     Arrival date & time 07/06/13  1153 History  This chart was scribed for Virgel Manifold, MD by Roxan Diesel, ED scribe.  This patient was seen in room APA19/APA19 and the patient's care was started at 1:29 PM.   Chief Complaint  Patient presents with  . Shortness of Breath    The history is provided by the patient and a relative. No language interpreter was used.    HPI Comments: Abigail [name] is a 80 y.o. female with h/o A-fib, COPD, and asthma who presents to the Emergency Department complaining of SOB that began 2-3 days ago.  Pt states her SOB came on gradually and has been gradually-worsening.  She states she feels fine at rest but when she stands up and moves she becomes very short of breath.   She also complains of worsening of her chronic leg swelling.  She admits to orthopnea but this is a chronic issue for several years.  She denies pain to any area.  She denies cough, fevers, or chills. Pt was diagnosed with A-fib several years ago.  She is a former smoker.  She uses 2-3L oxygen at night and has had increased daytime usage over the past several days.    Cardiologist is Dr. Wyatt Haste   Past Medical History  Diagnosis Date  . Asthma   . Coronary atherosclerosis of native coronary artery     s/p PTCA RCA (Dr. Clayborn Bigness, [place])  . PVD (peripheral vascular disease)   . Atrial fibrillation   . Essential hypertension, benign   . Amenia   . COPD (chronic obstructive pulmonary disease) 2008    Emphysemaous with asthmatic component-FEV 2008 34%  . Rectal bleeding   . SBO (small bowel obstruction)   . MRSA (methicillin resistant Staphylococcus aureus)   . Nontoxic multinodular goiter   . History of esophagitis     Gastritis, duodenitis. EGD neg 10/20/02  . Hypercholesterolemia   . Arteriovenous malformation of duodenum   . Leg cramps     With mild claudication  . Diverticulosis of colon   . Hx of colonic polyps   . Gall stones 10/1997  . Goiter      thyroid US- bx 06/26/03  . Femoral neck fracture     ORIF, Dr. Latanya Maudlin 02/20/08  . Compression fracture of vertebra     T4/ kyphoplasty, hosp 12/31-06/02/10  . Carcinoma of colon     s/p resection  . Skin cancer     L forearm 2012, per derm    Past Surgical History  Procedure Laterality Date  . Abdominal hysterectomy    . Hip fracture surgery    . Colon resection  05/1997    cancer and carcinoid tumor  . Ileostomy  05/1997    takedown 7/99  . Angioplasty  7/00    R CA  . Abdominal surgery      incarcerated ventral hernia, MRSA 06/28/00  . Cataract extraction  2004    bilateral  . Total abdominal hysterectomy    . Cardiac catheterization      stent/ ER, ok LE's 10/00. PV airtigraogy- stent 10/00    Family History  Problem Relation Age of Onset  . Kidney disease Mother     Died of kidney failure  . Cancer Father     Stomach    History  Substance Use Topics  . Smoking status: Former Smoker    Types: Cigarettes  . Smokeless tobacco: Never Used  Comment: quit 10-11 years ago  . Alcohol Use: No    OB History   Grav Para Term Preterm Abortions TAB SAB Ect Mult Living                  Review of Systems  All other systems reviewed and are negative.     Allergies  Budesonide-formoterol fumarate; Sulfa antibiotics; Sulfonamide derivatives; Diltiazem; and Metoprolol  Home Medications   Current Outpatient Rx  Name  Route  Sig  Dispense  Refill  . aspirin 81 MG tablet   Oral   Take 81 mg by mouth every morning.          Marland Kitchen atenolol (TENORMIN) 25 MG tablet   Oral   Take 25 mg by mouth daily. TAKE 25MG  ALONG WITH THE 50MG  TAB TO EQUAL 75MG  ONE TIME DAILY         . atenolol (TENORMIN) 50 MG tablet   Oral   Take 1 tablet (50 mg total) by mouth daily. TAKE ONE 50MG  TABLET ALONG WITH ONE 25MG  TABLET TO EQUAL 75MG  DAILY   30 tablet   6   . Calcium 1500 MG tablet   Oral   Take 1,500 mg by mouth every morning.          . cholecalciferol (VITAMIN D) 1000  UNITS tablet   Oral   Take 1,000 Units by mouth daily.           Marland Kitchen CRANBERRY PO   Oral   Take 1 tablet by mouth 2 (two) times daily.         . Cyanocobalamin (VITAMIN B 12 PO)   Oral   Take 500 mcg by mouth daily.         . Fluticasone-Salmeterol (ADVAIR DISKUS) 250-50 MCG/DOSE AEPB   Inhalation   Inhale 1 puff into the lungs 2 (two) times daily.   60 each   3   . furosemide (LASIX) 20 MG tablet   Oral   Take 20 mg by mouth daily.         Marland Kitchen GRAPE SEED PO   Oral   Take 1 capsule by mouth 2 (two) times daily.          . IRON PO   Oral   Take 0.5 tablets by mouth daily.         Marland Kitchen levalbuterol (XOPENEX HFA) 45 MCG/ACT inhaler   Inhalation   Inhale 2 puffs into the lungs every 6 (six) hours as needed for wheezing.   1 Inhaler   3   . Multiple Vitamins-Minerals (CENTRUM SILVER PO)   Oral   Take 1 tablet by mouth daily.          . Omega-3 Fatty Acids (FISH OIL) 1000 MG CAPS   Oral   Take 1 capsule by mouth 2 (two) times daily.          Marland Kitchen omeprazole (PRILOSEC) 20 MG capsule   Oral   Take 20 mg by mouth every morning.          . potassium chloride (K-DUR) 10 MEQ tablet   Oral   Take 1 tablet (10 mEq total) by mouth once.   30 tablet   3   . nitroGLYCERIN (NITROSTAT) 0.4 MG SL tablet      Place one under tongue every 5 minutes x 3 as needed   25 tablet   1    BP 113/71  Pulse 119  Temp(Src) 97.6 F (36.4 C) (Oral)  Resp 24  SpO2 98%  Physical Exam  Nursing note and vitals reviewed. Constitutional: She appears well-developed and well-nourished. No distress.  HENT:  Head: Normocephalic and atraumatic.  Eyes: Conjunctivae are normal. Right eye exhibits no discharge. Left eye exhibits no discharge.  Neck: Neck supple.  Cardiovascular: Normal heart sounds.  An irregularly irregular rhythm present. Tachycardia present.  Exam reveals no gallop and no friction rub.   No murmur heard. Pulmonary/Chest: Effort normal. No respiratory distress.   Crackles at bilateral bases  Abdominal: Soft. She exhibits no distension. There is no tenderness.  Musculoskeletal: She exhibits edema (pitting lower extremity edema). She exhibits no tenderness.  Neurological: She is alert.  Skin: Skin is warm and dry.  Psychiatric: She has a normal mood and affect. Her behavior is normal. Thought content normal.    ED Course  Procedures (including critical care time)  DIAGNOSTIC STUDIES: Oxygen Saturation is 98% on room air, normal by my interpretation.    COORDINATION OF CARE: 1:40 PM-Discussed treatment plan which includes bloodwork, IV fluids, and CXR with pt at bedside and pt agreed to plan.     Labs Review Labs Reviewed  CBC WITH DIFFERENTIAL - Abnormal; Notable for the following:    MCV 105.8 (*)    MCH 35.3 (*)    Platelets 84 (*)    All other components within normal limits  BASIC METABOLIC PANEL - Abnormal; Notable for the following:    CO2 33 (*)    Creatinine, Ser 1.15 (*)    GFR calc non Af Amer 40 (*)    GFR calc Af Amer 47 (*)    All other components within normal limits  PRO B NATRIURETIC PEPTIDE - Abnormal; Notable for the following:    Pro B Natriuretic peptide (BNP) 3928.0 (*)    All other components within normal limits  TROPONIN I     Imaging Review Dg Chest 2 View  07/06/2013   CLINICAL DATA:  Shortness of Breath  EXAM: CHEST  2 VIEW  COMPARISON:  June 09, 2013  FINDINGS: There is underlying emphysematous change. There is stable interstitial prominence, prior chronic inflammatory type change. A degree of chronic congestive heart failure, however, cannot be excluded. There is no airspace consolidation.  Heart is mildly enlarged. The pulmonary vascularity is overall within normal limits, possibly slightly prominent given underlying emphysema. The appearance is stable.  There is extensive atherosclerotic change in the aorta. No adenopathy. Patient is status post kyphoplasty at T4. Marked collapse of the T6 vertebral  body is stable. There is increase in kyphosis in the mid thoracic region.  IMPRESSION: Stable chest. Underlying emphysema. Question chronic inflammatory change versus a degree of chronic congestive heart failure superimposed on emphysema. No airspace consolidation. Extensive atherosclerotic change. Collapse of the T6 vertebral body with focal kyphosis in the midthoracic region is stable.   Electronically Signed   By: Lowella Grip M.D.   On: 07/06/2013 12:43     EKG:  Rhythm: afib with RVR Vent. rate 110 BPM QRS duration 146 ms QT/QTc 374/506 ms LAD RBBB   MDM   1. Atrial fibrillation with RVR   2. CHF (congestive heart failure)   3. CHF with right heart failure   4. COPD (chronic obstructive pulmonary disease)   5. Acute renal failure   6. Back pain   7. Essential hypertension, benign   8. Other and unspecified hyperlipidemia      I personally preformed the services scribed in my presence. The recorded information  has been reviewed is accurate. Virgel Manifold, MD.    Virgel Manifold, MD 07/09/13 [id number]

## 2013-07-06 NOTE — ED Notes (Signed)
Sob times 3 days.  Increase lower extremity edema.  Denies any other symptoms

## 2013-07-06 NOTE — H&P (Signed)
History and Physical  Abigail [name] [id number] DOB: 1919-09-14 DOA: 07/06/2013  Referring physician: Eugenio Hoes, MD PCP: Vic Blackbird, MD   Chief Complaint: SOB  HPI:  80 year old followed by cardiology for history of coronary artery disease, atrial fibrillation (not on anticoagulation at this time pending record review) presented to the emergency department with several day history of increasing shortness of breath. Initial evaluation notable for atrial fibrillation with rapid ventricular response, lower extremity edema and acute heart failure was considered.  Patient has a history of lower extremity edema which has been treated with Lasix for some time now. Last echocardiogram on file 05/2012 with normal LVEF 55-60%. Normal wall motion. No comment on diastolic function. No significant other abnormalities were noted at that time. She is been compliant with her medications including diuretics and beta blocker. The last several days she has had increasing lower extremity edema and increasing shortness of breath which somewhat improves with rest but it has been unresponsive to breathing treatments. She has no orthopnea but because of worsening shortness of breath she came to the emergency department. She has had no chest pain.  In the emergency department afebrile. Heart rate up to 130s. SpO2 high 90s on 2 L (chronic respiratory failure). Basic metabolic panel unremarkable. Troponin negative. BNP 3928. CBC notable for thrombocytopenia, chronic finding. Chest x-Clodfelter suggested underlying emphysema, chronic congestive heart failure. No airspace consolidation. EKG independently reviewed showed atrial fibrillation with rapid ventricular response, right bundle branch block. Compared to previous study 09/12/2012 no acute changes were seen.  Review of Systems:  Negative for fever, visual changes, sore throat, rash, new muscle aches, chest pain, dysuria, bleeding, n/v/abdominal pain.  Past Medical History    Diagnosis Date  . Asthma   . Coronary atherosclerosis of native coronary artery     s/p PTCA RCA (Dr. Clayborn Bigness, [place])  . PVD (peripheral vascular disease)   . Atrial fibrillation   . Essential hypertension, benign   . Amenia   . COPD (chronic obstructive pulmonary disease) 2008    Emphysemaous with asthmatic component-FEV 2008 34%  . Rectal bleeding   . SBO (small bowel obstruction)   . MRSA (methicillin resistant Staphylococcus aureus)   . Nontoxic multinodular goiter   . History of esophagitis     Gastritis, duodenitis. EGD neg 10/20/02  . Hypercholesterolemia   . Arteriovenous malformation of duodenum   . Leg cramps     With mild claudication  . Diverticulosis of colon   . Hx of colonic polyps   . Gall stones 10/1997  . Goiter     thyroid US- bx 06/26/03  . Femoral neck fracture     ORIF, Dr. Latanya Maudlin 02/20/08  . Compression fracture of vertebra     T4/ kyphoplasty, hosp 12/31-06/02/10  . Carcinoma of colon     s/p resection  . Skin cancer     L forearm 2012, per derm    Past Surgical History  Procedure Laterality Date  . Abdominal hysterectomy    . Hip fracture surgery    . Colon resection  05/1997    cancer and carcinoid tumor  . Ileostomy  05/1997    takedown 7/99  . Angioplasty  7/00    R CA  . Abdominal surgery      incarcerated ventral hernia, MRSA 06/28/00  . Cataract extraction  2004    bilateral  . Total abdominal hysterectomy    . Cardiac catheterization      stent/ ER, ok LE's 10/00. PV airtigraogy-  stent 10/00    Social History:  reports that she has quit smoking. Her smoking use included Cigarettes. She smoked 0.00 packs per day. She has never used smokeless tobacco. She reports that she does not drink alcohol or use illicit drugs.  Allergies  Allergen Reactions  . Budesonide-Formoterol Fumarate Other (See Comments)    REACTION: STATES FEELS AS IF HAVING HEART ATTACK  . Sulfa Antibiotics Other (See Comments)    "just doesn't agree with me"   . Sulfonamide Derivatives Other (See Comments)    REACTION: unknown  . Diltiazem Other (See Comments)    Low pulse  . Metoprolol Other (See Comments)    Low pulse    Family History  Problem Relation Age of Onset  . Kidney disease Mother     Died of kidney failure  . Cancer Father     Stomach     Prior to Admission medications   Medication Sig Start Date End Date Taking? Authorizing Provider  aspirin 81 MG tablet Take 81 mg by mouth every morning.    Yes Historical Provider, MD  atenolol (TENORMIN) 25 MG tablet Take 25 mg by mouth daily. TAKE 25MG  ALONG WITH THE 50MG  TAB TO EQUAL 75MG  ONE TIME DAILY 09/23/12  Yes Lendon Colonel, NP  atenolol (TENORMIN) 50 MG tablet Take 1 tablet (50 mg total) by mouth daily. TAKE ONE 50MG  TABLET ALONG WITH ONE 25MG  TABLET TO EQUAL 75MG  DAILY 04/16/13  Yes Lendon Colonel, NP  Calcium 1500 MG tablet Take 1,500 mg by mouth every morning.    Yes Historical Provider, MD  cholecalciferol (VITAMIN D) 1000 UNITS tablet Take 1,000 Units by mouth daily.     Yes Historical Provider, MD  CRANBERRY PO Take 1 tablet by mouth 2 (two) times daily.   Yes Historical Provider, MD  Cyanocobalamin (VITAMIN B 12 PO) Take 500 mcg by mouth daily.   Yes Historical Provider, MD  Fluticasone-Salmeterol (ADVAIR DISKUS) 250-50 MCG/DOSE AEPB Inhale 1 puff into the lungs 2 (two) times daily. 03/18/13  Yes Alycia Rossetti, MD  furosemide (LASIX) 20 MG tablet Take 20 mg by mouth daily. 09/10/12 09/10/13 Yes Alycia Rossetti, MD  GRAPE SEED PO Take 1 capsule by mouth 2 (two) times daily.    Yes Historical Provider, MD  IRON PO Take 0.5 tablets by mouth daily.   Yes Historical Provider, MD  levalbuterol [facility] HFA) 45 MCG/ACT inhaler Inhale 2 puffs into the lungs every 6 (six) hours as needed for wheezing. 05/27/13  Yes Alycia Rossetti, MD  Multiple Vitamins-Minerals (CENTRUM SILVER PO) Take 1 tablet by mouth daily.    Yes Historical Provider, MD  Omega-3 Fatty Acids (FISH  OIL) 1000 MG CAPS Take 1 capsule by mouth 2 (two) times daily.    Yes Historical Provider, MD  omeprazole (PRILOSEC) 20 MG capsule Take 20 mg by mouth every morning.    Yes Historical Provider, MD  potassium chloride (K-DUR) 10 MEQ tablet Take 1 tablet (10 mEq total) by mouth once. 06/25/13  Yes Alycia Rossetti, MD  nitroGLYCERIN (NITROSTAT) 0.4 MG SL tablet Place one under tongue every 5 minutes x 3 as needed 07/17/11   Tonia Ghent, MD   Physical Exam: Filed Vitals:   07/06/13 1225 07/06/13 1412 07/06/13 1535 07/06/13 1600  BP:  116/72 121/83 116/74  Pulse:  35 54   Temp: 97.6 F (36.4 C)     TempSrc: Oral     Resp:  20 24 21   SpO2:  100% 98% 98%   General: Examined in emergency department. Appears calm and mildly uncomfortable. Eyes: PERRL, normal lids, irises . Wears glasses. ENT: grossly normal hearing, lips & tongue Neck: no LAD, masses or thyromegaly Cardiovascular: Irregular, tachycardic, no m/r/g. 2+ bilateral lower extremity edema. Respiratory: CTA bilaterally, no w/r/r. Mild increased respiratory effort. Pursed lip breathing. Abdomen: soft, ntnd Skin: no rash or induration seen  Musculoskeletal: grossly normal tone BUE/BLE Psychiatric: grossly normal mood and affect, speech fluent and appropriate Neurologic: grossly non-focal.  Wt Readings from Last 3 Encounters:  06/09/13 55.112 kg (121 lb 8 oz)  05/27/13 55.339 kg (122 lb)  05/14/13 56.019 kg (123 lb 8 oz)    Labs on Admission:  Basic Metabolic Panel:  Recent Labs Lab 07/06/13 1350  NA 142  K 4.0  CL 98  CO2 33*  GLUCOSE 92  BUN 22  CREATININE 1.15*  CALCIUM 9.4   CBC:  Recent Labs Lab 07/06/13 1350  WBC 7.4  NEUTROABS 5.7  HGB 14.5  HCT 43.5  MCV 105.8*  PLT 84*    Cardiac Enzymes:  Recent Labs Lab 07/06/13 1350  TROPONINI <0.30    Recent Labs  09/10/12 2234 07/06/13 1350  PROBNP 1840.0* 3928.0*   Radiological Exams on Admission: Dg Chest 2 View  07/06/2013   CLINICAL DATA:   Shortness of Breath  EXAM: CHEST  2 VIEW  COMPARISON:  June 09, 2013  FINDINGS: There is underlying emphysematous change. There is stable interstitial prominence, prior chronic inflammatory type change. A degree of chronic congestive heart failure, however, cannot be excluded. There is no airspace consolidation.  Heart is mildly enlarged. The pulmonary vascularity is overall within normal limits, possibly slightly prominent given underlying emphysema. The appearance is stable.  There is extensive atherosclerotic change in the aorta. No adenopathy. Patient is status post kyphoplasty at T4. Marked collapse of the T6 vertebral body is stable. There is increase in kyphosis in the mid thoracic region.  IMPRESSION: Stable chest. Underlying emphysema. Question chronic inflammatory change versus a degree of chronic congestive heart failure superimposed on emphysema. No airspace consolidation. Extensive atherosclerotic change. Collapse of the T6 vertebral body with focal kyphosis in the midthoracic region is stable.   Electronically Signed   By: Lowella Grip M.D.   On: 07/06/2013 12:43    Principal Problem:   Atrial fibrillation with RVR Active Problems:   CAD, NATIVE VESSEL   Thrombocytopenia   COPD (chronic obstructive pulmonary disease)   Assessment/Plan 1. Atrial fibrillation with rapid ventricular response. Compliant with medications. No chest pain. No signs or symptoms to suggest ACS. Has improved with IV metoprolol in the emergency department. 2. Dyspnea on exertion. Possible acute heart failure type unknown, likely of rate related. No history of systolic or diastolic dysfunction or valvular abnormalities. 3. Chronic bilateral lower extremity edema. Patient reports improvement in the emergency department with IV Lasix. 4. Chronic thrombocytopenia. 5. COPD. Appears stable at this point. No wheezing. History does not suggest exacerbation. 6. History of coronary artery disease. No recent symptoms  to suggest ACS. She has taken aspirin today.   Admit to telemetry. Continue outpatient medications for atrial fibrillation. IV metoprolol as needed. She is relatively asymptomatic with heart rate 100-110s.  IV diuresis with Lasix. Check 2-D echocardiogram, TSH.  Code Status: DNR/DNI  DVT prophylaxis:SCDs Family Communication: none present Disposition Plan/Anticipated LOS: admit 2 days  Time spent: 32 minutes  Murray Hodgkins, MD  [facility] Pager [phone] 07/06/2013, 4:35 PM

## 2013-07-07 DIAGNOSIS — N179 Acute kidney failure, unspecified: Secondary | ICD-10-CM | POA: Diagnosis present

## 2013-07-07 DIAGNOSIS — I059 Rheumatic mitral valve disease, unspecified: Secondary | ICD-10-CM

## 2013-07-07 DIAGNOSIS — I4891 Unspecified atrial fibrillation: Principal | ICD-10-CM

## 2013-07-07 DIAGNOSIS — I509 Heart failure, unspecified: Secondary | ICD-10-CM

## 2013-07-07 LAB — BASIC METABOLIC PANEL
BUN: 23 mg/dL (ref 6–23)
CALCIUM: 9 mg/dL (ref 8.4–10.5)
CHLORIDE: 98 meq/L (ref 96–112)
CO2: 34 mEq/L — ABNORMAL HIGH (ref 19–32)
Creatinine, Ser: 1.32 mg/dL — ABNORMAL HIGH (ref 0.50–1.10)
GFR calc Af Amer: 40 mL/min — ABNORMAL LOW (ref >90)
GFR calc non Af Amer: 34 mL/min — ABNORMAL LOW (ref >90)
GLUCOSE: 144 mg/dL — AB (ref 70–99)
POTASSIUM: 3.8 meq/L (ref 3.7–5.3)
SODIUM: 143 meq/L (ref 137–147)

## 2013-07-07 LAB — MRSA PCR SCREENING: MRSA BY PCR: NEGATIVE

## 2013-07-07 LAB — TROPONIN I

## 2013-07-07 LAB — TSH: TSH: 0.956 u[IU]/mL (ref 0.350–4.500)

## 2013-07-07 MED ORDER — ATENOLOL 25 MG PO TABS
25.0000 mg | ORAL_TABLET | Freq: Once | ORAL | Status: AC
  Administered 2013-07-07: 25 mg via ORAL
  Filled 2013-07-07: qty 1

## 2013-07-07 MED ORDER — FUROSEMIDE 40 MG PO TABS
40.0000 mg | ORAL_TABLET | Freq: Every day | ORAL | Status: DC
  Administered 2013-07-08 – 2013-07-10 (×3): 40 mg via ORAL
  Filled 2013-07-07 (×3): qty 1

## 2013-07-07 MED ORDER — ATENOLOL 25 MG PO TABS
100.0000 mg | ORAL_TABLET | Freq: Every day | ORAL | Status: DC
  Administered 2013-07-08: 100 mg via ORAL
  Filled 2013-07-07: qty 4

## 2013-07-07 NOTE — Progress Notes (Signed)
[facility] PROGRESS NOTE  Abigail [name] ZTI:[id number] DOB: 1920/02/21 DOA: 07/06/2013 PCP: Vic Blackbird, MD  Assessment/Plan: 1. Atrial fibrillation with rapid ventricular response. Compliant with medications. No chest pain. No signs or symptoms to suggest ACS. Has required IV metoprolol this am for HR 130.  At time of my exam HR 120. May increasing atenolol or add additional rate control medication. BP slightly soft so need to be mindful.  2. Dyspnea on exertion. Possible acute heart failure type unknown, likely of rate related. Much improved this am. Volume status -1.7L. Wt 55.2kg up from 54.4kg on admission.  Await echo results.  No history of systolic or diastolic dysfunction or valvular abnormalities. Oxygen saturation level 98% 2L. Only on oxygen at home at night.  3. Chronic bilateral lower extremity edema. Patient reports continued improvement. Remains bilateral 2+ LE edema 4. Chronic thrombocytopenia. 5. COPD. Appears stable at this point. No wheezing. History does not suggest exacerbation. 6. History of coronary artery disease. No recent symptoms to suggest ACS.  7. Acute renal failure: Creatinine trending up likely related to diuresis. Creatinine within normal range 2 months ago. Will transition to po lasix today. Continue to monitor.   Code Status: DNR Family Communication: none present Disposition Plan: home when ready hopefully 24-36 hours   Consultants:  none  Procedures:  echo  Antibiotics: none HPI/Subjective: Awake alert. Reports "breathing much better". Denies cp/palpitations.   Objective: Filed Vitals:   07/07/13 0841  BP:   Pulse: 130  Temp:   Resp:     Intake/Output Summary (Last 24 hours) at 07/07/13 1036 Last data filed at 07/07/13 0055  Gross per 24 hour  Intake      0 ml  Output   1750 ml  Net  -1750 ml   Filed Weights   07/06/13 1724 07/07/13 0456  Weight: 54.432 kg (120 lb) 55.248 kg (121 lb 12.8 oz)    Exam:   General:   Well nourished NAD  Cardiovascular: irregularly irregular, No MGR 2+ LE edema bilaterally   Respiratory: normal effort BS with fine crackles bilateral bases otherwise clear somewhat distant.   Abdomen: round but soft +BS non-tender to palpation  Musculoskeletal: no clubbing no cyanosis   Data Reviewed: Basic Metabolic Panel:  Recent Labs Lab 07/06/13 1350 07/07/13 0520  NA 142 143  K 4.0 3.8  CL 98 98  CO2 33* 34*  GLUCOSE 92 144*  BUN 22 23  CREATININE 1.15* 1.32*  CALCIUM 9.4 9.0   Liver Function Tests: No results found for this basename: AST, ALT, ALKPHOS, BILITOT, PROT, ALBUMIN,  in the last 168 hours No results found for this basename: LIPASE, AMYLASE,  in the last 168 hours No results found for this basename: AMMONIA,  in the last 168 hours CBC:  Recent Labs Lab 07/06/13 1350  WBC 7.4  NEUTROABS 5.7  HGB 14.5  HCT 43.5  MCV 105.8*  PLT 84*   Cardiac Enzymes:  Recent Labs Lab 07/06/13 1350 07/06/13 1735 07/06/13 2328 07/07/13 0520  TROPONINI <0.30 <0.30 <0.30 <0.30   BNP (last 3 results)  Recent Labs  09/10/12 2234 07/06/13 1350  PROBNP 1840.0* 3928.0*   CBG: No results found for this basename: GLUCAP,  in the last 168 hours  No results found for this or any previous visit (from the past 240 hour(s)).   Studies: Dg Chest 2 View  07/06/2013   CLINICAL DATA:  Shortness of Breath  EXAM: CHEST  2 VIEW  COMPARISON:  June 09, 2013  FINDINGS: There is underlying emphysematous change. There is stable interstitial prominence, prior chronic inflammatory type change. A degree of chronic congestive heart failure, however, cannot be excluded. There is no airspace consolidation.  Heart is mildly enlarged. The pulmonary vascularity is overall within normal limits, possibly slightly prominent given underlying emphysema. The appearance is stable.  There is extensive atherosclerotic change in the aorta. No adenopathy. Patient is status post kyphoplasty at T4.  Marked collapse of the T6 vertebral body is stable. There is increase in kyphosis in the mid thoracic region.  IMPRESSION: Stable chest. Underlying emphysema. Question chronic inflammatory change versus a degree of chronic congestive heart failure superimposed on emphysema. No airspace consolidation. Extensive atherosclerotic change. Collapse of the T6 vertebral body with focal kyphosis in the midthoracic region is stable.   Electronically Signed   By: Lowella Grip M.D.   On: 07/06/2013 12:43    Scheduled Meds: . atenolol  75 mg Oral Daily  . furosemide  20 mg Intravenous Q12H  . pantoprazole  40 mg Oral Daily  . sodium chloride  3 mL Intravenous Q12H   Continuous Infusions:   Principal Problem:   Atrial fibrillation with RVR Active Problems:   CAD, NATIVE VESSEL   Thrombocytopenia   COPD (chronic obstructive pulmonary disease)   Acute renal failure    Time spent: 30 minutes    [facility] Pager [phone]. If 7PM-7AM, please contact night-coverage at [email or web address], password [email or web address] 07/07/2013, 10:36 AM  LOS: 1 day

## 2013-07-07 NOTE — Progress Notes (Signed)
Utilization Review Complete  

## 2013-07-07 NOTE — Progress Notes (Signed)
Patient seen, independently examined and chart reviewed. I agree with exam, assessment and plan discussed with Dyanne Carrel, NP.  She feels better today. Still a little short of breath but better. Still has some lower extremity edema.  Afebrile, vital signs are stable. Heart rate variable, 110-130s. Asymptomatic. Appears calm and comfortable. Speech fluent and clear. Cardiovascular tachycardic, irregular. Respiratory clear to auscultation generally. Mild increased respiratory effort. No change in bilateral lower extremity edema, 1+. Hemosiderin staining bilateral lower extremities noted. No evidence of cellulitis.   Left ventricular ejection fraction 50-55%, normal wall motion. Diastolic function cannot be evaluated. Moderate to severe left atrial enlargement. Moderate RV enlargement. Troponins negative.  Overall she is improved. Per review of chart, cardiology has documented right-sided heart failure. Will continue empiric treatment for decompensated right-sided heart failure: Change to oral Lasix. Likely this is being given by her rate. Atenolol will be increased. Anticipate discharge next 48 hours. Discussed with son at bedside.   Murray Hodgkins, MD [facility] [phone]

## 2013-07-07 NOTE — Evaluation (Signed)
Physical Therapy Evaluation Abigail [name] Details Name: Abigail Abigail [name] MRN: [id number] DOB: 04-01-1920 Today's Date: 07/07/2013 Time: 6606-3016 PT Time Calculation (min): 41 min  PT Assessment / Plan / Recommendation History of Present Illness  This is a very pleasant 80 year old female with chronic Afib and COPD who was admitted with increasing dyspnea and LE edema.  She has supplemental O2 at home that she uses at night and PRN during the day.  She states that she has been needing the O2 more and more at home.  She lives alone in a retirement apartment complex and is normally independent with all ADLs, drives her own car.  Clinical Impression   Pt was seen for evaluation and found to be at functional baseline.  Her O2 sat at rest on 2 L O2 was 94% but did decrease to 88% with gait.  After several minutes of rest, O2 sat returned to baseline.  Her gait is very stable and there should not be any further PT needed.  I will ask nursing service to ambulate with her in the hallway.    PT Assessment  Patent does not need any further PT services    Follow Up Recommendations  No PT follow up    Does the Abigail [name] have the potential to tolerate intense rehabilitation      Barriers to Discharge        Equipment Recommendations  None recommended by PT    Recommendations for Other Services     Frequency      Precautions / Restrictions Precautions Precautions: None Restrictions Weight Bearing Restrictions: No   Pertinent Vitals/Pain       Mobility  Bed Mobility Overal bed mobility: Independent Transfers Overall transfer level: Independent Equipment used: None Ambulation/Gait Ambulation/Gait assistance: Modified independent (Device/Increase time) Ambulation Distance (Feet): 200 Feet Assistive device: Straight cane Gait Pattern/deviations: WFL(Within Functional Limits) Gait velocity interpretation: at or above normal speed for age/gender    Exercises     PT Diagnosis:    PT Problem  List:   PT Treatment Interventions:       PT Goals(Current goals can be found in the care plan section) Acute Rehab PT Goals PT Goal Formulation: No goals set, d/c therapy  Visit Information  Last PT Received On: 07/07/13 History of Present Illness: This is a very pleasant 80 year old female with chronic Afib and COPD who was admitted with increasing dyspnea and LE edema.  She has supplemental O2 at home that she uses at night and PRN during the day.  She states that she has been needing the O2 more and more at home.  She lives alone in a retirement apartment complex and is normally independent with all ADLs, drives her own car.       Prior [place] expects to be discharged to:: Private residence Living Arrangements: Alone Available Help at Discharge: Friend(s);Available 24 hours/day Type of Home: Apartment Home Access: Level entry Home Layout: One level Home Equipment: Walker - 2 wheels;Cane - single point;Shower seat Prior Function Level of Independence: Independent with assistive device(s) Comments: uses a cane occasionally Communication Communication: No difficulties    Cognition  Cognition Arousal/Alertness: Awake/alert Behavior During Therapy: WFL for tasks assessed/performed Overall Cognitive Status: Within Functional Limits for tasks assessed    Extremity/Trunk Assessment Lower Extremity Assessment Lower Extremity Assessment: Overall WFL for tasks assessed   Balance Balance Overall balance assessment: No apparent balance deficits (not formally assessed)  End of Session PT - End  of Session Equipment Utilized During Treatment: Gait belt Activity Tolerance: Abigail [name] Abigail [name] left: in bed;with call bell/phone within reach  GP     Demetrios Isaacs L 07/07/2013, 3:15 PM

## 2013-07-07 NOTE — Care Management Note (Addendum)
    Page 1 of 2   07/10/2013     3:05:19 PM   CARE MANAGEMENT NOTE 07/10/2013  Patient:  Abigail [name], Abigail [name]   Account Number:  [id number]  Date Initiated:  07/07/2013  Documentation initiated by:  Claretha Cooper  Subjective/Objective Assessment:   Pt lives at home alone in a senior housing complex with her [place]. Pt uses O2 at home PRN, usually at night. Her and her son requested [place]ia and Rolator.     Action/Plan:   Anticipated DC Date:  07/08/2013   Anticipated DC Plan:  [place]  CM consult      PAC Choice  [place]   Choice offered to / List presented to:  C-1 Patient   DME arranged  OTHER - SEE COMMENT      DME agency  [place] arranged  HH-1 RN  [place]      [place].   Status of service:  Completed, signed off Medicare Important Message given?   (If response is "NO", the following Medicare IM given date fields will be blank) Date Medicare IM given:   Date Additional Medicare IM given:    Discharge Disposition:  [place]  Per UR Regulation:    If discussed at Long Length of Stay Meetings, dates discussed:    Comments:  07/07/13 Claretha Cooper RN BSN CM 07/08/13 pt wiill need a Rolator at DC from [facility]. 07/10/13 Nyomi Howser robosn RN BSN CM Rolator delivered. O2 will continue to use [place] in [place].. HH with [facility]

## 2013-07-07 NOTE — Progress Notes (Signed)
*  PRELIMINARY RESULTS* Echocardiogram 2D Echocardiogram has been performed.  [name], [place] 07/07/2013, 10:11 AM

## 2013-07-08 DIAGNOSIS — J449 Chronic obstructive pulmonary disease, unspecified: Secondary | ICD-10-CM

## 2013-07-08 LAB — BASIC METABOLIC PANEL
BUN: 22 mg/dL (ref 6–23)
CALCIUM: 9.1 mg/dL (ref 8.4–10.5)
CO2: 34 mEq/L — ABNORMAL HIGH (ref 19–32)
Chloride: 97 mEq/L (ref 96–112)
Creatinine, Ser: 1.22 mg/dL — ABNORMAL HIGH (ref 0.50–1.10)
GFR calc Af Amer: 44 mL/min — ABNORMAL LOW (ref >90)
GFR, EST NON AFRICAN AMERICAN: 38 mL/min — AB (ref >90)
Glucose, Bld: 119 mg/dL — ABNORMAL HIGH (ref 70–99)
Potassium: 3.8 mEq/L (ref 3.7–5.3)
Sodium: 140 mEq/L (ref 137–147)

## 2013-07-08 MED ORDER — MOMETASONE FURO-FORMOTEROL FUM 100-5 MCG/ACT IN AERO
2.0000 | INHALATION_SPRAY | Freq: Two times a day (BID) | RESPIRATORY_TRACT | Status: DC
  Administered 2013-07-08 – 2013-07-10 (×4): 2 via RESPIRATORY_TRACT
  Filled 2013-07-08: qty 8.8

## 2013-07-08 MED ORDER — DILTIAZEM HCL 30 MG PO TABS: 30.0000 mg | ORAL_TABLET | Freq: Four times a day (QID) | ORAL | Status: DC | PRN

## 2013-07-08 MED ORDER — METOPROLOL TARTRATE 50 MG PO TABS
100.0000 mg | ORAL_TABLET | Freq: Two times a day (BID) | ORAL | Status: DC
  Administered 2013-07-09 – 2013-07-10 (×3): 100 mg via ORAL
  Filled 2013-07-08 (×4): qty 2
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
## 2013-07-08 NOTE — Progress Notes (Signed)
Pt ambulated in hallway with cane and standby assist from NT. Pt ambulated approximately 200 feet. Tolerated well. No complaints at this time.

## 2013-07-08 NOTE — Progress Notes (Signed)
Patient seen, independently examined and chart reviewed. I agree with exam, assessment and plan discussed with Abigail Carrel, Abigail [name].  She feels a little more short of breath today. Lower extremity edema without change. No pain.  Afebrile, mild tachycardia. Blood pressure stable. Respiratory rate stable. Cardiovascular irregular, tachycardic. No murmur, rub or gallop. Perhaps mild improvement in 2+ bilateral lower extremity edema. Respiratory clear to auscultation bilaterally. Mild increased respiratory effort.  Weight down approximately 2 kg. Creatinine stable. -2 L since admission.  Rate remains poorly controlled and is likely driving lower extremity edema. Her chart lists both Cardizem and metoprolol causing "low pulse". I discussed her case with her cardiologist Dr. Harl Bowie by telephone today. Given poor rate control with atenolol, he recommended starting metoprolol twice a day and considering Cardizem as a second agent if needed.  We will initiate metoprolol and monitor her rate, he is Cardizem when necessary and if requires frequently, schedule. Her heart failure appears to be stable at this point and is likely driven by her rapid rate. Discussed with multiple family members at bedside including son. Anticipate discharge next 48 hours.  Murray Hodgkins, MD [facility] [phone]

## 2013-07-08 NOTE — Progress Notes (Signed)
[facility] PROGRESS NOTE  SHAKINA CHOY IOE:[id number] DOB: 1919/11/29 DOA: 07/06/2013 PCP: Vic Blackbird, MD  Assessment/Plan: 1. Atrial fibrillation with rapid ventricular response. Compliant with medications. No chest pain. No signs or symptoms to suggest ACS. Poor control. Discussed with Dr. Harl Bowie. Will discontinue atenolol and IV metoprolol. Will start po metoprolol tomorrow and provide cardizem prn po. Will monitor closely  2. Acute right sided heart failure likely multifactorial with rate contributing to decompensation. Volume status -1.6L. Wt 52.7kg down from 54.4kg on admission. Eecho with EF 55%, normal wall motion, moderate to severe left atrial enlargement. Moderate RV enlargement. Troponin negative.  Oxygen saturation level 96% 2L. Only on oxygen at home at night.continue po lasix. Monitor closely  3. Chronic bilateral lower extremity edema.  Remains bilateral 2+ LE edema 4. Chronic thrombocytopenia. 5. COPD. Appears stable at this point. No wheezing. History does not suggest exacerbation. 6. History of coronary artery disease. No recent symptoms to suggest ACS.  7. Acute renal failure: Creatinine trending down today. Likely related to lasix.  Creatinine within normal range 2 months ago.  Continue to monitor.   Code Status: DNR Family Communication: son at bedside Disposition Plan: home when clinically indicated. Hopefully 24-48 hours   Consultants:  none  Procedures:  Echo   Antibiotics:  none  HPI/Subjective: Awake alert. Reports continued sob but"gets it back when i rest"  Objective: Filed Vitals:   07/08/13 0823  BP:   Pulse: 125  Temp:   Resp:     Intake/Output Summary (Last 24 hours) at 07/08/13 1058 Last data filed at 07/08/13 1041  Gross per 24 hour  Intake    480 ml  Output    800 ml  Net   -320 ml   Filed Weights   07/06/13 1724 07/07/13 0456 07/08/13 0456  Weight: 54.432 kg (120 lb) 55.248 kg (121 lb 12.8 oz) 52.7 kg (116 lb 2.9 oz)     Exam:   General:  Somewhat frail appearing NAD  Cardiovascular: irregularly irregular. No MGR 1-2+LE edema  Respiratory: mild increased work of breathing. BS clear bilaterally no wheeze fair air flow  Abdomen: soft non-distended +BS non-tender to palpation  Musculoskeletal: no clubbing or cyanosis   Data Reviewed: Basic Metabolic Panel:  Recent Labs Lab 07/06/13 1350 07/07/13 0520 07/08/13 0606  NA 142 143 140  K 4.0 3.8 3.8  CL 98 98 97  CO2 33* 34* 34*  GLUCOSE 92 144* 119*  BUN 22 23 22   CREATININE 1.15* 1.32* 1.22*  CALCIUM 9.4 9.0 9.1   Liver Function Tests: No results found for this basename: AST, ALT, ALKPHOS, BILITOT, PROT, ALBUMIN,  in the last 168 hours No results found for this basename: LIPASE, AMYLASE,  in the last 168 hours No results found for this basename: AMMONIA,  in the last 168 hours CBC:  Recent Labs Lab 07/06/13 1350  WBC 7.4  NEUTROABS 5.7  HGB 14.5  HCT 43.5  MCV 105.8*  PLT 84*   Cardiac Enzymes:  Recent Labs Lab 07/06/13 1350 07/06/13 1735 07/06/13 2328 07/07/13 0520  TROPONINI <0.30 <0.30 <0.30 <0.30   BNP (last 3 results)  Recent Labs  09/10/12 2234 07/06/13 1350  PROBNP 1840.0* 3928.0*   CBG: No results found for this basename: GLUCAP,  in the last 168 hours  Recent Results (from the past 240 hour(s))  MRSA PCR SCREENING     Status: None   Collection Time    07/07/13  9:00 AM  Result Value Range Status   MRSA by PCR NEGATIVE  NEGATIVE Final   Comment:            The GeneXpert MRSA Assay (FDA     approved for NASAL specimens     only), is one component of a     comprehensive MRSA colonization     surveillance program. It is not     intended to diagnose MRSA     infection nor to guide or     monitor treatment for     MRSA infections.     Studies: Dg Chest 2 View  07/06/2013   CLINICAL DATA:  Shortness of Breath  EXAM: CHEST  2 VIEW  COMPARISON:  June 09, 2013  FINDINGS: There is underlying  emphysematous change. There is stable interstitial prominence, prior chronic inflammatory type change. A degree of chronic congestive heart failure, however, cannot be excluded. There is no airspace consolidation.  Heart is mildly enlarged. The pulmonary vascularity is overall within normal limits, possibly slightly prominent given underlying emphysema. The appearance is stable.  There is extensive atherosclerotic change in the aorta. No adenopathy. Patient is status post kyphoplasty at T4. Marked collapse of the T6 vertebral body is stable. There is increase in kyphosis in the mid thoracic region.  IMPRESSION: Stable chest. Underlying emphysema. Question chronic inflammatory change versus a degree of chronic congestive heart failure superimposed on emphysema. No airspace consolidation. Extensive atherosclerotic change. Collapse of the T6 vertebral body with focal kyphosis in the midthoracic region is stable.   Electronically Signed   By: Lowella Grip M.D.   On: 07/06/2013 12:43    Scheduled Meds: . furosemide  40 mg Oral Daily  . [START ON 07/09/2013] metoprolol tartrate  100 mg Oral BID  . pantoprazole  40 mg Oral Daily  . sodium chloride  3 mL Intravenous Q12H   Continuous Infusions:   Principal Problem:   Atrial fibrillation with RVR Active Problems:   CAD, NATIVE VESSEL   Thrombocytopenia   COPD (chronic obstructive pulmonary disease)   Acute renal failure    Time spent: 35 minutes    [facility] Pager [phone]. If 7PM-7AM, please contact night-coverage at [email or web address], password [facility] 07/08/2013, 10:58 AM  LOS: 2 days

## 2013-07-09 DIAGNOSIS — N179 Acute kidney failure, unspecified: Secondary | ICD-10-CM

## 2013-07-09 DIAGNOSIS — I10 Essential (primary) hypertension: Secondary | ICD-10-CM

## 2013-07-09 DIAGNOSIS — E785 Hyperlipidemia, unspecified: Secondary | ICD-10-CM

## 2013-07-09 DIAGNOSIS — M549 Dorsalgia, unspecified: Secondary | ICD-10-CM

## 2013-07-09 LAB — BASIC METABOLIC PANEL
BUN: 20 mg/dL (ref 6–23)
CALCIUM: 8.9 mg/dL (ref 8.4–10.5)
CHLORIDE: 101 meq/L (ref 96–112)
CO2: 35 mEq/L — ABNORMAL HIGH (ref 19–32)
CREATININE: 1.03 mg/dL (ref 0.50–1.10)
GFR calc Af Amer: 53 mL/min — ABNORMAL LOW (ref >90)
GFR calc non Af Amer: 46 mL/min — ABNORMAL LOW (ref >90)
GLUCOSE: 114 mg/dL — AB (ref 70–99)
Potassium: 3.8 mEq/L (ref 3.7–5.3)
Sodium: 143 mEq/L (ref 137–147)

## 2013-07-09 NOTE — Progress Notes (Signed)
Patient seen, independently examined and chart reviewed. I agree with exam, assessment and plan discussed with Karen Black, NP.     

## 2013-07-09 NOTE — Progress Notes (Signed)
[facility] PROGRESS NOTE  Abigail [name] JXB:[id number] DOB: Aug 29, 1919 DOA: 07/06/2013 PCP: Vic Blackbird, MD  Assessment/Plan: 1. Atrial fibrillation with rapid ventricular response. Compliant with medications. No chest pain. No signs or symptoms to suggest ACS. Control remains difficult. Discussed with Dr. Harl Bowie. Will discontinue atenolol and IV metoprolol. Will start po metoprolol today and provide cardizem prn po. Will monitor closely  2. Acute right sided heart failure likely multifactorial with rate contributing to decompensation. Volume status -2.0L. Wt 53.0kg down from 54.4kg on admission. Eecho with EF 55%, normal wall motion, moderate to severe left atrial enlargement. Moderate RV enlargement. Troponin negative. Oxygen saturation level 96% 2L. Only on oxygen at home at night.continue po lasix. Monitor closely  3. Chronic bilateral lower extremity edema. Remains bilateral 2+ LE edema 4. Chronic thrombocytopenia. 5. COPD. Appears stable at this point. No wheezing. History does not suggest exacerbation. 6. History of coronary artery disease. No recent symptoms to suggest ACS.  7. Acute renal failure: Creatinine within normal limits today. Likely related to lasix Creatinine within normal range 2 months ago. Continue to monitor.   Code Status: DNR Family Communication:  Disposition Plan: home hopefully tomorrow   Consultants:  none  Procedures:  echo  Antibiotics:  none  HPI/Subjective: Reports feeling a little better  Objective: Filed Vitals:   07/09/13 0609  BP: 114/55  Pulse: 98  Temp:   Resp: 20    Intake/Output Summary (Last 24 hours) at 07/09/13 0904 Last data filed at 07/09/13 0500  Gross per 24 hour  Intake    480 ml  Output   1000 ml  Net   -520 ml   Filed Weights   07/08/13 0456 07/08/13 1922 07/09/13 0609  Weight: 52.7 kg (116 lb 2.9 oz) 53.524 kg (118 lb) 53.071 kg (117 lb)    Exam:   General:  Somewhat frail but  NAD  Cardiovascular: irregularly irregular  Respiratory: normal effort BS clear bilaterally no crackles  Abdomen: soft +BS non-tender to palpation  Musculoskeletal: no clubbing or cyanosis. LE with venous changes to skin   Data Reviewed: Basic Metabolic Panel:  Recent Labs Lab 07/06/13 1350 07/07/13 0520 07/08/13 0606 07/09/13 0538  NA 142 143 140 143  K 4.0 3.8 3.8 3.8  CL 98 98 97 101  CO2 33* 34* 34* 35*  GLUCOSE 92 144* 119* 114*  BUN 22 23 22 20   CREATININE 1.15* 1.32* 1.22* 1.03  CALCIUM 9.4 9.0 9.1 8.9   Liver Function Tests: No results found for this basename: AST, ALT, ALKPHOS, BILITOT, PROT, ALBUMIN,  in the last 168 hours No results found for this basename: LIPASE, AMYLASE,  in the last 168 hours No results found for this basename: AMMONIA,  in the last 168 hours CBC:  Recent Labs Lab 07/06/13 1350  WBC 7.4  NEUTROABS 5.7  HGB 14.5  HCT 43.5  MCV 105.8*  PLT 84*   Cardiac Enzymes:  Recent Labs Lab 07/06/13 1350 07/06/13 1735 07/06/13 2328 07/07/13 0520  TROPONINI <0.30 <0.30 <0.30 <0.30   BNP (last 3 results)  Recent Labs  09/10/12 2234 07/06/13 1350  PROBNP 1840.0* 3928.0*   CBG: No results found for this basename: GLUCAP,  in the last 168 hours  Recent Results (from the past 240 hour(s))  MRSA PCR SCREENING     Status: None   Collection Time    07/07/13  9:00 AM      Result Value Ref Range Status   MRSA by PCR NEGATIVE  NEGATIVE  Final   Comment:            The GeneXpert MRSA Assay (FDA     approved for NASAL specimens     only), is one component of a     comprehensive MRSA colonization     surveillance program. It is not     intended to diagnose MRSA     infection nor to guide or     monitor treatment for     MRSA infections.     Studies: No results found.  Scheduled Meds: . furosemide  40 mg Oral Daily  . metoprolol tartrate  100 mg Oral BID  . mometasone-formoterol  2 puff Inhalation BID  . pantoprazole  40 mg  Oral Daily  . sodium chloride  3 mL Intravenous Q12H   Continuous Infusions:   Principal Problem:   Atrial fibrillation with RVR Active Problems:   CAD, NATIVE VESSEL   Thrombocytopenia   COPD (chronic obstructive pulmonary disease)   Acute renal failure    Time spent: 30 minutes    [facility] Pager [phone]. If 7PM-7AM, please contact night-coverage at [email or web address], password [facility] 07/09/2013, 9:04 AM  LOS: 3 days

## 2013-07-10 DIAGNOSIS — J9600 Acute respiratory failure, unspecified whether with hypoxia or hypercapnia: Secondary | ICD-10-CM

## 2013-07-10 DIAGNOSIS — S8011XA Contusion of right lower leg, initial encounter: Secondary | ICD-10-CM | POA: Diagnosis not present

## 2013-07-10 DIAGNOSIS — J9601 Acute respiratory failure with hypoxia: Secondary | ICD-10-CM | POA: Diagnosis not present

## 2013-07-10 DIAGNOSIS — S8010XA Contusion of unspecified lower leg, initial encounter: Secondary | ICD-10-CM

## 2013-07-10 DIAGNOSIS — J209 Acute bronchitis, unspecified: Secondary | ICD-10-CM

## 2013-07-10 MED ORDER — FUROSEMIDE 20 MG PO TABS
40.0000 mg | ORAL_TABLET | Freq: Every day | ORAL | Status: AC
Start: 2013-07-10 — End: 2014-07-10

## 2013-07-10 MED ORDER — MOMETASONE FURO-FORMOTEROL FUM 100-5 MCG/ACT IN AERO: 2.0000 | INHALATION_SPRAY | Freq: Two times a day (BID) | RESPIRATORY_TRACT | Status: DC

## 2013-07-10 MED ORDER — METOPROLOL TARTRATE 100 MG PO TABS: 100.0000 mg | ORAL_TABLET | Freq: Two times a day (BID) | ORAL | Status: DC

## 2013-07-10 NOTE — Progress Notes (Signed)
D/c instructions reviewed with patient and son.  Verbalized understanding. Pt dc'd to home with son. Schonewitz, Eulis Canner 07/10/2013

## 2013-07-10 NOTE — Progress Notes (Signed)
Found patient resting in chair, finished with breakfast. Reassessed oxygen saturation on room air and patient was 79%, no acute distress.  Replaced oxygen at 3lpm and patient rebounded to 94%.  Will continue to monitor patient.  Schonewitz, Eulis Canner 07/10/2013

## 2013-07-10 NOTE — Discharge Summary (Signed)
I have directly reviewed the clinical findings, lab, imaging studies and management of this patient in detail. I have interviewed and examined the patient and agree with the documentation,  as recorded by Ms. Dyanne Carrel.    Cristal Ford M.D on 07/10/2013 at 12:45 PM  [facility] Office  [phone]

## 2013-07-10 NOTE — Discharge Summary (Signed)
Physician Discharge Summary  Abigail [name] [id number] DOB: 07-26-1919 DOA: 07/06/2013  PCP: Vic Blackbird, MD  Admit date: 07/06/2013 Discharge date: 07/10/2013  Time spent: 45 minutes  Recommendations for Outpatient Follow-up:  1. PCP 1 week for evaluation of symptoms. Recommend BP and HR check. Recommend BMET to track renal function. Recommend monitoring of hematoma on right lower leg. 2. Appointment with Jory Sims 2/26 with cardiology  Discharge Diagnoses:  Principal Problem:   Atrial fibrillation with RVR Active Problems:   CAD, NATIVE VESSEL   Thrombocytopenia   COPD (chronic obstructive pulmonary disease)   Acute renal failure   Acute respiratory failure with hypoxia   Traumatic hematoma of right lower leg   Discharge Condition: stable  Diet recommendation: heart healthy  Filed Weights   07/08/13 1922 07/09/13 0609 07/10/13 0608  Weight: 53.524 kg (118 lb) 53.071 kg (117 lb) 53.1 kg (117 lb 1 oz)    History of present illness:  80 year old followed by cardiology for history of coronary artery disease, atrial fibrillation (not on anticoagulation at this time pending record review) presented to the emergency department on 07/06/13 with several day history of increasing shortness of breath. Initial evaluation notable for atrial fibrillation with rapid ventricular response, lower extremity edema and acute heart failure was considered.  Patient has a history of lower extremity edema which had been treated with Lasix for some time now. Last echocardiogram on file 05/2012 with normal LVEF 55-60%. Normal wall motion. No comment on diastolic function. No significant other abnormalities were noted at that time. She is compliant with her medications including diuretics and beta blocker. The last several days she  had increasing lower extremity edema and increasing shortness of breath which somewhat improved with rest but it had been unresponsive to breathing treatments. She had no  orthopnea but because of worsening shortness of breath she came to the emergency department. She  had no chest pain.  In the emergency department afebrile. Heart rate up to 130s. SpO2 high 90s on 2 L (chronic respiratory failure). Basic metabolic panel unremarkable. Troponin negative. BNP 3928. CBC notable for thrombocytopenia, chronic finding. Chest x-Aikey suggested underlying emphysema, chronic congestive heart failure. No airspace consolidation. EKG independently reviewed showed atrial fibrillation with rapid ventricular response, right bundle branch block. Compared to previous study 09/12/2012 no acute changes were seen  Hospital Course:  1. Atrial fibrillation with rapid ventricular response. Compliant with medications. No chest pain. No signs or symptoms to suggest ACS. Control remained difficult. Admitted and continued home atenolol with IV metoprolol prn.  Discussed with Dr. Harl Bowie. Discontinued atenolol and IV metoprolol. Started po metoprolol 07/09/13 with improved HR. Provided cardizem po prn but this was not needed. HR on discharge range  95-101. Has follow up with Jory Sims 2/26.  2. Acute right sided heart failure likely multifactorial with rate contributing to decompensation. Volume status -2.0L. Wt 53.0kg down from 54.4kg on admission. Eecho with EF 55%, normal wall motion, moderate to severe left atrial enlargement. Moderate RV enlargement. Troponin negative. Oxygen saturation level on room air 79%. Likely some component of chronic respiratory failure as well. Oxygen saturation level 94% 2L. Will discharge on home oxygen continuous.  Only on oxygen at home at night. Continue po lasix. 3. Chronic bilateral lower extremity edema. Much improved at discharge. 4. Chronic thrombocytopenia. Stable during this hospitalization 5. COPD. remained stable during this hospitalization.  No wheezing. History does not suggest exacerbation. 6. History of coronary artery disease. No recent symptoms to  suggest ACS.  7. Acute renal failure: Creatinine within normal limits at discharge. Likely related to lasix. Creatinine within normal range 2 months ago.    Procedures:  Echo as above  Consultations:  none  Discharge Exam: Filed Vitals:   07/10/13 0618  BP: 117/68  Pulse: 103  Temp: 97.2 F (36.2 C)  Resp: 20    General: well nourished NAD ambulating in room Cardiovascular: irregularly irregular Respiratory: normal effort BS clear bilaterally no crackles MS: no clubbing or cyanosis. LE with venous changes. Right LE at lateral shin moderate sized hematoma. Skin intact without erythema  Discharge Instructions      Discharge Orders   Future Appointments Provider Department Dept Phone   07/24/2013 3:50 PM Lendon Colonel, NP [facility] [place] [place] [phone]   08/12/2013 9:15 AM Alycia Rossetti, MD [facility] [phone]   Future Orders Complete By Expires   Diet - low sodium heart healthy  As directed    Discharge instructions  As directed    Comments:     Take medication as directed.  See Cardiologist 2/26 as scheduled   Increase activity slowly  As directed        Medication List    STOP taking these medications       atenolol 25 MG tablet  Commonly known as:  TENORMIN     atenolol 50 MG tablet  Commonly known as:  TENORMIN      TAKE these medications       aspirin 81 MG tablet  Take 81 mg by mouth every morning.     Calcium 1500 MG tablet  Take 1,500 mg by mouth every morning.     CENTRUM SILVER PO  Take 1 tablet by mouth daily.     cholecalciferol 1000 UNITS tablet  Commonly known as:  VITAMIN D  Take 1,000 Units by mouth daily.     CRANBERRY PO  Take 1 tablet by mouth 2 (two) times daily.     Fish Oil 1000 MG Caps  Take 1 capsule by mouth 2 (two) times daily.     Fluticasone-Salmeterol 250-50 MCG/DOSE Aepb  Commonly known as:  ADVAIR DISKUS  Inhale 1 puff into the lungs 2 (two) times daily.     furosemide 20  MG tablet  Commonly known as:  LASIX  Take 2 tablets (40 mg total) by mouth daily.     GRAPE SEED PO  Take 1 capsule by mouth 2 (two) times daily.     IRON PO  Take 0.5 tablets by mouth daily.     levalbuterol 45 MCG/ACT inhaler  Commonly known as:  XOPENEX HFA  Inhale 2 puffs into the lungs every 6 (six) hours as needed for wheezing.     metoprolol 100 MG tablet  Commonly known as:  LOPRESSOR  Take 1 tablet (100 mg total) by mouth 2 (two) times daily.     nitroGLYCERIN 0.4 MG SL tablet  Commonly known as:  NITROSTAT  Place one under tongue every 5 minutes x 3 as needed     potassium chloride 10 MEQ tablet  Commonly known as:  K-DUR  Take 1 tablet (10 mEq total) by mouth once.     PRILOSEC 20 MG capsule  Generic drug:  omeprazole  Take 20 mg by mouth every morning.     VITAMIN B 12 PO  Take 500 mcg by mouth daily.       Allergies  Allergen Reactions  . Budesonide-Formoterol Fumarate Other (See Comments)  REACTION: STATES FEELS AS IF HAVING HEART ATTACK  . Sulfa Antibiotics Other (See Comments)    "just doesn't agree with me"  . Sulfonamide Derivatives Other (See Comments)    REACTION: unknown  . Diltiazem Other (See Comments)    Low pulse  . Metoprolol Other (See Comments)    Low pulse   Follow-up Information   Follow up with [facility]. (RN, PT, OT, Aide, DME Rolator)    Contact information:   [address] [place] [address] [phone]      Follow up with Jory Sims, NP On 07/24/2013. (appointment at 3:50pm)    Specialty:  Nurse Practitioner   Contact information:   [place] [address] [phone]       Follow up with Vic Blackbird, MD. Schedule an appointment as soon as possible for a visit in 1 week. (for evaluation of symptoms. recommend BP and HR evaluation. )    Specialty:  Family Medicine   Contact information:   [address] [place] [address] [phone]        The results of  significant diagnostics from this hospitalization (including imaging, microbiology, ancillary and laboratory) are listed below for reference.    Significant Diagnostic Studies: Dg Chest 2 View  07/06/2013   CLINICAL DATA:  Shortness of Breath  EXAM: CHEST  2 VIEW  COMPARISON:  June 09, 2013  FINDINGS: There is underlying emphysematous change. There is stable interstitial prominence, prior chronic inflammatory type change. A degree of chronic congestive heart failure, however, cannot be excluded. There is no airspace consolidation.  Heart is mildly enlarged. The pulmonary vascularity is overall within normal limits, possibly slightly prominent given underlying emphysema. The appearance is stable.  There is extensive atherosclerotic change in the aorta. No adenopathy. Patient is status post kyphoplasty at T4. Marked collapse of the T6 vertebral body is stable. There is increase in kyphosis in the mid thoracic region.  IMPRESSION: Stable chest. Underlying emphysema. Question chronic inflammatory change versus a degree of chronic congestive heart failure superimposed on emphysema. No airspace consolidation. Extensive atherosclerotic change. Collapse of the T6 vertebral body with focal kyphosis in the midthoracic region is stable.   Electronically Signed   By: Lowella Grip M.D.   On: 07/06/2013 12:43    Microbiology: Recent Results (from the past 240 hour(s))  MRSA PCR SCREENING     Status: None   Collection Time    07/07/13  9:00 AM      Result Value Ref Range Status   MRSA by PCR NEGATIVE  NEGATIVE Final   Comment:            The GeneXpert MRSA Assay (FDA     approved for NASAL specimens     only), is one component of a     comprehensive MRSA colonization     surveillance program. It is not     intended to diagnose MRSA     infection nor to guide or     monitor treatment for     MRSA infections.     Labs: Basic Metabolic Panel:  Recent Labs Lab 07/06/13 1350 07/07/13 0520  07/08/13 0606 07/09/13 0538  NA 142 143 140 143  K 4.0 3.8 3.8 3.8  CL 98 98 97 101  CO2 33* 34* 34* 35*  GLUCOSE 92 144* 119* 114*  BUN 22 23 22 20   CREATININE 1.15* 1.32* 1.22* 1.03  CALCIUM 9.4 9.0 9.1 8.9  Liver Function Tests: No results found for this basename: AST, ALT, ALKPHOS, BILITOT, PROT, ALBUMIN,  in the last 168 hours No results found for this basename: LIPASE, AMYLASE,  in the last 168 hours No results found for this basename: AMMONIA,  in the last 168 hours CBC:  Recent Labs Lab 07/06/13 1350  WBC 7.4  NEUTROABS 5.7  HGB 14.5  HCT 43.5  MCV 105.8*  PLT 84*   Cardiac Enzymes:  Recent Labs Lab 07/06/13 1350 07/06/13 1735 07/06/13 2328 07/07/13 0520  TROPONINI <0.30 <0.30 <0.30 <0.30   BNP: BNP (last 3 results)  Recent Labs  09/10/12 2234 07/06/13 1350  PROBNP 1840.0* 3928.0*   CBG: No results found for this basename: GLUCAP,  in the last 168 hours     Signed:  Radene Gunning  [facility] 07/10/2013, 11:38 AM

## 2013-07-10 NOTE — Progress Notes (Signed)
Removed oxygen for trial run on room air.  Patient resting comfortably in chair eating breakfast at this time. Will reassess oxygen saturation in 15-20 minutes.  Schonewitz, Eulis Canner 07/10/2013

## 2013-07-11 ENCOUNTER — Telehealth: Payer: Self-pay | Admitting: *Deleted

## 2013-07-11 NOTE — Telephone Encounter (Signed)
Take the metoprolol, she was give this in the hospital so she can continue it

## 2013-07-11 NOTE — Telephone Encounter (Signed)
atenolol 25mg  and 50mg  on both

## 2013-07-11 NOTE — Telephone Encounter (Signed)
Judson Roch called stating that pt had just got out of hospital yesterday and hospital had stopped pt from taking the atenol and put her on metorprolol but states the metoprolol pt has allergie to it(low pulse), Judson Roch wants to know if you want her to stay on metoprolol start back on the atenolol.

## 2013-07-16 ENCOUNTER — Encounter: Payer: Self-pay | Admitting: Family Medicine

## 2013-07-16 ENCOUNTER — Ambulatory Visit (INDEPENDENT_AMBULATORY_CARE_PROVIDER_SITE_OTHER): Payer: Medicare Other | Admitting: Family Medicine

## 2013-07-16 VITALS — BP 100/60 | HR 78 | Temp 97.9°F | Resp 18 | Wt 118.0 lb

## 2013-07-16 DIAGNOSIS — R2681 Unsteadiness on feet: Secondary | ICD-10-CM

## 2013-07-16 DIAGNOSIS — J449 Chronic obstructive pulmonary disease, unspecified: Secondary | ICD-10-CM

## 2013-07-16 DIAGNOSIS — R269 Unspecified abnormalities of gait and mobility: Secondary | ICD-10-CM

## 2013-07-16 DIAGNOSIS — S8011XA Contusion of right lower leg, initial encounter: Secondary | ICD-10-CM

## 2013-07-16 DIAGNOSIS — I4891 Unspecified atrial fibrillation: Secondary | ICD-10-CM

## 2013-07-16 DIAGNOSIS — R609 Edema, unspecified: Secondary | ICD-10-CM

## 2013-07-16 DIAGNOSIS — M5136 Other intervertebral disc degeneration, lumbar region: Secondary | ICD-10-CM

## 2013-07-16 DIAGNOSIS — I2510 Atherosclerotic heart disease of native coronary artery without angina pectoris: Secondary | ICD-10-CM

## 2013-07-16 DIAGNOSIS — M5137 Other intervertebral disc degeneration, lumbosacral region: Secondary | ICD-10-CM

## 2013-07-16 DIAGNOSIS — I10 Essential (primary) hypertension: Secondary | ICD-10-CM

## 2013-07-16 DIAGNOSIS — D696 Thrombocytopenia, unspecified: Secondary | ICD-10-CM

## 2013-07-16 DIAGNOSIS — S8010XA Contusion of unspecified lower leg, initial encounter: Secondary | ICD-10-CM

## 2013-07-16 LAB — CBC WITH DIFFERENTIAL/PLATELET
BASOS PCT: 0 % (ref 0–1)
Basophils Absolute: 0 10*3/uL (ref 0.0–0.1)
Eosinophils Absolute: 0.1 10*3/uL (ref 0.0–0.7)
Eosinophils Relative: 1 % (ref 0–5)
HEMATOCRIT: 43.4 % (ref 36.0–46.0)
Hemoglobin: 14 g/dL (ref 12.0–15.0)
Lymphocytes Relative: 18 % (ref 12–46)
Lymphs Abs: 1.2 10*3/uL (ref 0.7–4.0)
MCH: 33.7 pg (ref 26.0–34.0)
MCHC: 32.3 g/dL (ref 30.0–36.0)
MCV: 104.6 fL — ABNORMAL HIGH (ref 78.0–100.0)
MONO ABS: 0.7 10*3/uL (ref 0.1–1.0)
Monocytes Relative: 11 % (ref 3–12)
NEUTROS ABS: 4.8 10*3/uL (ref 1.7–7.7)
Neutrophils Relative %: 70 % (ref 43–77)
PLATELETS: 139 10*3/uL — AB (ref 150–400)
RBC: 4.15 MIL/uL (ref 3.87–5.11)
RDW: 14.7 % (ref 11.5–15.5)
WBC: 6.8 10*3/uL (ref 4.0–10.5)

## 2013-07-16 LAB — BASIC METABOLIC PANEL
BUN: 22 mg/dL (ref 6–23)
CO2: 37 mEq/L — ABNORMAL HIGH (ref 19–32)
CREATININE: 1.17 mg/dL — AB (ref 0.50–1.10)
Calcium: 9.2 mg/dL (ref 8.4–10.5)
Chloride: 96 mEq/L (ref 96–112)
GLUCOSE: 170 mg/dL — AB (ref 70–99)
Potassium: 4.7 mEq/L (ref 3.5–5.3)
Sodium: 140 mEq/L (ref 135–145)

## 2013-07-16 NOTE — Progress Notes (Signed)
Patient ID: Abigail [name], female   DOB: June 28, 1919, 80 y.o.   MRN: [id number]   Subjective:    Patient ID: Abigail [name], female    DOB: 04-09-1920, 80 y.o.   MRN: [id number]  Patient presents for Hospitalization Follow-up  she was recently admitted for A. fib with RVR. She was transitioned from IV metoprolol to by mouth metoprolol however had episode of shortness of breath with this medication which she say she has had in the past therefore she stopped it on back to her atenolol to 75 mg. She's to wear her oxygen 24 hours a day which helps. She's here today with her son who plans to transition her into [place] assisted-living facility/skilled nursing facility. Her discharge summary was reviewed she did sustain a traumatic hematoma to her right lower leg after a lunch tray in the hospital hit her leg. Is not had any drainage from the leg but still has significant swelling. Her son request a lift chair to help her with transitioning from a recliner.  [facility] and [facility] are following pt    Review Of Systems:  GEN- denies fatigue, fever, weight loss,weakness, recent illness HEENT- denies eye drainage, change in vision, nasal discharge, CVS- denies chest pain, palpitations RESP- denies SOB, cough, wheeze ABD- denies N/V, change in stools, abd pain GU- denies dysuria, hematuria, dribbling, incontinence MSK- + joint pain, muscle aches, injury Neuro- denies headache, dizziness, syncope, seizure activity       Objective:    BP 100/60  Pulse 78  Temp(Src) 97.9 F (36.6 C) (Oral)  Resp 18  Wt 118 lb (53.524 kg) GEN- NAD, alert and oriented x3 HEENT- PERRL, EOMI, non injected sclera, pink conjunctiva, MMM, oropharynx clear Neck- Supple,  CVS- irregular rhythm, normal rate, no murmur RESP-CTAB ABD-NABS,soft,NT,ND EXT- 1+ pitting edema bilat - hematoma to right shin, ecchymosis surrounding, no drainage, no warmth Pulses- Radial 2+        Assessment & Plan:      Problem List Items  Addressed This Visit   Traumatic hematoma of right lower leg     Hematoma slowly resolving, no break in skin Expect a few weeks until resolved    Thrombocytopenia     Recheck PLT    Peripheral edema     Increase lasix, total 60mg  daily with K for next week Recheck by [facility]    HYPERTENSION, BENIGN - Primary   Relevant Medications      atenolol (TENORMIN) 25 MG tablet      atenolol (TENORMIN) 50 MG tablet   Other Relevant Orders      Basic metabolic panel      CBC with Differential   Gait instability   DDD (degenerative disc disease), lumbar     Lift chair given I think she benefit from ALF/SNF paperwork to be completed    COPD     At baseline continue oxygen therapy    Atrial fibrillation     Continue atenolol 75mg , did not tolerate metoprolol ASA due to high risk falls         Note: This dictation was prepared with Dragon dictation along with smaller phrase technology. Any transcriptional errors that result from this process are unintentional.

## 2013-07-16 NOTE — Assessment & Plan Note (Signed)
At baseline continue oxygen therapy

## 2013-07-16 NOTE — Patient Instructions (Signed)
Take lasix 2 tablets in morning with potassium and 1 tablet in evening ( 5pm) with potassium Continue atenolol We will call with lab results Okay to pick up papers in the morning I will send [facility] to your home next week for a recheck on pressure and leg swelling F/U as previous

## 2013-07-16 NOTE — Assessment & Plan Note (Signed)
Recheck PLT

## 2013-07-16 NOTE — Assessment & Plan Note (Signed)
Hematoma slowly resolving, no break in skin Expect a few weeks until resolved

## 2013-07-16 NOTE — Assessment & Plan Note (Signed)
Continue atenolol 75mg , did not tolerate metoprolol ASA due to high risk falls

## 2013-07-16 NOTE — Assessment & Plan Note (Signed)
Lift chair given I think she benefit from ALF/SNF paperwork to be completed

## 2013-07-16 NOTE — Assessment & Plan Note (Signed)
Increase lasix, total 60mg  daily with K for next week Recheck by [facility]

## 2013-07-21 ENCOUNTER — Ambulatory Visit (INDEPENDENT_AMBULATORY_CARE_PROVIDER_SITE_OTHER): Payer: Medicare Other | Admitting: *Deleted

## 2013-07-21 DIAGNOSIS — Z111 Encounter for screening for respiratory tuberculosis: Secondary | ICD-10-CM

## 2013-07-23 ENCOUNTER — Telehealth: Payer: Self-pay | Admitting: *Deleted

## 2013-07-23 ENCOUNTER — Other Ambulatory Visit: Payer: Self-pay | Admitting: *Deleted

## 2013-07-23 ENCOUNTER — Encounter: Payer: Self-pay | Admitting: *Deleted

## 2013-07-23 ENCOUNTER — Ambulatory Visit: Payer: Medicare Other | Admitting: *Deleted

## 2013-07-23 VITALS — BP 128/84 | HR 86 | Temp 97.6°F | Resp 20 | Ht 61.5 in | Wt 121.0 lb

## 2013-07-23 DIAGNOSIS — Z111 Encounter for screening for respiratory tuberculosis: Secondary | ICD-10-CM

## 2013-07-23 LAB — TB SKIN TEST
Induration: 0 mm
TB Skin Test: NEGATIVE

## 2013-07-23 MED ORDER — POTASSIUM CHLORIDE ER 10 MEQ PO TBCR: 10.0000 meq | EXTENDED_RELEASE_TABLET | Freq: Once | ORAL | Status: AC

## 2013-07-23 NOTE — Progress Notes (Signed)
Patient ID: Abigail [name], female   DOB: [birth year], 80 y.o.   MRN: [id number] Assessed patient wound to R lower extremity. Wound appears to be ST healing appropriately. Wound continues with serosanguinous fluid drainage, but no S/Sx of infection noted. Edema continues and [facility] nursing has begun 2 ply  Compression dressing.  Tegaderm replaced and 2 ply compression dressing with Coban applied.

## 2013-07-23 NOTE — Telephone Encounter (Signed)
Refill appropriate and filled per protocol. 

## 2013-07-23 NOTE — Telephone Encounter (Signed)
Called pts Son Zelma Snead to see if he can bring [name] back today instead of tomorrow to have TB skin test check and assess leg d/t to inclement weather.

## 2013-07-24 ENCOUNTER — Ambulatory Visit: Payer: Medicare Other

## 2013-07-24 ENCOUNTER — Encounter: Payer: Medicare Other | Admitting: Adult Health

## 2013-07-25 ENCOUNTER — Telehealth: Payer: Self-pay | Admitting: *Deleted

## 2013-07-25 ENCOUNTER — Encounter: Payer: Self-pay | Admitting: *Deleted

## 2013-07-25 NOTE — Telephone Encounter (Signed)
noted 

## 2013-07-25 NOTE — Telephone Encounter (Signed)
This encounter was created in error - please disregard.

## 2013-07-25 NOTE — Progress Notes (Unsigned)
Subjective:     Patient ID: Abigail [name], female   DOB: Nov 21, 1919, 80 y.o.   MRN: [id number]  HPI   Review of Systems     Objective:   Physical Exam     Assessment:     ***    Plan:     ***

## 2013-07-25 NOTE — Telephone Encounter (Signed)
Call placed to [place] to ensure that patient would be seen and wound to RLE addressed by [facility] nurse. Colletta Maryland, care manager advised that patient is to bee seen on 07/25/2013 and [facility] nurse will assess wound and re-apply compression dressing.

## 2013-07-29 ENCOUNTER — Encounter: Payer: Self-pay | Admitting: Adult Health

## 2013-07-29 ENCOUNTER — Ambulatory Visit (INDEPENDENT_AMBULATORY_CARE_PROVIDER_SITE_OTHER): Payer: Medicare Other | Admitting: Adult Health

## 2013-07-29 VITALS — BP 106/56 | HR 90 | Ht 64.0 in | Wt 123.0 lb

## 2013-07-29 DIAGNOSIS — I4891 Unspecified atrial fibrillation: Secondary | ICD-10-CM

## 2013-07-29 DIAGNOSIS — J449 Chronic obstructive pulmonary disease, unspecified: Secondary | ICD-10-CM | POA: Diagnosis not present

## 2013-07-29 DIAGNOSIS — I509 Heart failure, unspecified: Secondary | ICD-10-CM | POA: Diagnosis not present

## 2013-07-29 DIAGNOSIS — I10 Essential (primary) hypertension: Secondary | ICD-10-CM | POA: Diagnosis not present

## 2013-07-29 DIAGNOSIS — I2510 Atherosclerotic heart disease of native coronary artery without angina pectoris: Secondary | ICD-10-CM

## 2013-07-29 DIAGNOSIS — I50810 Right heart failure, unspecified: Secondary | ICD-10-CM

## 2013-07-29 NOTE — Assessment & Plan Note (Signed)
I have spent 30 minutes with the patient and her son discussing anticoagulation. Her CHADS score. And need to consider anticoagulation therapy. With CHADS VASC  of 3, she has a 3.2% and adjusted ischemic rate per year. She is willing to take the risk and she does not wish to be placed on anticoagulation at this time. She will followup with Dr. Harrington Challenger in [place] at son's request as this is more convenient for him, since she is being moved to [facility]. We will continue current medication regimen as directed.

## 2013-07-29 NOTE — Patient Instructions (Signed)
Your physician wants you to follow-up in: 6 months You will receive a reminder letter in the mail two months in advance. If you don't receive a letter, please call our office to schedule the follow-up appointment.     Your physician recommends that you continue on your current medications as directed. Please refer to the Current Medication list given to you today.      Thank you for choosing [facility] !        

## 2013-07-29 NOTE — Assessment & Plan Note (Signed)
Her son states that she does not wear her oxygen continuously as has been directed. He feels that this is also contributed to her shortness of breath. He states that she wears it most of the time but takes it off when she goes to the bathroom or base. She can go as long as he 5 minutes without oxygen. It takes her a while to recover when she places it back on. She is been advised to continuously wear this oxygen. This will be continued when she is placed in a skilled nursing facility.

## 2013-07-29 NOTE — Assessment & Plan Note (Signed)
No evidence of fluid overload. No changes in her medication. She is advised a low-sodium diet. Her son who is with Thursdays that she is 80 years old and should be able to be which she chooses. She will not be adding salt to her diet but may need some salty foods. She has been advised of the risks of recurrent CHF. They verbalized understanding.

## 2013-07-29 NOTE — Assessment & Plan Note (Signed)
Currently well-controlled. Her pressure is low normal but she is become very sedentary. I will continue her on her current medications with close followup with Dr. Harrington Challenger appear

## 2013-07-29 NOTE — Progress Notes (Deleted)
Name: Abigail [name]    DOB: [birth year]  Age: 80 y.o.  MR#: [id number]       PCP:  Vic Blackbird, MD      Insurance: Payor: MEDICARE / Plan: MEDICARE PART A AND B / Product Type: *No Product type* /   CC:    Chief Complaint  Patient presents with  . Coronary Artery Disease    PTCA to RCA  . Atrial Fibrillation  . Hypertension    VS Filed Vitals:   07/29/13 1427  BP: 106/56  Pulse: 90  Height: 5\' 4"  (1.626 m)  Weight: 123 lb (55.792 kg)    Weights Current Weight  07/29/13 123 lb (55.792 kg)  07/23/13 121 lb (54.885 kg)  07/16/13 118 lb (53.524 kg)    Blood Pressure  BP Readings from Last 3 Encounters:  07/29/13 106/56  07/23/13 128/84  07/16/13 100/60     Admit date:  (Not on file) Last encounter with [facility]:  04/16/2013   Allergy Budesonide-formoterol fumarate; Sulfa antibiotics; Sulfonamide derivatives; Diltiazem; and Metoprolol  Current Outpatient Prescriptions  Medication Sig Dispense Refill  . aspirin 81 MG tablet Take 81 mg by mouth every morning.       Marland Kitchen atenolol (TENORMIN) 25 MG tablet Take 25 mg by mouth daily.      Marland Kitchen atenolol (TENORMIN) 50 MG tablet Take 50 mg by mouth daily.       . Calcium 1500 MG tablet Take 1,500 mg by mouth every morning.       . cholecalciferol (VITAMIN D) 1000 UNITS tablet Take 1,000 Units by mouth daily.        Marland Kitchen CRANBERRY PO Take 1 tablet by mouth 2 (two) times daily.      . Cyanocobalamin (VITAMIN B 12 PO) Take 500 mcg by mouth daily.      . Fluticasone-Salmeterol (ADVAIR DISKUS) 250-50 MCG/DOSE AEPB Inhale 1 puff into the lungs 2 (two) times daily.  60 each  3  . furosemide (LASIX) 20 MG tablet Take 2 tablets (40 mg total) by mouth daily.  60 tablet  0  . GRAPE SEED PO Take 1 capsule by mouth 2 (two) times daily.       . IRON PO Take 0.5 tablets by mouth daily.      Marland Kitchen levalbuterol (XOPENEX HFA) 45 MCG/ACT inhaler Inhale 2 puffs into the lungs every 6 (six) hours as needed for wheezing.  1 Inhaler  3  . Multiple Vitamins-Minerals  (CENTRUM SILVER PO) Take 1 tablet by mouth daily.       . nitroGLYCERIN (NITROSTAT) 0.4 MG SL tablet Place one under tongue every 5 minutes x 3 as needed  25 tablet  1  . Omega-3 Fatty Acids (FISH OIL) 1000 MG CAPS Take 1 capsule by mouth 2 (two) times daily.       Marland Kitchen omeprazole (PRILOSEC) 20 MG capsule Take 20 mg by mouth every morning.       . potassium chloride (K-DUR) 10 MEQ tablet Take 1 tablet (10 mEq total) by mouth once.  30 tablet  3   No current facility-administered medications for this visit.    Discontinued Meds:    Medications Discontinued During This Encounter  Medication Reason  . atenolol (TENORMIN) 25 MG tablet Error    Patient Active Problem List   Diagnosis Date Noted  . Gait instability 07/16/2013  . Traumatic hematoma of right lower leg 07/10/2013  . Thrombocytopenia 07/06/2013  . COPD (chronic obstructive pulmonary disease) 07/06/2013  . Pleural  effusion 06/09/2013  . Back pain with radiation 05/14/2013  . DDD (degenerative disc disease), lumbar 05/14/2013  . Back pain 04/15/2013  . SI (sacroiliac) pain 04/15/2013  . CHF with right heart failure 09/11/2012  . Peripheral edema 07/22/2012  . History of skin cancer 09/29/2011  . Goiter 07/20/2011  . Osteoporosis with fracture 07/20/2011  . CAROTID STENOSIS 01/24/2010  . HYPERLIPIDEMIA-MIXED 05/27/2008  . HYPERTENSION, BENIGN 05/27/2008  . CAD, NATIVE VESSEL 05/27/2008  . Atrial fibrillation 05/27/2008  . AORTIC ATHEROSCLEROSIS 05/27/2008  . PERIPHERAL VASCULAR DISEASE 12/18/2006  . REFLUX GASTRITIS 12/18/2006  . COLONIC POLYPS, HX OF 12/18/2006  . COPD 12/12/2006    LABS    Component Value Date/Time   NA 140 07/16/2013 1634   NA 143 07/09/2013 0538   NA 140 07/08/2013 0606   K 4.7 07/16/2013 1634   K 3.8 07/09/2013 0538   K 3.8 07/08/2013 0606   CL 96 07/16/2013 1634   CL 101 07/09/2013 0538   CL 97 07/08/2013 0606   CO2 37* 07/16/2013 1634   CO2 35* 07/09/2013 0538   CO2 34* 07/08/2013 0606   GLUCOSE  170* 07/16/2013 1634   GLUCOSE 114* 07/09/2013 0538   GLUCOSE 119* 07/08/2013 0606   BUN 22 07/16/2013 1634   BUN 20 07/09/2013 0538   BUN 22 07/08/2013 0606   CREATININE 1.17* 07/16/2013 1634   CREATININE 1.03 07/09/2013 0538   CREATININE 1.22* 07/08/2013 0606   CREATININE 1.32* 07/07/2013 0520   CREATININE 1.09 04/14/2013 1033   CREATININE 1.07 09/23/2012 1400   CALCIUM 9.2 07/16/2013 1634   CALCIUM 8.9 07/09/2013 0538   CALCIUM 9.1 07/08/2013 0606   GFRNONAA 46* 07/09/2013 0538   GFRNONAA 38* 07/08/2013 0606   GFRNONAA 34* 07/07/2013 0520   GFRAA 53* 07/09/2013 0538   GFRAA 44* 07/08/2013 0606   GFRAA 40* 07/07/2013 0520   CMP     Component Value Date/Time   NA 140 07/16/2013 1634   K 4.7 07/16/2013 1634   CL 96 07/16/2013 1634   CO2 37* 07/16/2013 1634   GLUCOSE 170* 07/16/2013 1634   BUN 22 07/16/2013 1634   CREATININE 1.17* 07/16/2013 1634   CREATININE 1.03 07/09/2013 0538   CALCIUM 9.2 07/16/2013 1634   PROT 6.5 05/24/2013 0154   ALBUMIN 3.0* 05/24/2013 0154   AST 17 05/24/2013 0154   ALT 11 05/24/2013 0154   ALKPHOS 70 05/24/2013 0154   BILITOT 1.2 05/24/2013 0154   GFRNONAA 46* 07/09/2013 0538   GFRAA 53* 07/09/2013 0538       Component Value Date/Time   WBC 6.8 07/16/2013 1634   WBC 7.4 07/06/2013 1350   WBC 7.9 05/24/2013 0154   HGB 14.0 07/16/2013 1634   HGB 14.5 07/06/2013 1350   HGB 14.5 05/24/2013 0154   HCT 43.4 07/16/2013 1634   HCT 43.5 07/06/2013 1350   HCT 41.6 05/24/2013 0154   MCV 104.6* 07/16/2013 1634   MCV 105.8* 07/06/2013 1350   MCV 103.5* 05/24/2013 0154    Lipid Panel     Component Value Date/Time   CHOL 163 07/31/2012 0907   TRIG 58 07/31/2012 0907   HDL 62 07/31/2012 0907   CHOLHDL 2.6 07/31/2012 0907   VLDL 12 07/31/2012 0907   LDLCALC 89 07/31/2012 0907    ABG    Component Value Date/Time   TCO2 27 02/24/2009 0829     Lab Results  Component Value Date   TSH 0.956 07/06/2013   BNP (last 3 results)  Recent Labs  09/10/12 2234 07/06/13 1350  PROBNP 1840.0*  3928.0*   Cardiac Panel (last 3 results) No results found for this basename: CKTOTAL, CKMB, TROPONINI, RELINDX,  in the last 72 hours  Iron/TIBC/Ferritin    Component Value Date/Time   IRON 84 08/29/2006 1201     EKG Orders placed during the hospital encounter of 07/06/13  . ED EKG  . EKG 12-LEAD  . EKG 12-LEAD  . EKG     Prior Assessment and Plan Problem List as of 07/29/2013     Cardiovascular and Mediastinum   HYPERTENSION, BENIGN   Last Assessment & Plan   12/10/2012 Office Visit Written 12/10/2012 10:03 PM by Alycia Rossetti, MD     Well controlled, no symptoms at current readings    CAD, NATIVE VESSEL   Last Assessment & Plan   09/23/2012 Office Visit Written 09/23/2012  1:22 PM by Lendon Colonel, NP     No cardiac complaints.No changes    Atrial fibrillation   Last Assessment & Plan   07/16/2013 Office Visit Written 07/16/2013  4:40 PM by Alycia Rossetti, MD     Continue atenolol 75mg , did not tolerate metoprolol ASA due to high risk falls      CAROTID STENOSIS   Last Assessment & Plan   07/01/2012 Office Visit Written 07/01/2012 12:00 PM by Alycia Rossetti, MD     She is due for repeat carotid Doppler she last had this in 2012 per the records. She's not on statin drugs secondary to intolerance she does take fish oil and grapeseed her LDL and 2013 was 107    AORTIC ATHEROSCLEROSIS   PERIPHERAL VASCULAR DISEASE   Last Assessment & Plan   09/10/2012 Office Visit Written 09/10/2012 11:54 AM by Alycia Rossetti, MD     Noted on exam diminished pulses, change in skin color with the swelling, I see no benefit in intervening, discussed this with her and family Able to ambulate, no pain, doing well otherwise     CHF with right heart failure   Last Assessment & Plan   04/14/2013 Office Visit Written 04/15/2013 10:27 PM by Alycia Rossetti, MD     She's currently compensated. Continue the Lasix and her atenolol      Respiratory   COPD   Last Assessment & Plan    07/16/2013 Office Visit Written 07/16/2013  4:39 PM by Alycia Rossetti, MD     At baseline continue oxygen therapy    Pleural effusion   Last Assessment & Plan   06/09/2013 Office Visit Written 06/09/2013  9:21 PM by Alycia Rossetti, MD     bilat pleural effusions very small, her weight is stable She has not been taking lasix past few weeks Will add back once a day with potassium     COPD (chronic obstructive pulmonary disease)     Digestive   REFLUX GASTRITIS     Endocrine   Goiter   Last Assessment & Plan   07/17/2011 Office Visit Written 07/20/2011  1:40 AM by Tonia Ghent, MD     H/o, TSH wnl.       Musculoskeletal and Integument   Osteoporosis with fracture   Last Assessment & Plan   07/17/2011 Office Visit Written 07/20/2011  1:42 AM by Tonia Ghent, MD     Presumed osteoporosis with prev fx.  I would not start bisphosphonate tx due to her h/o GI sx.      DDD (degenerative disc disease), lumbar  Last Assessment & Plan   07/16/2013 Office Visit Written 07/16/2013  4:40 PM by Alycia Rossetti, MD     Lift chair given I think she benefit from ALF/SNF paperwork to be completed      Other   HYPERLIPIDEMIA-MIXED   Last Assessment & Plan   09/28/2011 Office Visit Written 09/29/2011 12:01 PM by Alycia Rossetti, MD     Controlled with diet, does not tolerate statins    COLONIC POLYPS, HX OF   History of skin cancer   Last Assessment & Plan   11/28/2011 Office Visit Written 11/29/2011 10:31 PM by Alycia Rossetti, MD     I will obain note from dermatologist    Peripheral edema   Last Assessment & Plan   07/16/2013 Office Visit Written 07/16/2013  4:38 PM by Alycia Rossetti, MD     Increase lasix, total 60mg  daily with K for next week Recheck by [facility]    Back pain   Last Assessment & Plan   04/14/2013 Office Visit Written 04/15/2013 10:26 PM by Alycia Rossetti, MD     Likely degenerative disc disease. she can continue the acetaminophen. I will obtain an x-[place] to make sure  she has a compression fracture    SI (sacroiliac) pain   Last Assessment & Plan   04/14/2013 Office Visit Written 04/15/2013 10:26 PM by Alycia Rossetti, MD     She has pain over her SI joint. She has had bilateral hip repair with the beginnings therefore will obtain an x-[place] of her hips    Back pain with radiation   Last Assessment & Plan   05/14/2013 Office Visit Edited 05/14/2013  9:39 PM by Alycia Rossetti, MD     I concern about the worsening of her back pain. I will like to rule out spinal stenosis versus disc bulge with nerve root irritation. I will provide her with tramadol today and she will also take Tylenol twice a day. I do not see a compression fracture the x-rays however it does could be missed on plain films. I did discuss this with the patient and her son and they want to proceed with imaging and treatment.  UA also done to r/o infection    Thrombocytopenia   Last Assessment & Plan   07/16/2013 Office Visit Written 07/16/2013  4:38 PM by Alycia Rossetti, MD     Recheck PLT    Traumatic hematoma of right lower leg   Last Assessment & Plan   07/16/2013 Office Visit Written 07/16/2013  4:37 PM by Alycia Rossetti, MD     Hematoma slowly resolving, no break in skin Expect a few weeks until resolved    Gait instability       Imaging: Dg Chest 2 View  07/06/2013   CLINICAL DATA:  Shortness of Breath  EXAM: CHEST  2 VIEW  COMPARISON:  June 09, 2013  FINDINGS: There is underlying emphysematous change. There is stable interstitial prominence, prior chronic inflammatory type change. A degree of chronic congestive heart failure, however, cannot be excluded. There is no airspace consolidation.  Heart is mildly enlarged. The pulmonary vascularity is overall within normal limits, possibly slightly prominent given underlying emphysema. The appearance is stable.  There is extensive atherosclerotic change in the aorta. No adenopathy. Patient is status post kyphoplasty at T4. Marked  collapse of the T6 vertebral body is stable. There is increase in kyphosis in the mid thoracic region.  IMPRESSION: Stable chest. Underlying emphysema.  Question chronic inflammatory change versus a degree of chronic congestive heart failure superimposed on emphysema. No airspace consolidation. Extensive atherosclerotic change. Collapse of the T6 vertebral body with focal kyphosis in the midthoracic region is stable.   Electronically Signed   By: Lowella Grip M.D.   On: 07/06/2013 12:43

## 2013-07-29 NOTE — Progress Notes (Signed)
HPI: Abigail [name] is a 80 year old patient of Dr. Harl Bowie we are following for ongoing assessment and management of CAD, prior PTCA to the right coronary artery, atrial fibrillation, no anticoagulation, , hypertension, and hyperlipidemia. Intolerant to statins due to myalgias.   She was last seen in the office by Dr. Harl Bowie on 06/16/2013. She is here for followup to discuss the need for anticoagulation. She has a CHADS  score of 2. She has documented rectal bleeding and history of duodenal AVMs. He also wished to discuss the need to begin pravastatin to evaluate if she was able to tolerate this.    She comes today stating that she does not wish to be placed on Eliquis or any kind of anticoagulation at this time. He states he is willing to take the risk of CVA. Review of records does not demonstrate that Dr. Harl Bowie has commented on his recommendation. She otherwise has not had any complaints.    Secondly, her son who is with her, states he has moving her to [facility]. She will be placed in a skilled nursing facility 3 miles from his home. He wishes to followup with Dr. Harrington Challenger in [place] as this will be easier for him to take her to appointments.          Allergies  Allergen Reactions  . Budesonide-Formoterol Fumarate Other (See Comments)    REACTION: STATES FEELS AS IF HAVING HEART ATTACK  . Sulfa Antibiotics Other (See Comments)    "just doesn't agree with me"  . Sulfonamide Derivatives Other (See Comments)    REACTION: unknown  . Diltiazem Other (See Comments)    Low pulse  . Metoprolol Other (See Comments)    Low pulse    Current Outpatient Prescriptions  Medication Sig Dispense Refill  . aspirin 81 MG tablet Take 81 mg by mouth every morning.       Marland Kitchen atenolol (TENORMIN) 25 MG tablet Take 25 mg by mouth daily.      Marland Kitchen atenolol (TENORMIN) 50 MG tablet Take 50 mg by mouth daily.       . Calcium 1500 MG tablet Take 1,500 mg by mouth every morning.       . cholecalciferol  (VITAMIN D) 1000 UNITS tablet Take 1,000 Units by mouth daily.        Marland Kitchen CRANBERRY PO Take 1 tablet by mouth 2 (two) times daily.      . Cyanocobalamin (VITAMIN B 12 PO) Take 500 mcg by mouth daily.      . Fluticasone-Salmeterol (ADVAIR DISKUS) 250-50 MCG/DOSE AEPB Inhale 1 puff into the lungs 2 (two) times daily.  60 each  3  . furosemide (LASIX) 20 MG tablet Take 2 tablets (40 mg total) by mouth daily.  60 tablet  0  . GRAPE SEED PO Take 1 capsule by mouth 2 (two) times daily.       . IRON PO Take 0.5 tablets by mouth daily.      Marland Kitchen levalbuterol (XOPENEX HFA) 45 MCG/ACT inhaler Inhale 2 puffs into the lungs every 6 (six) hours as needed for wheezing.  1 Inhaler  3  . Multiple Vitamins-Minerals (CENTRUM SILVER PO) Take 1 tablet by mouth daily.       . nitroGLYCERIN (NITROSTAT) 0.4 MG SL tablet Place one under tongue every 5 minutes x 3 as needed  25 tablet  1  . Omega-3 Fatty Acids (FISH OIL) 1000 MG CAPS Take 1 capsule by mouth 2 (two) times daily.       Marland Kitchen  omeprazole (PRILOSEC) 20 MG capsule Take 20 mg by mouth every morning.       . potassium chloride (K-DUR) 10 MEQ tablet Take 1 tablet (10 mEq total) by mouth once.  30 tablet  3   No current facility-administered medications for this visit.    Past Medical History  Diagnosis Date  . Asthma   . Coronary atherosclerosis of native coronary artery     s/p PTCA RCA (Dr. Clayborn Bigness, [place])  . PVD (peripheral vascular disease)   . Atrial fibrillation   . Essential hypertension, benign   . Amenia   . COPD (chronic obstructive pulmonary disease) 2008    Emphysemaous with asthmatic component-FEV 2008 34%  . Rectal bleeding   . SBO (small bowel obstruction)   . MRSA (methicillin resistant Staphylococcus aureus)   . Nontoxic multinodular goiter   . History of esophagitis     Gastritis, duodenitis. EGD neg 10/20/02  . Hypercholesterolemia   . Arteriovenous malformation of duodenum   . Leg cramps     With mild claudication  .  Diverticulosis of colon   . Hx of colonic polyps   . Gall stones 10/1997  . Goiter     thyroid US- bx 06/26/03  . Femoral neck fracture     ORIF, Dr. Latanya Maudlin 02/20/08  . Compression fracture of vertebra     T4/ kyphoplasty, hosp 12/31-06/02/10  . Carcinoma of colon     s/p resection  . Skin cancer     L forearm 2012, per derm    Past Surgical History  Procedure Laterality Date  . Abdominal hysterectomy    . Hip fracture surgery    . Colon resection  05/1997    cancer and carcinoid tumor  . Ileostomy  05/1997    takedown 7/99  . Angioplasty  7/00    R CA  . Abdominal surgery      incarcerated ventral hernia, MRSA 06/28/00  . Cataract extraction  2004    bilateral  . Total abdominal hysterectomy    . Cardiac catheterization      stent/ ER, ok LE's 10/00. PV airtigraogy- stent 10/00    ROS: Review of systems complete and found to be negative unless listed above  PHYSICAL EXAM BP 106/56  Pulse 90  Ht 5\' 4"  (1.626 m)  Wt 123 lb (55.792 kg)  BMI 21.10 kg/m2  .General: Well developed, well nourished, in no acute distress, wearing oxygen. Head: Eyes PERRLA, No xanthomas.   Normal cephalic and atramatic  Lungs: Clear bilaterally to auscultation and percussion. Heart: HRIR S1 S2, without MRG.  Pulses are 2+ & equal.            No carotid bruit. No JVD.  No abdominal bruits. No femoral bruits. Abdomen: Bowel sounds are positive, abdomen soft and non-tender without masses or                  Hernia's noted. Msk:  Back normal, normal gait. Normal strength and tone for age. Extremities: No clubbing, cyanosis or edema.  DP +1 Neuro: Alert and oriented X 3. Very hard of hearing. Psych:  Good affect, responds appropriately:  ASSESSMENT AND PLAN

## 2013-08-01 ENCOUNTER — Other Ambulatory Visit: Payer: Self-pay | Admitting: *Deleted

## 2013-08-01 MED ORDER — FLUTICASONE-SALMETEROL 250-50 MCG/DOSE IN AEPB: 1.0000 | INHALATION_SPRAY | Freq: Two times a day (BID) | RESPIRATORY_TRACT | Status: AC

## 2013-08-01 NOTE — Telephone Encounter (Signed)
Refill appropriate and filled per protocol. 

## 2013-08-12 ENCOUNTER — Ambulatory Visit: Payer: Medicare Other | Admitting: Family Medicine

## 2013-08-14 IMAGING — CR DG ABDOMEN ACUTE W/ 1V CHEST
3 series · 3 of 3 positions shown · non-contrast
Comparison: CT 04/20/2008, plain films 05/19/1999

CLINICAL DATA: Rule out small bowel obstruction

ACUTE ABDOMEN SERIES (ABDOMEN 2 VIEW & CHEST 1 VIEW)

[t abdomen supine]
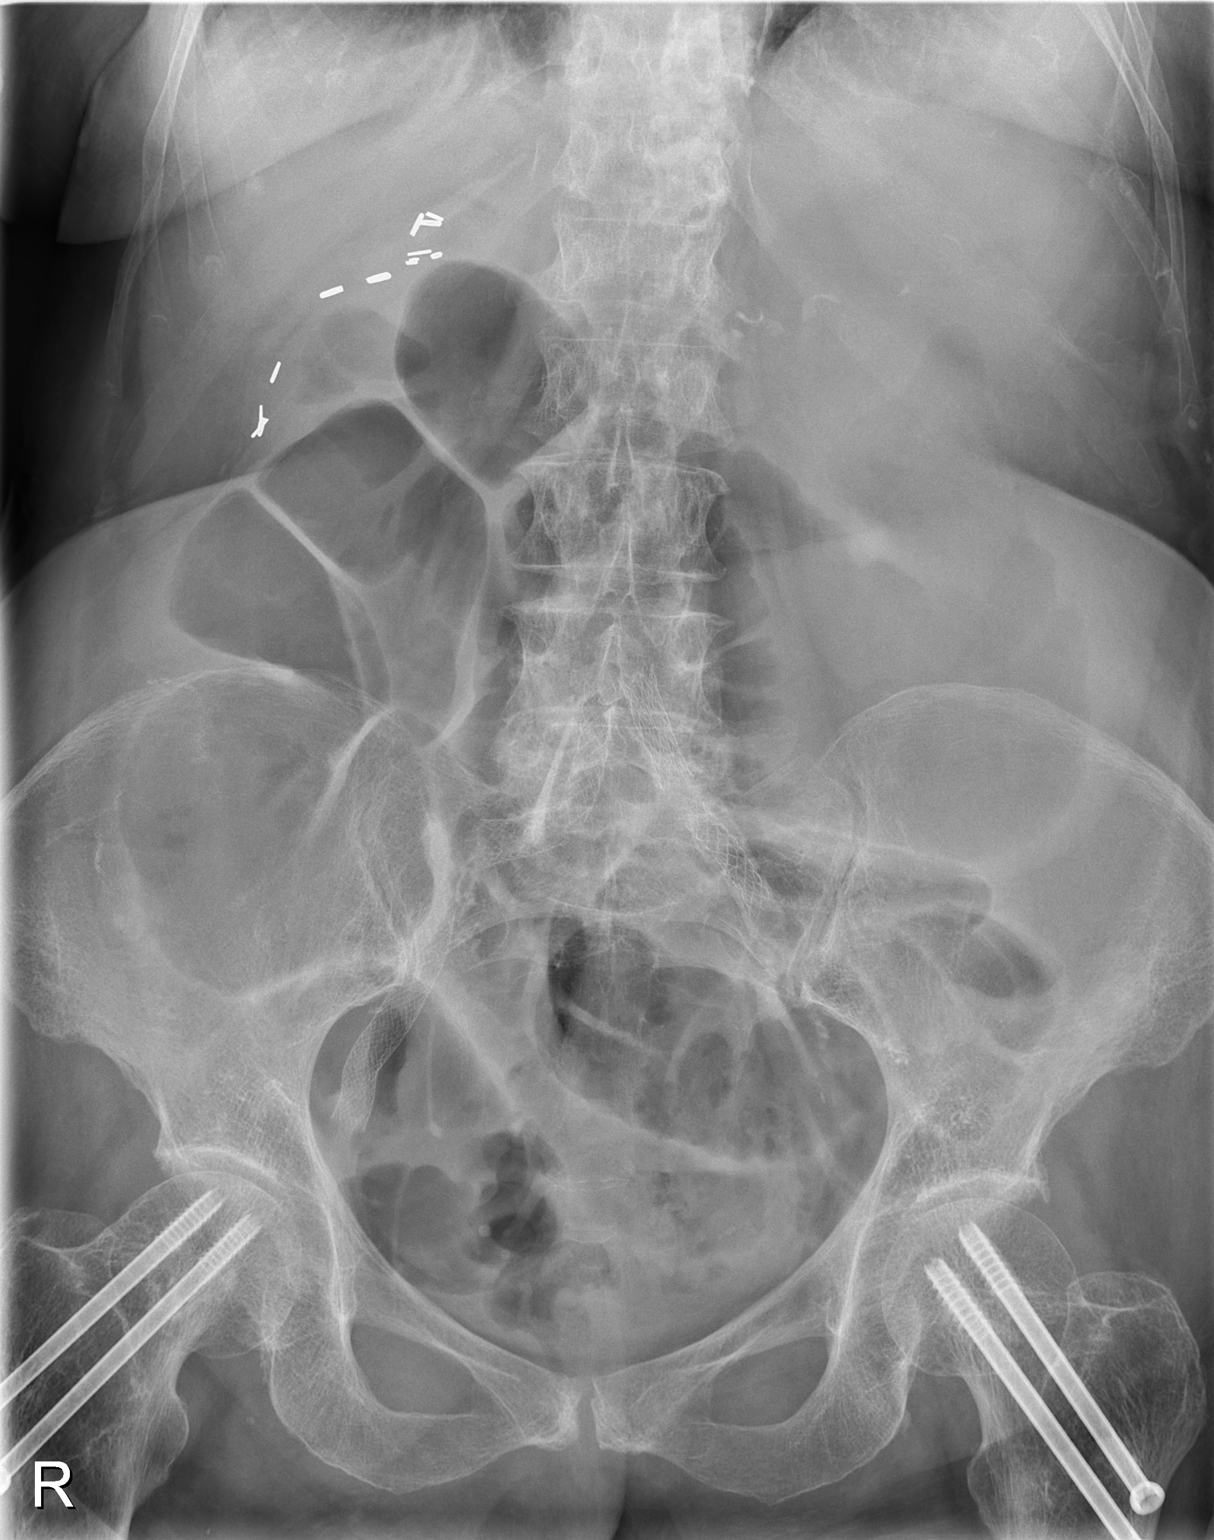

[t chest supine]
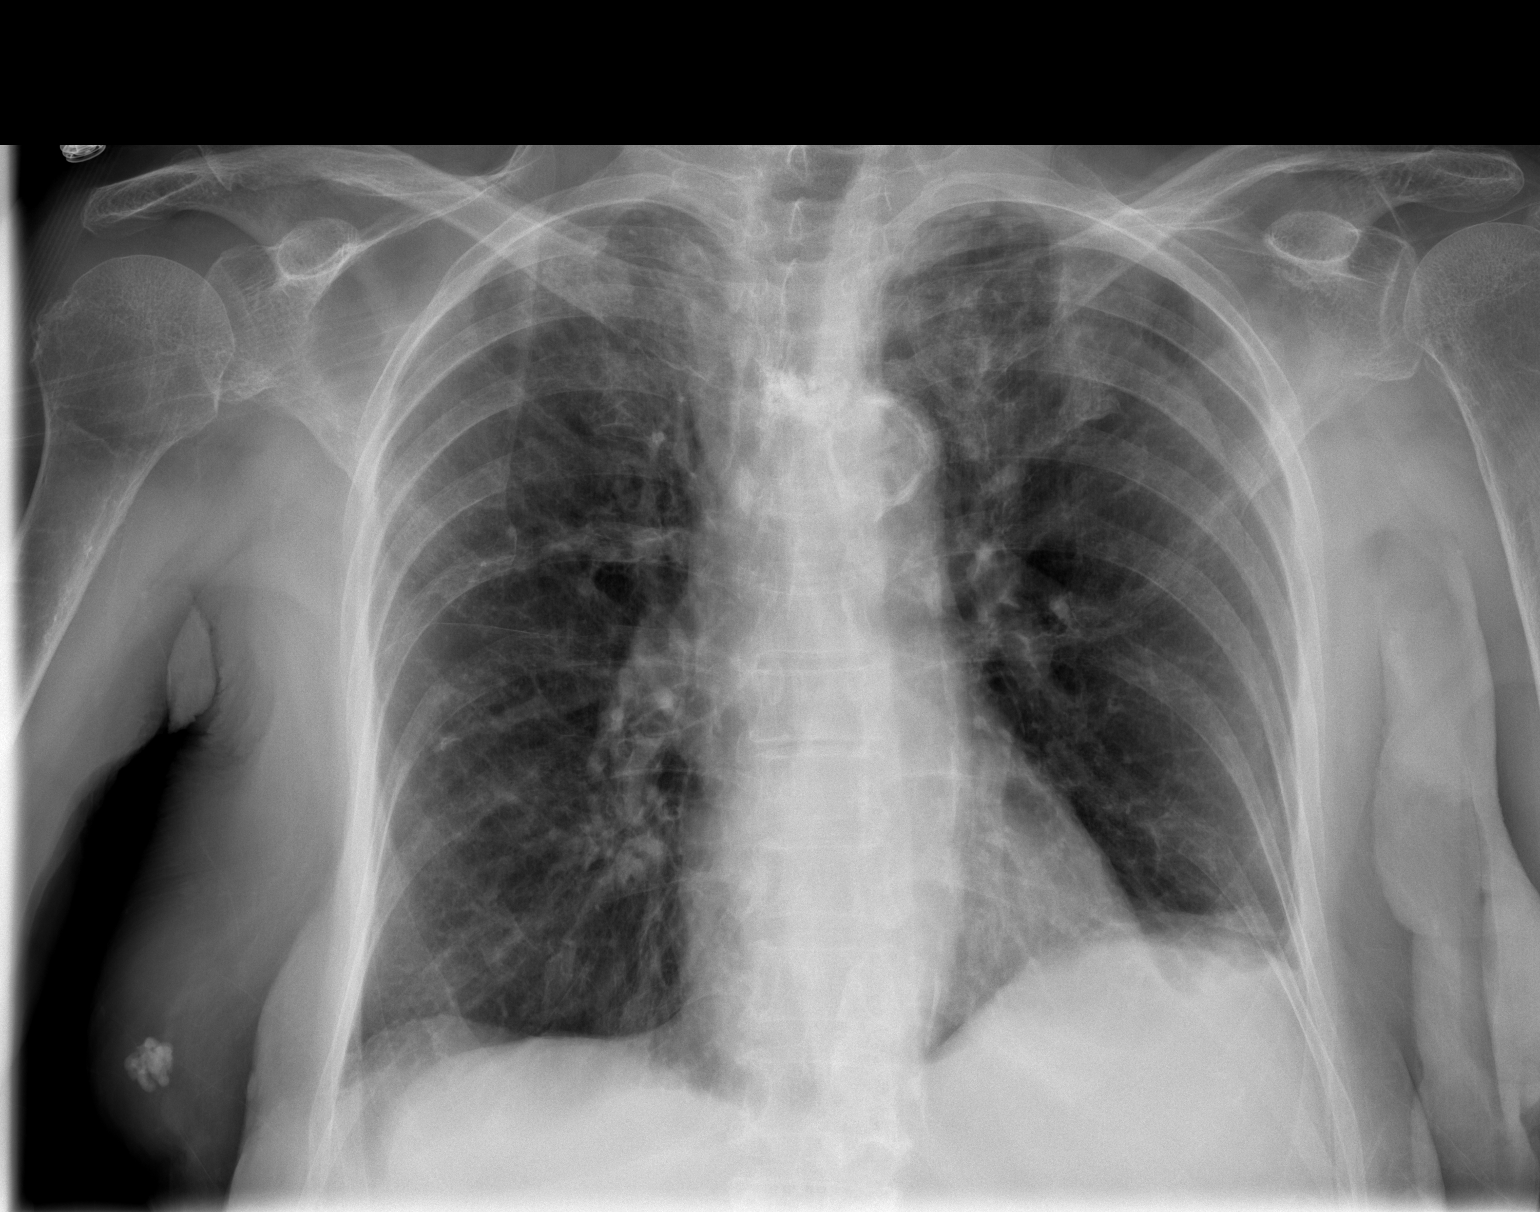

[w abdomen decub]
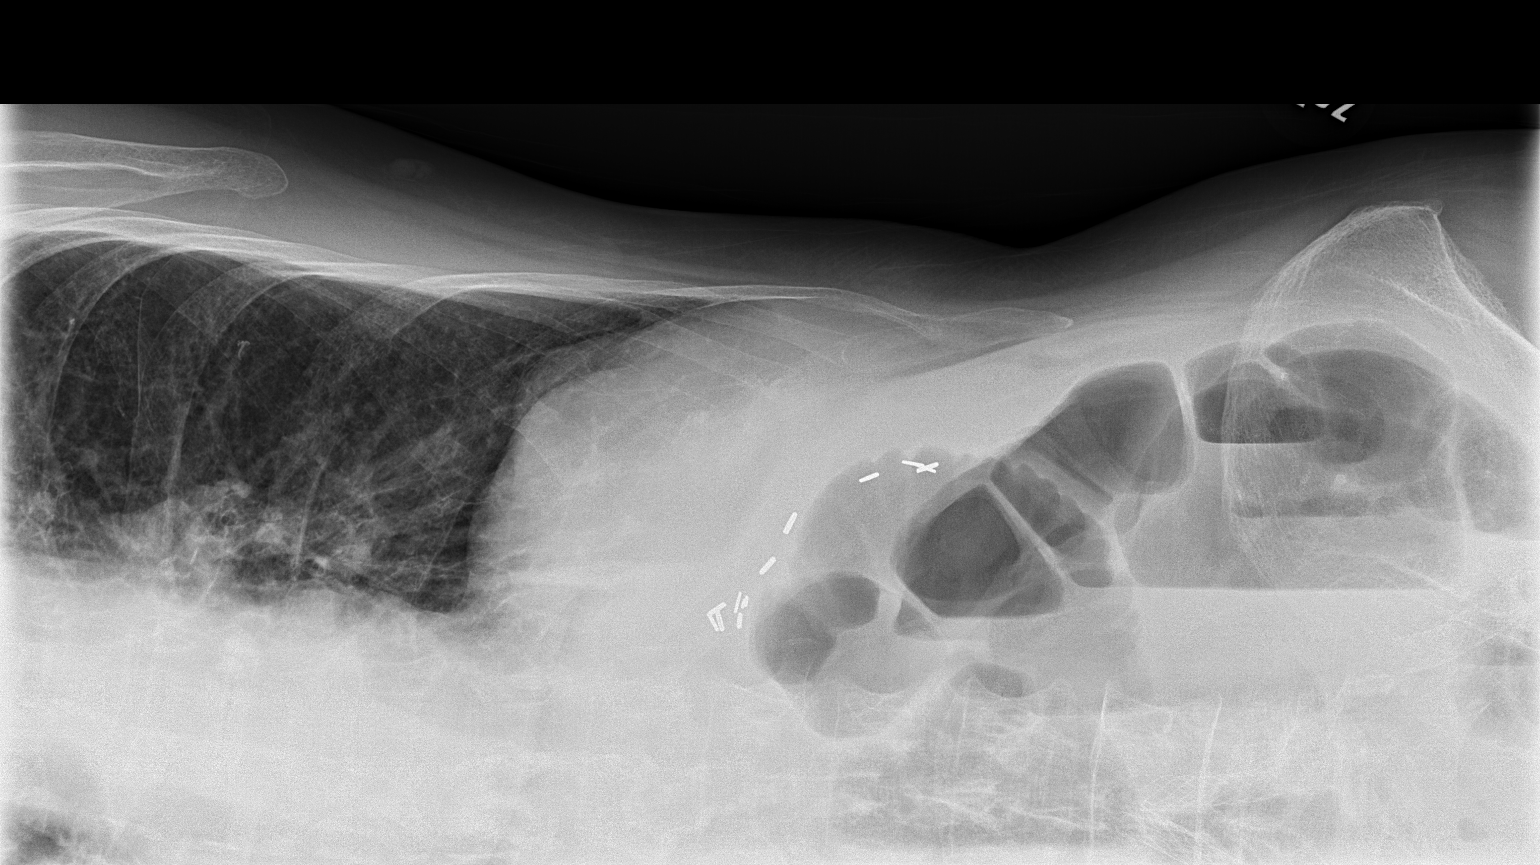

[3 of 3 positions shown; findings below may reference images not displayed]

FINDINGS: Stable cardiac silhouette.  There is elevation left
hemidiaphragm with associate atelectasis.  There is upper lobe of
the pulmonary scarring extending to the apices.

 There is single loop of dilated small bowel on the lateral
projection.  Air fluid levels within the colon.  No intraperitoneal
free air. Gas in the rectum.  Vascular stents noted.
IMPRESSION: 1.   No evidence of high-grade obstruction.

2.  Single loop of dilated small bowel and fluid within the colon
could represent ileus.  Cannot exclude early obstruction.

3.  Pulmonary scarring in the upper lobes.

## 2013-10-24 NOTE — Progress Notes (Signed)
This encounter was created in error - please disregard.

## 2014-01-25 NOTE — Progress Notes (Signed)
HPI: Abigail [name] is a 80 yo with history of CAD, prior PTCA to the right coronary artery, atrial fibrillation, no anticoagulation, , hypertension, and hyperlipidemia. Intolerant to statins due to myalgias.    She was seen in March by Arnold Long  Not on anticoagulation Denies CP  Breathing stable  No palpitations   Allergies  Allergen Reactions  . Budesonide-Formoterol Fumarate Other (See Comments) and Nausea And Vomiting    REACTION: STATES FEELS AS IF HAVING HEART ATTACK  . Sulfa Antibiotics Other (See Comments)    "just doesn't agree with me"  . Sulfonamide Derivatives Other (See Comments)    REACTION: unknown  . Sulfur Nausea And Vomiting  . Diltiazem Other (See Comments) and Nausea And Vomiting    Low pulse  . Metoprolol Other (See Comments)    Low pulse    Current Outpatient Prescriptions  Medication Sig Dispense Refill  . aspirin 81 MG tablet Take 81 mg by mouth every morning.       Marland Kitchen atenolol (TENORMIN) 25 MG tablet Take 25 mg by mouth daily.      Marland Kitchen atenolol (TENORMIN) 50 MG tablet Take 50 mg by mouth daily.       . Calcium 1500 MG tablet Take 1,500 mg by mouth every morning.       . cholecalciferol (VITAMIN D) 1000 UNITS tablet Take 1,000 Units by mouth daily.        Marland Kitchen CRANBERRY PO Take 1 tablet by mouth 2 (two) times daily.      . Cyanocobalamin (VITAMIN B 12 PO) Take 500 mcg by mouth daily.      . Fluticasone-Salmeterol (ADVAIR DISKUS) 250-50 MCG/DOSE AEPB Inhale 1 puff into the lungs 2 (two) times daily.  60 each  3  . furosemide (LASIX) 20 MG tablet Take 2 tablets (40 mg total) by mouth daily.  60 tablet  0  . GRAPE SEED PO Take 1 capsule by mouth 2 (two) times daily.       . IRON PO Take 0.5 tablets by mouth daily.      . Multiple Vitamins-Minerals (CENTRUM SILVER PO) Take 1 tablet by mouth daily.       . nitroGLYCERIN (NITROSTAT) 0.4 MG SL tablet Place one under tongue every 5 minutes x 3 as needed  25 tablet  1  . Omega-3 Fatty Acids (FISH OIL) 1000 MG CAPS Take  1 capsule by mouth 2 (two) times daily.       Marland Kitchen omeprazole (PRILOSEC) 20 MG capsule Take 20 mg by mouth every morning.       . potassium chloride (K-DUR) 10 MEQ tablet Take 1 tablet (10 mEq total) by mouth once.  30 tablet  3   No current facility-administered medications for this visit.    Past Medical History  Diagnosis Date  . Asthma   . Coronary atherosclerosis of native coronary artery     s/p PTCA RCA (Dr. Clayborn Bigness, [place])  . PVD (peripheral vascular disease)   . Atrial fibrillation   . Essential hypertension, benign   . Amenia   . COPD (chronic obstructive pulmonary disease) 2008    Emphysemaous with asthmatic component-FEV 2008 34%  . Rectal bleeding   . SBO (small bowel obstruction)   . MRSA (methicillin resistant Staphylococcus aureus)   . Nontoxic multinodular goiter   . History of esophagitis     Gastritis, duodenitis. EGD neg 10/20/02  . Hypercholesterolemia   . Arteriovenous malformation of duodenum   . Leg cramps  With mild claudication  . Diverticulosis of colon   . Hx of colonic polyps   . Gall stones 10/1997  . Goiter     thyroid US- bx 06/26/03  . Femoral neck fracture     ORIF, Dr. Latanya Maudlin 02/20/08  . Compression fracture of vertebra     T4/ kyphoplasty, hosp 12/31-06/02/10  . Carcinoma of colon     s/p resection  . Skin cancer     L forearm 2012, per derm    Past Surgical History  Procedure Laterality Date  . Abdominal hysterectomy    . Hip fracture surgery    . Colon resection  05/1997    cancer and carcinoid tumor  . Ileostomy  05/1997    takedown 7/99  . Angioplasty  7/00    R CA  . Abdominal surgery      incarcerated ventral hernia, MRSA 06/28/00  . Cataract extraction  2004    bilateral  . Total abdominal hysterectomy    . Cardiac catheterization      stent/ ER, ok LE's 10/00. PV airtigraogy- stent 10/00    ROS: Review of systems complete and found to be negative unless listed above  PHYSICAL EXAM BP 102/68  Pulse 94  Ht 5'  4" (1.626 m)  Wt 121 lb (54.885 kg)  BMI 20.76 kg/m2  .General: Well developed, well nourished, in no acute distress, wearing oxygen. Head: Eyes PERRLA, No xanthomas.   Normal cephalic and atramatic  Lungs: Clear bilaterally to auscultation and percussion. Heart: HRIR S1 S2, without MRG.  Pulses are 2+ & equal.            No carotid bruit. No JVD.  No abdominal bruits. No femoral bruits. Abdomen: Bowel sounds are positive, abdomen soft and non-tender without masses or                  Hernia's noted. Msk:  Back normal, normal gait. Normal strength and tone for age. Extremities: No clubbing, cyanosis or edema.  DP +1 Neuro: Alert and oriented X 3. Very hard of hearing. Psych:  Good affect, responds appropriately:  ASSESSMENT AND PLAN  1.  Atrial fib  COntinue current regimen  Refuses anticoag  2.  HTN  BP good  Patinet denies sympotms.  NOt on much for BP    3. CHF  Mixed systolic / diastolic  Volume status OK  Keep on same regimen  Will review labs.

## 2014-01-26 ENCOUNTER — Ambulatory Visit (INDEPENDENT_AMBULATORY_CARE_PROVIDER_SITE_OTHER): Payer: Medicare Other | Admitting: Internal Medicine

## 2014-01-26 ENCOUNTER — Encounter: Payer: Self-pay | Admitting: Internal Medicine

## 2014-01-26 VITALS — BP 102/68 | HR 94 | Ht 64.0 in | Wt 121.0 lb

## 2014-01-26 DIAGNOSIS — I10 Essential (primary) hypertension: Secondary | ICD-10-CM

## 2014-01-26 DIAGNOSIS — I2510 Atherosclerotic heart disease of native coronary artery without angina pectoris: Secondary | ICD-10-CM

## 2014-01-26 NOTE — Patient Instructions (Signed)
Your physician recommends that you continue on your current medications as directed. Please refer to the Current Medication list given to you today. Your physician wants you to follow-up in: 6 months with Dr. Ross.  You will receive a reminder letter in the mail two months in advance. If you don't receive a letter, please call our office to schedule the follow-up appointment.  

## 2014-07-27 ENCOUNTER — Ambulatory Visit (INDEPENDENT_AMBULATORY_CARE_PROVIDER_SITE_OTHER): Payer: Medicare Other | Admitting: Internal Medicine

## 2014-07-27 ENCOUNTER — Encounter: Payer: Self-pay | Admitting: Internal Medicine

## 2014-07-27 VITALS — BP 98/70 | HR 87 | Ht 64.0 in | Wt 125.0 lb

## 2014-07-27 DIAGNOSIS — E785 Hyperlipidemia, unspecified: Secondary | ICD-10-CM | POA: Diagnosis not present

## 2014-07-27 DIAGNOSIS — I509 Heart failure, unspecified: Secondary | ICD-10-CM

## 2014-07-27 DIAGNOSIS — I50810 Right heart failure, unspecified: Secondary | ICD-10-CM

## 2014-07-27 DIAGNOSIS — I10 Essential (primary) hypertension: Secondary | ICD-10-CM | POA: Diagnosis not present

## 2014-07-27 DIAGNOSIS — R609 Edema, unspecified: Secondary | ICD-10-CM | POA: Diagnosis not present

## 2014-07-27 LAB — CBC
HCT: 43.1 % (ref 36.0–46.0)
Hemoglobin: 14.4 g/dL (ref 12.0–15.0)
MCHC: 33.5 g/dL (ref 30.0–36.0)
MCV: 102.1 fl — AB (ref 78.0–100.0)
Platelets: 100 10*3/uL — ABNORMAL LOW (ref 150.0–400.0)
RBC: 4.23 Mil/uL (ref 3.87–5.11)
RDW: 14.6 % (ref 11.5–15.5)
WBC: 8.5 10*3/uL (ref 4.0–10.5)

## 2014-07-27 LAB — LIPID PANEL
CHOL/HDL RATIO: 3
Cholesterol: 189 mg/dL (ref 0–200)
HDL: 65.5 mg/dL (ref >39.00)
LDL Cholesterol: 107 mg/dL — ABNORMAL HIGH (ref 0–99)
NONHDL: 123.5
Triglycerides: 82 mg/dL (ref 0.0–149.0)
VLDL: 16.4 mg/dL (ref 0.0–40.0)

## 2014-07-27 LAB — TSH: TSH: 0.57 u[IU]/mL (ref 0.35–4.50)

## 2014-07-27 LAB — BASIC METABOLIC PANEL
BUN: 33 mg/dL — ABNORMAL HIGH (ref 6–23)
CALCIUM: 10.1 mg/dL (ref 8.4–10.5)
CO2: 36 mEq/L — ABNORMAL HIGH (ref 19–32)
CREATININE: 1.68 mg/dL — AB (ref 0.40–1.20)
Chloride: 99 mEq/L (ref 96–112)
GFR: 30.26 mL/min — ABNORMAL LOW (ref >60.00)
Glucose, Bld: 89 mg/dL (ref 70–99)
Potassium: 4.5 mEq/L (ref 3.5–5.1)
Sodium: 141 mEq/L (ref 135–145)

## 2014-07-27 LAB — BRAIN NATRIURETIC PEPTIDE: PRO B NATRI PEPTIDE: 180 pg/mL — AB (ref 0.0–100.0)

## 2014-07-27 NOTE — Progress Notes (Signed)
Cardiology Office Note   Date:  07/27/2014   ID:  Abigail [name], DOB Aug 20, 1919, MRN [id number]  PCP:  Vic Blackbird, MD  Cardiologist:   Dorris Carnes, MD   Patinet returns for continued care of CAD, atrial fib    History of Present Illness: Abigail [name] is a 81 y.o. female with a history off CAD, prior PTCA to the right coronary artery, atrial fibrillation, no anticoagulation, , hypertension, and hyperlipidemia. Intolerant to statins due to myalgias. SInce I saw her last her breathing has been stable  She still can get SOB with activty.  On )2.   Denies CP  No dizziness  Current Outpatient Prescriptions  Medication Sig Dispense Refill  . aspirin 81 MG tablet Take 81 mg by mouth every morning.     Marland Kitchen atenolol (TENORMIN) 25 MG tablet Take 25 mg by mouth daily.    Marland Kitchen atenolol (TENORMIN) 50 MG tablet Take 50 mg by mouth daily.     . Calcium 1500 MG tablet Take 1,500 mg by mouth every morning.     . cholecalciferol (VITAMIN D) 1000 UNITS tablet Take 1,000 Units by mouth daily.      Marland Kitchen CRANBERRY PO Take 1 tablet by mouth 2 (two) times daily.    . Cyanocobalamin (VITAMIN B 12 PO) Take 500 mcg by mouth daily.    . Fluticasone-Salmeterol (ADVAIR DISKUS) 250-50 MCG/DOSE AEPB Inhale 1 puff into the lungs 2 (two) times daily. 60 each 3  . GRAPE SEED PO Take 1 capsule by mouth 2 (two) times daily.     . IRON PO Take 0.5 tablets by mouth daily.    . Multiple Vitamins-Minerals (CENTRUM SILVER PO) Take 1 tablet by mouth daily.     . nitroGLYCERIN (NITROSTAT) 0.4 MG SL tablet Place one under tongue every 5 minutes x 3 as needed 25 tablet 1  . Omega-3 Fatty Acids (FISH OIL) 1000 MG CAPS Take 1 capsule by mouth 2 (two) times daily.     Marland Kitchen omeprazole (PRILOSEC) 20 MG capsule Take 20 mg by mouth every morning.     . potassium chloride (K-DUR) 10 MEQ tablet Take 1 tablet (10 mEq total) by mouth once. 30 tablet 3   No current facility-administered medications for this visit.    Allergies:    Budesonide-formoterol fumarate; Sulfa antibiotics; Sulfonamide derivatives; Sulfur; Diltiazem; and Metoprolol   Past Medical History  Diagnosis Date  . Asthma   . Coronary atherosclerosis of native coronary artery     s/p PTCA RCA (Dr. Clayborn Bigness, [place])  . PVD (peripheral vascular disease)   . Atrial fibrillation   . Essential hypertension, benign   . Amenia   . COPD (chronic obstructive pulmonary disease) 2008    Emphysemaous with asthmatic component-FEV 2008 34%  . Rectal bleeding   . SBO (small bowel obstruction)   . MRSA (methicillin resistant Staphylococcus aureus)   . Nontoxic multinodular goiter   . History of esophagitis     Gastritis, duodenitis. EGD neg 10/20/02  . Hypercholesterolemia   . Arteriovenous malformation of duodenum   . Leg cramps     With mild claudication  . Diverticulosis of colon   . Hx of colonic polyps   . Gall stones 10/1997  . Goiter     thyroid US- bx 06/26/03  . Femoral neck fracture     ORIF, Dr. Latanya Maudlin 02/20/08  . Compression fracture of vertebra     T4/ kyphoplasty, hosp 12/31-06/02/10  . Carcinoma of colon  s/p resection  . Skin cancer     L forearm 2012, per derm    Past Surgical History  Procedure Laterality Date  . Abdominal hysterectomy    . Hip fracture surgery    . Colon resection  05/1997    cancer and carcinoid tumor  . Ileostomy  05/1997    takedown 7/99  . Angioplasty  7/00    R CA  . Abdominal surgery      incarcerated ventral hernia, MRSA 06/28/00  . Cataract extraction  2004    bilateral  . Total abdominal hysterectomy    . Cardiac catheterization      stent/ ER, ok LE's 10/00. PV airtigraogy- stent 10/00     Social History:  The patient  reports that she has quit smoking. Her smoking use included Cigarettes. She has never used smokeless tobacco. She reports that she does not drink alcohol or use illicit drugs.   Family History:  The patient's family history includes Cancer in her father; Kidney disease in her  mother.    ROS:  Please see the history of present illness. All other systems are reviewed and  Negative to the above problem except as noted.    PHYSICAL EXAM: VS:  BP 98/70 mmHg  Pulse 87  Ht 5\' 4"  (1.626 m)  Wt 125 lb (56.7 kg)  BMI 21.45 kg/m2  GEN: Well nourished, well developed, in no acute distress HEENT: normal Neck: no JVD, carotid bruits, or masses Cardiac:  Irreg; no murmurs, rubs, or gallops,no edema  Respiratory:  clear to auscultation bilaterally, normal work of breathing GI: soft, nontender, nondistended, + BS  No hepatomegaly  MS: no deformity Moving all extremities   Neuro:  Strength and sensation are intact Psych: euthymic mood, full affect   EKG:  EKG is not ordered today.   Lipid Panel    Component Value Date/Time   CHOL 163 07/31/2012 0907   TRIG 58 07/31/2012 0907   HDL 62 07/31/2012 0907   CHOLHDL 2.6 07/31/2012 0907   VLDL 12 07/31/2012 0907   LDLCALC 89 07/31/2012 0907   LDLDIRECT 111.3 06/29/2009 1207      Wt Readings from Last 3 Encounters:  07/27/14 125 lb (56.7 kg)  01/26/14 121 lb (54.885 kg)  07/29/13 123 lb (55.792 kg)      ASSESSMENT AND PLAN:  1.  Atrial fib  COntinue rate control  No anticoagulation    2.  CAD  No symptoms of angina  Continue meds  3.  COPD  Patinet remains on O2     Current medicines are reviewed at length with the patient today.  The patient does not have concerns regarding medicines.  The following changes have been made: No change  Labs/ tests ordered today include:  Labs today      Signed, Dorris Carnes, MD  07/27/2014 2:13 PM    [facility] [address], [place], [place]  [address] Phone: [phone]; Fax: [phone]

## 2014-07-27 NOTE — Patient Instructions (Signed)
Your physician recommends that you continue on your current medications as directed. Please refer to the Current Medication list given to you today. Your physician recommends that you return for lab work today (cbc, bmet, tsh, bnp, lipids)  Your physician wants you to follow-up in: oct/nov with Dr. Harrington Challenger.   You will receive a reminder letter in the mail two months in advance. If you don't receive a letter, please call our office to schedule the follow-up appointment.

## 2014-08-07 ENCOUNTER — Telehealth: Payer: Self-pay | Admitting: *Deleted

## 2014-08-07 DIAGNOSIS — I50810 Right heart failure, unspecified: Secondary | ICD-10-CM

## 2014-08-07 DIAGNOSIS — I10 Essential (primary) hypertension: Secondary | ICD-10-CM

## 2014-08-07 DIAGNOSIS — R609 Edema, unspecified: Secondary | ICD-10-CM

## 2014-08-07 NOTE — Telephone Encounter (Signed)
Notes Recorded by Rodman Key, RN on 08/07/2014 at 2:32 PM Orders placed for BMET and BNP for around 3/18. Staff message sent to scheduler to set up appointment with patient. Notes Recorded by Fay Records, MD on 08/01/2014 at 4:44 PM Check BMET, BNP in 2 wks to reconfirm fluid status, kidney function.

## 2014-08-14 ENCOUNTER — Other Ambulatory Visit: Payer: Medicare Other

## 2014-08-18 ENCOUNTER — Other Ambulatory Visit (INDEPENDENT_AMBULATORY_CARE_PROVIDER_SITE_OTHER): Payer: Medicare Other | Admitting: *Deleted

## 2014-08-18 DIAGNOSIS — I509 Heart failure, unspecified: Secondary | ICD-10-CM

## 2014-08-18 DIAGNOSIS — I10 Essential (primary) hypertension: Secondary | ICD-10-CM

## 2014-08-18 DIAGNOSIS — I50810 Right heart failure, unspecified: Secondary | ICD-10-CM

## 2014-08-18 DIAGNOSIS — R609 Edema, unspecified: Secondary | ICD-10-CM

## 2014-08-18 LAB — BASIC METABOLIC PANEL
BUN: 28 mg/dL — ABNORMAL HIGH (ref 6–23)
CHLORIDE: 98 meq/L (ref 96–112)
CO2: 38 meq/L — AB (ref 19–32)
CREATININE: 1.24 mg/dL — AB (ref 0.40–1.20)
Calcium: 9.7 mg/dL (ref 8.4–10.5)
GFR: 42.96 mL/min — ABNORMAL LOW (ref >60.00)
Glucose, Bld: 91 mg/dL (ref 70–99)
Potassium: 4.3 mEq/L (ref 3.5–5.1)
SODIUM: 140 meq/L (ref 135–145)

## 2014-08-18 LAB — BRAIN NATRIURETIC PEPTIDE: PRO B NATRI PEPTIDE: 63 pg/mL (ref 0.0–100.0)

## 2015-04-09 ENCOUNTER — Ambulatory Visit: Payer: Medicare Other | Admitting: Internal Medicine

## 2015-04-29 ENCOUNTER — Encounter: Payer: Self-pay | Admitting: Internal Medicine

## 2015-04-29 ENCOUNTER — Ambulatory Visit (INDEPENDENT_AMBULATORY_CARE_PROVIDER_SITE_OTHER): Payer: Medicare Other | Admitting: Internal Medicine

## 2015-04-29 VITALS — BP 118/60 | HR 96 | Ht 64.0 in | Wt 110.8 lb

## 2015-04-29 DIAGNOSIS — E785 Hyperlipidemia, unspecified: Secondary | ICD-10-CM

## 2015-04-29 DIAGNOSIS — I10 Essential (primary) hypertension: Secondary | ICD-10-CM | POA: Diagnosis not present

## 2015-04-29 DIAGNOSIS — I50810 Right heart failure, unspecified: Secondary | ICD-10-CM

## 2015-04-29 DIAGNOSIS — I509 Heart failure, unspecified: Secondary | ICD-10-CM

## 2015-04-29 LAB — LIPID PANEL
CHOL/HDL RATIO: 2.4 ratio (ref <=5.0)
Cholesterol: 176 mg/dL (ref 125–200)
HDL: 73 mg/dL (ref >=46)
LDL CALC: 87 mg/dL (ref <130)
TRIGLYCERIDES: 78 mg/dL (ref <150)
VLDL: 16 mg/dL (ref <30)

## 2015-04-29 LAB — BASIC METABOLIC PANEL
BUN: 27 mg/dL — AB (ref 7–25)
CHLORIDE: 96 mmol/L — AB (ref 98–110)
CO2: 35 mmol/L — AB (ref 20–31)
CREATININE: 1.24 mg/dL — AB (ref 0.60–0.88)
Calcium: 9.7 mg/dL (ref 8.6–10.4)
GLUCOSE: 96 mg/dL (ref 65–99)
Potassium: 4.1 mmol/L (ref 3.5–5.3)
Sodium: 141 mmol/L (ref 135–146)

## 2015-04-29 LAB — CBC
HCT: 40.6 % (ref 36.0–46.0)
Hemoglobin: 13.6 g/dL (ref 12.0–15.0)
MCH: 34.3 pg — AB (ref 26.0–34.0)
MCHC: 33.5 g/dL (ref 30.0–36.0)
MCV: 102.5 fL — AB (ref 78.0–100.0)
MPV: 11.4 fL (ref 8.6–12.4)
Platelets: 119 10*3/uL — ABNORMAL LOW (ref 150–400)
RBC: 3.96 MIL/uL (ref 3.87–5.11)
RDW: 13 % (ref 11.5–15.5)
WBC: 8.4 10*3/uL (ref 4.0–10.5)

## 2015-04-29 LAB — TSH: TSH: 0.452 u[IU]/mL (ref 0.350–4.500)

## 2015-04-29 NOTE — Progress Notes (Signed)
Cardiology Office Note   Date:  04/29/2015   ID:  Abigail [name], DOB 08-Jun-1919, MRN [id number]  PCP:  Vic Blackbird, MD  Cardiologist:   Dorris Carnes, MD   No chief complaint on file.  F/U of CAD and afib     History of Present Illness: Abigail [name] is a 81 y.o. female with a history ofCAD, prior PTCA to the right coronary artery, atrial fibrillation, no anticoagulation, , hypertension, and hyperlipidemia. I saw her in clinic in Feb 2016   SInce seen her breathing has been stable  Denies dizzienss  No CP      Current Outpatient Prescriptions  Medication Sig Dispense Refill  . aspirin 81 MG tablet Take 81 mg by mouth every morning.     Marland Kitchen atenolol (TENORMIN) 50 MG tablet Take 50 mg by mouth daily.     . Calcium 1500 MG tablet Take 1,500 mg by mouth every morning.     . cholecalciferol (VITAMIN D) 1000 UNITS tablet Take 1,000 Units by mouth daily.      Marland Kitchen CRANBERRY PO Take 1 tablet by mouth 2 (two) times daily.    . Cyanocobalamin (VITAMIN B 12 PO) Take 500 mcg by mouth daily.    . Fluticasone-Salmeterol (ADVAIR DISKUS) 250-50 MCG/DOSE AEPB Inhale 1 puff into the lungs 2 (two) times daily. 60 each 3  . GRAPE SEED PO Take 1 capsule by mouth 2 (two) times daily.     . Multiple Vitamins-Minerals (CENTRUM SILVER PO) Take 1 tablet by mouth daily.     . mupirocin ointment (BACTROBAN) 2 % Apply 1 application topically 2 (two) times daily.     . nitroGLYCERIN (NITROSTAT) 0.4 MG SL tablet Place one under tongue every 5 minutes x 3 as needed 25 tablet 1  . Omega-3 Fatty Acids (FISH OIL) 1000 MG CAPS Take 1 capsule by mouth 2 (two) times daily.     Marland Kitchen omeprazole (PRILOSEC) 20 MG capsule Take 20 mg by mouth every morning.     . potassium chloride (K-DUR) 10 MEQ tablet Take 1 tablet (10 mEq total) by mouth once. 30 tablet 3  . spironolactone (ALDACTONE) 25 MG tablet Take 25 mg by mouth daily.      No current facility-administered medications for this visit.    Allergies:   Toprol xl ;  Budesonide-formoterol fumarate; Sulfa antibiotics; Sulfonamide derivatives; Sulfur; Diltiazem; and Metoprolol   Past Medical History  Diagnosis Date  . Asthma   . Coronary atherosclerosis of native coronary artery     s/p PTCA RCA (Dr. Clayborn Bigness, [place])  . PVD (peripheral vascular disease) ([place])   . Atrial fibrillation ([place])   . Essential hypertension, benign   . Am[place]   . COPD (chronic obstructive pulmonary disease) ([place]) 2008    Emphysemaous with asthmatic component-FEV 2008 34%  . Rectal bleeding   . SBO (small bowel obstruction) ([place])   . MRSA (methicillin resistant Staphylococcus aureus)   . Nontoxic multinodular goiter   . History of esophagitis     Gastritis, duodenitis. EGD neg 10/20/02  . Hypercholesterolemia   . Arteriovenous malformation of duodenum   . Leg cramps     With mild claudication  . Diverticulosis of colon   . Hx of colonic polyps   . Gall stones 10/1997  . Goiter     thyroid US- bx 06/26/03  . Femoral neck fracture (HCC)     ORIF, Dr. Latanya Maudlin 02/20/08  . Compression fracture of vertebra ([place])  T4/ kyphoplasty, hosp 12/31-06/02/10  . Carcinoma of colon [facility])     s/p resection  . Skin cancer     L forearm 2012, per derm    Past Surgical History  Procedure Laterality Date  . Abdominal hysterectomy    . Hip fracture surgery    . Colon resection  05/1997    cancer and carcinoid tumor  . Ileostomy  05/1997    takedown 7/99  . Angioplasty  7/00    R CA  . Abdominal surgery      incarcerated ventral hernia, MRSA 06/28/00  . Cataract extraction  2004    bilateral  . Total abdominal hysterectomy    . Cardiac catheterization      stent/ ER, ok LE's 10/00. PV airtigraogy- stent 10/00     Social History:  The patient  reports that she has quit smoking. Her smoking use included Cigarettes. She has never used smokeless tobacco. She reports that she does not drink alcohol or use illicit drugs.   Family History:  The patient's family history includes  Cancer in her father; Kidney disease in her mother.    ROS:  Please see the history of present illness. All other systems are reviewed and  Negative to the above problem except as noted.    PHYSICAL EXAM: VS:  BP 118/60 mmHg  Pulse 96  Ht 5\' 4"  (1.626 m)  Wt 50.259 kg (110 lb 12.8 oz)  BMI 19.01 kg/m2  GEN: Well nourished, well developed, in no acute distress HEENT: normal Neck: no JVD, carotid bruits, or masses Cardiac:  Irreg irreg; no murmurs, rubs, or gallops,no edema  Respiratory:  clear to auscultation bilaterally, normal work of breathing GI: soft, nontender, nondistended, + BS  No hepatomegaly  MS: no deformity Moving all extremities   Skin: warm and dry, no rash Neuro:  Strength and sensation are intact Psych: euthymic mood, full affect   EKG:  EKG is ordered today.  Atrial fib  96 bpm  RBBB.     Lipid Panel    Component Value Date/Time   CHOL 189 07/27/2014 1443   TRIG 82.0 07/27/2014 1443   HDL 65.50 07/27/2014 1443   CHOLHDL 3 07/27/2014 1443   VLDL 16.4 07/27/2014 1443   LDLCALC 107* 07/27/2014 1443   LDLDIRECT 111.3 06/29/2009 1207      Wt Readings from Last 3 Encounters:  04/29/15 50.259 kg (110 lb 12.8 oz)  07/27/14 56.7 kg (125 lb)  01/26/14 54.885 kg (121 lb)      ASSESSMENT AND PLAN:  1  Atrial fib  Rates controlled  Pt asymptomatic  Not a coumadin candidate due to GI bleeding   2.  CAD No symptoms to sugg angina  3  COPD    WIll get CBC, BMET, lipids and TSH today F?U in 9 months      Signed, Dorris Carnes, MD  04/29/2015 8:37 AM    [address], [place], [place]  [address] Phone: [phone]; Fax: [phone]

## 2015-04-29 NOTE — Patient Instructions (Signed)
Your physician recommends that you continue on your current medications as directed. Please refer to the Current Medication list given to you today. Your physician recommends that you return for lab work today (CBC, BMET, [place], TSH)  Your physician wants you to follow-up in: [place].  You will receive a reminder letter in the mail two months in advance. If you don't receive a letter, please call our office to schedule the follow-up appointment.

## 2015-07-09 IMAGING — CR DG CHEST 2V
2 series · 2 of 2 positions shown · non-contrast
Comparison: June 09, 2013

CLINICAL DATA: Shortness of Breath

EXAM:
CHEST  2 VIEW

[view not recorded (1 of 2)]
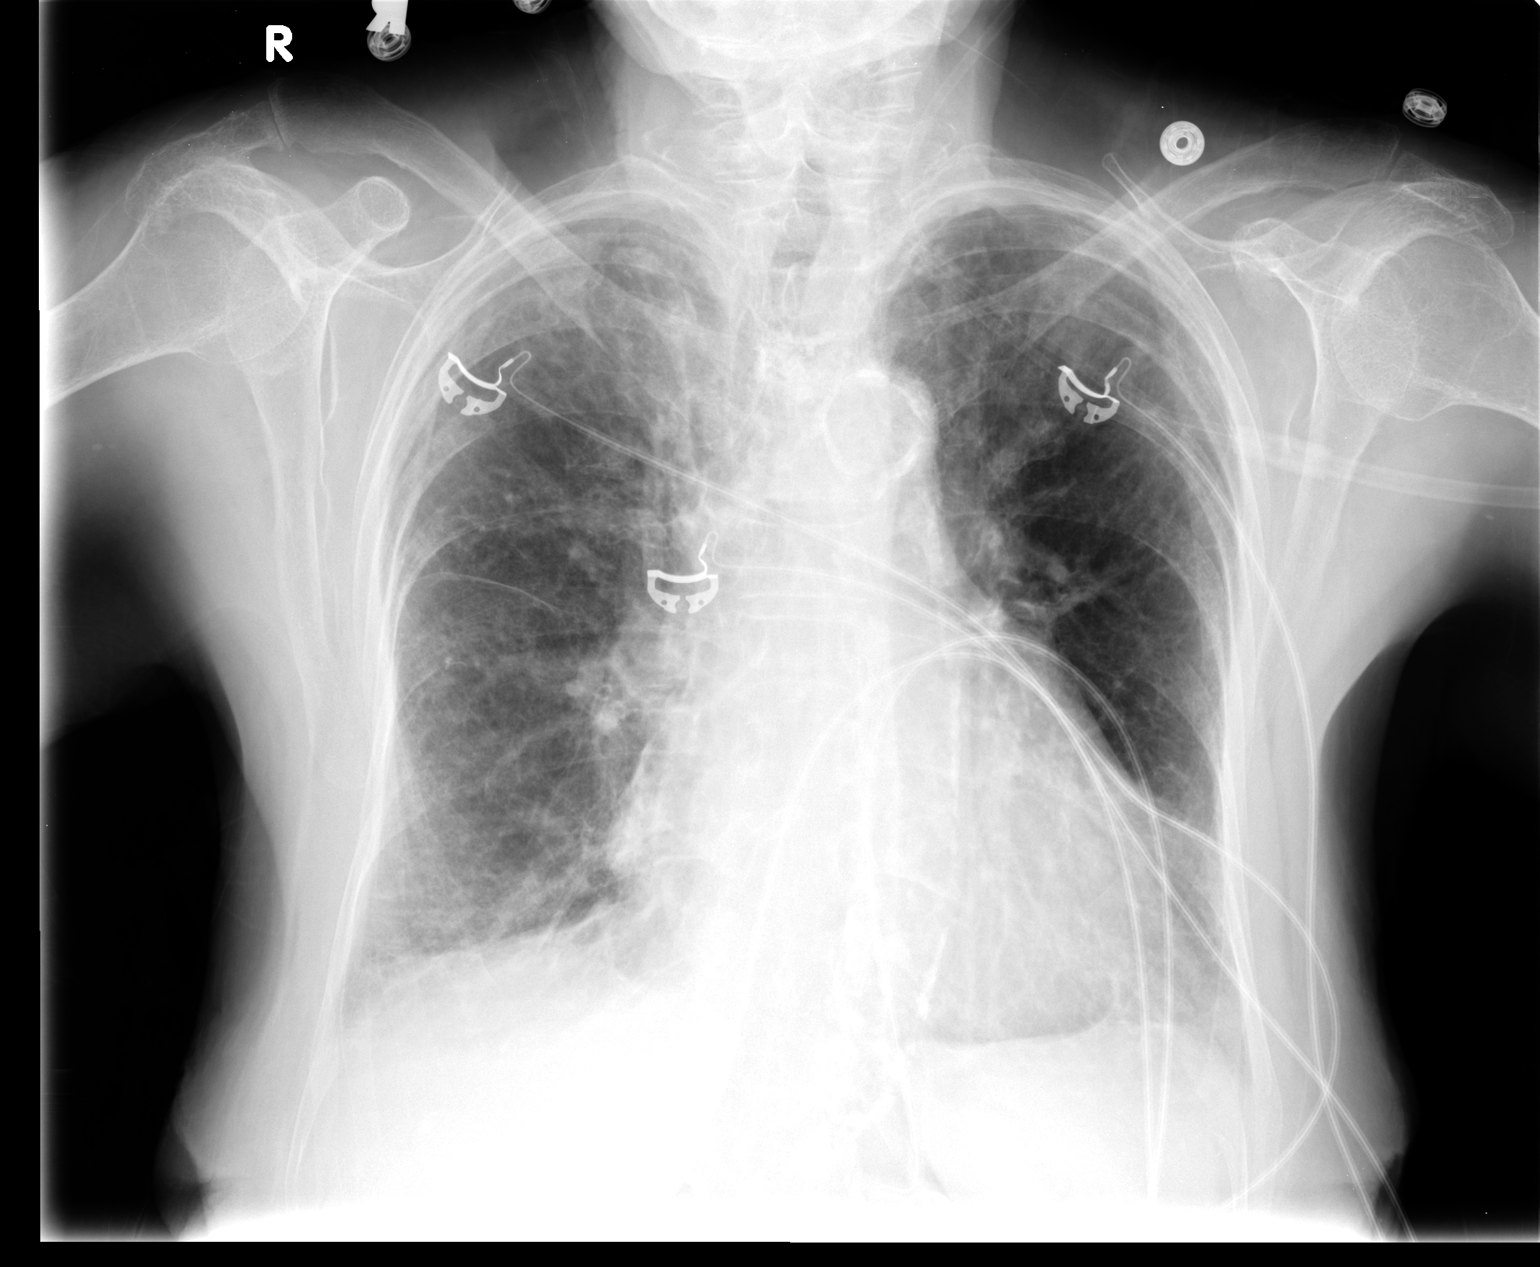

[view not recorded (2 of 2)]
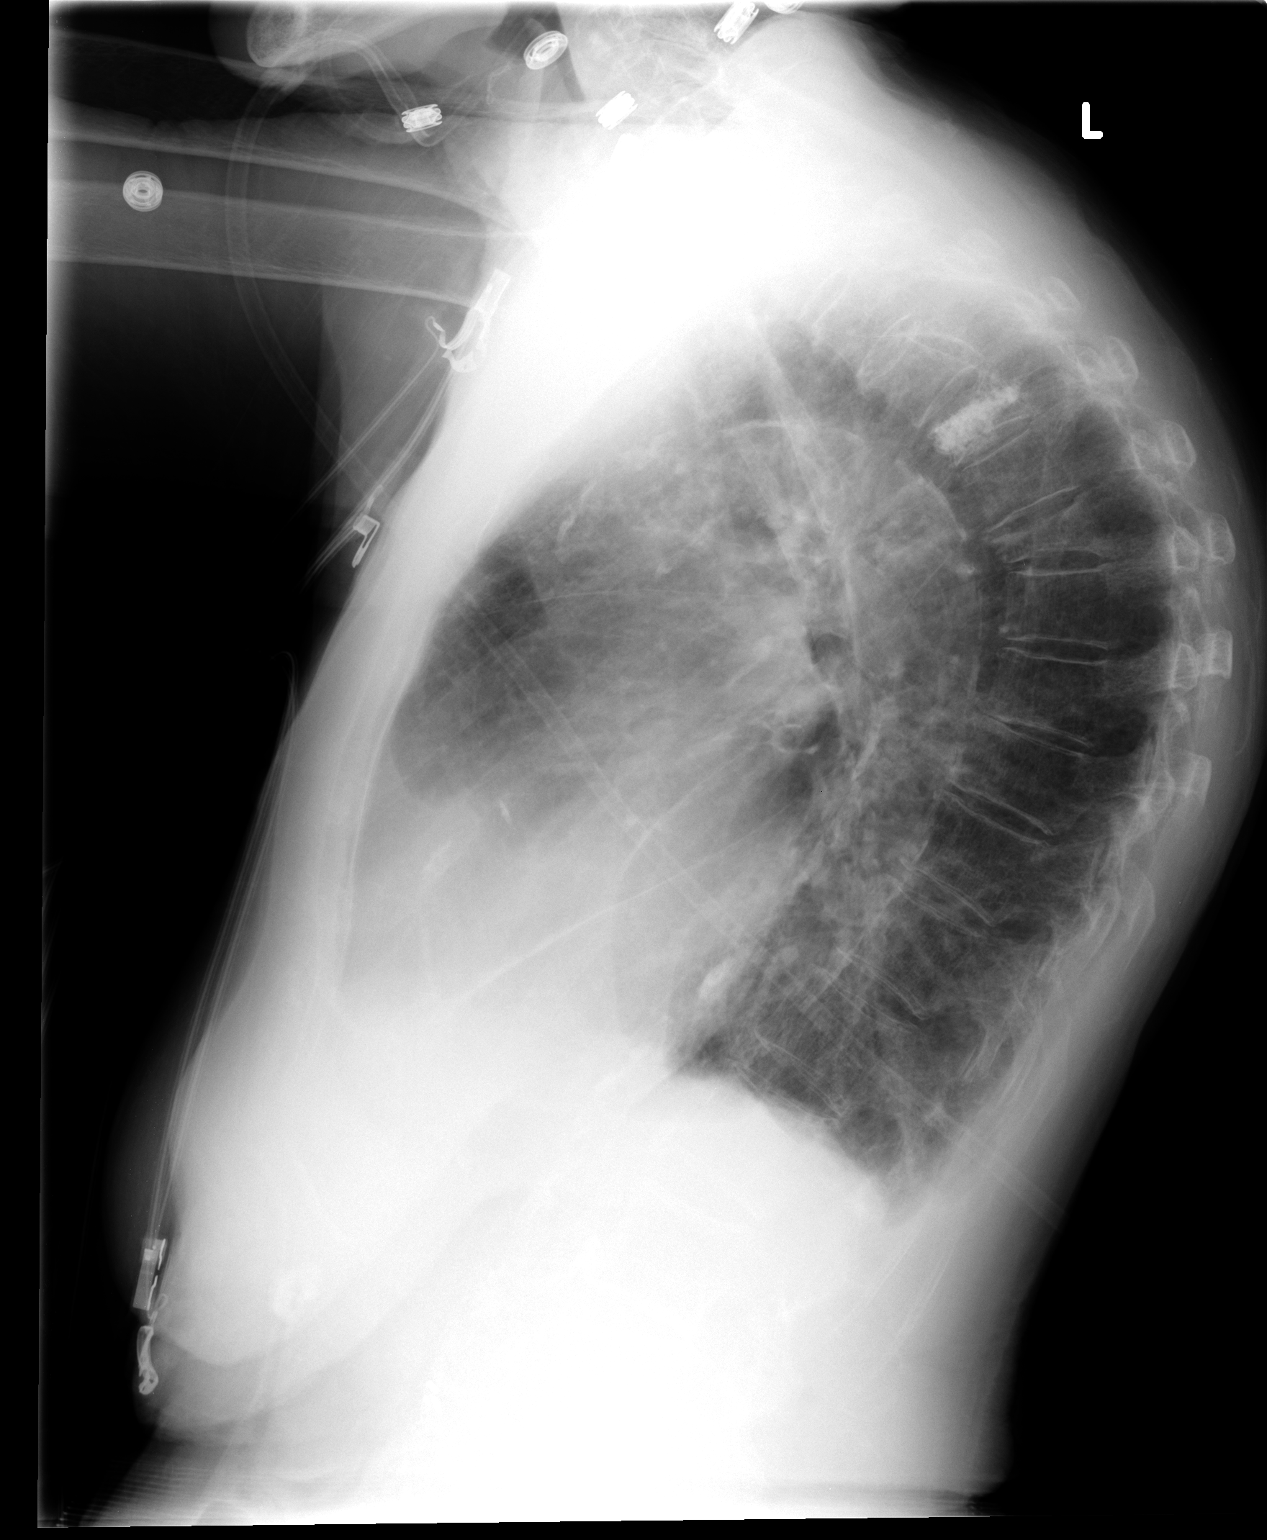

[2 of 2 positions shown; findings below may reference images not displayed]

FINDINGS: There is underlying emphysematous change. There is stable
interstitial prominence, prior chronic inflammatory type change. A
degree of chronic congestive heart failure, however, cannot be
excluded. There is no airspace consolidation.

Heart is mildly enlarged. The pulmonary vascularity is overall
within normal limits, possibly slightly prominent given underlying
emphysema. The appearance is stable.

There is extensive atherosclerotic change in the aorta. No
adenopathy. Patient is status post kyphoplasty at T4. Marked
collapse of the T6 vertebral body is stable. There is increase in
kyphosis in the mid thoracic region.
IMPRESSION: Stable chest. Underlying emphysema. Question chronic inflammatory
change versus a degree of chronic congestive heart failure
superimposed on emphysema. No airspace consolidation. Extensive
atherosclerotic change. Collapse of the T6 vertebral body with focal
kyphosis in the midthoracic region is stable.

## 2016-01-28 ENCOUNTER — Encounter: Payer: Self-pay | Admitting: Internal Medicine

## 2016-02-13 NOTE — Progress Notes (Signed)
Cardiology Office Note   Date:  02/14/2016   ID:  Abigail [name], DOB 1920/03/29, MRN [id number]  PCP:  Vic Blackbird, MD  Cardiologist:   Dorris Carnes, MD   F/U of CAD   History of Present Illness: Abigail [name] is a 82 y.o. female with a history of CAD (s/p PTCA of RCA), atrial fib (no anticoag), HTN, HL   Pt denies CP  Does have signif SOB  Followed in [place] (Pulmonary)  HAs appt next wk    Living in Assisted liviing in [place]     Outpatient Medications Prior to Visit  Medication Sig Dispense Refill  . aspirin 81 MG tablet Take 81 mg by mouth every morning.     Marland Kitchen atenolol (TENORMIN) 50 MG tablet Take 50 mg by mouth daily.     . Calcium 1500 MG tablet Take 1,500 mg by mouth every morning.     . cholecalciferol (VITAMIN D) 1000 UNITS tablet Take 1,000 Units by mouth daily.      Marland Kitchen CRANBERRY PO Take 1 tablet by mouth 2 (two) times daily.    . Cyanocobalamin (VITAMIN B 12 PO) Take 500 mcg by mouth daily.    . Fluticasone-Salmeterol (ADVAIR DISKUS) 250-50 MCG/DOSE AEPB Inhale 1 puff into the lungs 2 (two) times daily. 60 each 3  . GRAPE SEED PO Take 1 capsule by mouth 2 (two) times daily.     . Multiple Vitamins-Minerals (CENTRUM SILVER PO) Take 1 tablet by mouth daily.     . mupirocin ointment (BACTROBAN) 2 % Apply 1 application topically 2 (two) times daily.     . nitroGLYCERIN (NITROSTAT) 0.4 MG SL tablet Place one under tongue every 5 minutes x 3 as needed 25 tablet 1  . Omega-3 Fatty Acids (FISH OIL) 1000 MG CAPS Take 1 capsule by mouth 2 (two) times daily.     Marland Kitchen omeprazole (PRILOSEC) 20 MG capsule Take 20 mg by mouth every morning.     . potassium chloride (K-DUR) 10 MEQ tablet Take 1 tablet (10 mEq total) by mouth once. 30 tablet 3  . spironolactone (ALDACTONE) 25 MG tablet Take 25 mg by mouth daily.      No facility-administered medications prior to visit.      Allergies:   Budesonide-formoterol fumarate; Metoprolol; Other; Sulfur; Toprol xl  [metoprolol succinate  er]; Sulfa antibiotics; Sulfonamide derivatives; and Diltiazem   Past Medical History:  Diagnosis Date  . Amenia   . Arteriovenous malformation of duodenum   . Asthma   . Atrial fibrillation ([place])   . Carcinoma of colon [facility])    s/p resection  . Compression fracture of vertebra ([place])    T4/ kyphoplasty, hosp 12/31-06/02/10  . COPD (chronic obstructive pulmonary disease) ([place]) 2008   Emphysemaous with asthmatic component-FEV 2008 34%  . Coronary atherosclerosis of native coronary artery    s/p PTCA RCA (Dr. Clayborn Bigness, [place])  . Diverticulosis of colon   . Essential hypertension, benign   . Femoral neck fracture (HCC)    ORIF, Dr. Latanya Maudlin 02/20/08  . Gall stones 10/1997  . Goiter    thyroid US- bx 06/26/03  . History of esophagitis    Gastritis, duodenitis. EGD neg 10/20/02  . Hx of colonic polyps   . Hypercholesterolemia   . Leg cramps    With mild claudication  . MRSA (methicillin resistant Staphylococcus aureus)   . Nontoxic multinodular goiter   . PVD (peripheral vascular disease) ([place])   . Rectal bleeding   .  SBO (small bowel obstruction) ([place])   . Skin cancer    L forearm 2012, per derm    Past Surgical History:  Procedure Laterality Date  . ABDOMINAL HYSTERECTOMY    . ABDOMINAL SURGERY     incarcerated ventral hernia, MRSA 06/28/00  . ANGIOPLASTY  7/00   R CA  . CARDIAC CATHETERIZATION     stent/ ER, ok LE's 10/00. PV airtigraogy- stent 10/00  . CATARACT EXTRACTION  2004   bilateral  . COLON RESECTION  05/1997   cancer and carcinoid tumor  . HIP FRACTURE SURGERY    . ILEOSTOMY  05/1997   takedown 7/99  . TOTAL ABDOMINAL HYSTERECTOMY       Social History:  The patient  reports that she has quit smoking. Her smoking use included Cigarettes. She has never used smokeless tobacco. She reports that she does not drink alcohol or use drugs.   Family History:  The patient's family history includes Cancer in her father; Kidney disease in her mother.    ROS:   Please see the history of present illness. All other systems are reviewed and  Negative to the above problem except as noted.    PHYSICAL EXAM: VS:  BP 124/68   Pulse 91   Ht 5\' 4"  (1.626 m)   Wt 112 lb (50.8 kg)   SpO2 98%   BMI 19.22 kg/m   GEN: thin 82 yo, in no acute distress  HEENT: normal Neck: no JVD, carotid bruits, or masses Cardiac: RRR; no murmurs, rubs, or gallops,  Tr edema  Respiratory: Decreased airflow bilaterally GI: soft, nontender, nondistended, + BS  No hepatomegaly  MS: no deformity Moving all extremities   Skin: warm and dry, no rash Neuro:  Strength and sensation are intact Psych: euthymic mood, full affect   EKG:  EKG is not  ordered today.   Lipid Panel    Component Value Date/Time   CHOL 176 04/29/2015 0901   TRIG 78 04/29/2015 0901   HDL 73 04/29/2015 0901   CHOLHDL 2.4 04/29/2015 0901   VLDL 16 04/29/2015 0901   LDLCALC 87 04/29/2015 0901   LDLDIRECT 111.3 06/29/2009 1207      Wt Readings from Last 3 Encounters:  02/14/16 112 lb (50.8 kg)  04/29/15 110 lb 12.8 oz (50.3 kg)  07/27/14 125 lb (56.7 kg)      ASSESSMENT AND PLAN:  1  Dysponea  Pt not moving air well  I have given her my card to have pulmonologist contact me when he sees her  CHeck CBC, BMET and BNP today   Echo a few years ago LVEF was normal  2.  CAD I am not convinced of active angina   3.  Pulmonary  On O2    4  HL  Check lipids      Current medicines are reviewed at length with the patient today.  The patient does not have concerns regarding medicines.  Disposition:   FU with  in   Signed, Dorris Carnes, MD  02/14/2016 2:13 PM    [facility] [place], [place], [place]  [address] Phone: [phone]; Fax: [phone]

## 2016-02-14 ENCOUNTER — Encounter (INDEPENDENT_AMBULATORY_CARE_PROVIDER_SITE_OTHER): Payer: Self-pay

## 2016-02-14 ENCOUNTER — Ambulatory Visit (INDEPENDENT_AMBULATORY_CARE_PROVIDER_SITE_OTHER): Payer: Medicare Other | Admitting: Internal Medicine

## 2016-02-14 VITALS — BP 124/68 | HR 91 | Ht 64.0 in | Wt 112.0 lb

## 2016-02-14 DIAGNOSIS — I2510 Atherosclerotic heart disease of native coronary artery without angina pectoris: Secondary | ICD-10-CM | POA: Diagnosis not present

## 2016-02-14 DIAGNOSIS — I509 Heart failure, unspecified: Secondary | ICD-10-CM | POA: Diagnosis not present

## 2016-02-14 DIAGNOSIS — I10 Essential (primary) hypertension: Secondary | ICD-10-CM

## 2016-02-14 DIAGNOSIS — I4891 Unspecified atrial fibrillation: Secondary | ICD-10-CM | POA: Diagnosis not present

## 2016-02-14 DIAGNOSIS — I50810 Right heart failure, unspecified: Secondary | ICD-10-CM

## 2016-02-14 LAB — CBC
HEMATOCRIT: 40.1 % (ref 35.0–45.0)
Hemoglobin: 13.1 g/dL (ref 11.7–15.5)
MCH: 34 pg — ABNORMAL HIGH (ref 27.0–33.0)
MCHC: 32.7 g/dL (ref 32.0–36.0)
MCV: 104.2 fL — AB (ref 80.0–100.0)
MPV: 11.5 fL (ref 7.5–12.5)
Platelets: 109 10*3/uL — ABNORMAL LOW (ref 140–400)
RBC: 3.85 MIL/uL (ref 3.80–5.10)
RDW: 13.7 % (ref 11.0–15.0)
WBC: 7.3 10*3/uL (ref 3.8–10.8)

## 2016-02-14 NOTE — Patient Instructions (Signed)
Your physician recommends that you continue on your current medications as directed. Please refer to the Current Medication list given to you today. Your physician recommends that you return for lab work today (BMET, CBC, BNP, LIPIDS) Your physician wants you to follow-up in: [place].  You will receive a reminder letter in the mail two months in advance. If you don't receive a letter, please call our office to schedule the follow-up appointment.

## 2016-02-15 LAB — BASIC METABOLIC PANEL
BUN: 32 mg/dL — ABNORMAL HIGH (ref 7–25)
CALCIUM: 9.6 mg/dL (ref 8.6–10.4)
CO2: 36 mmol/L — AB (ref 20–31)
CREATININE: 1.24 mg/dL — AB (ref 0.60–0.88)
Chloride: 97 mmol/L — ABNORMAL LOW (ref 98–110)
GLUCOSE: 78 mg/dL (ref 65–99)
Potassium: 4.3 mmol/L (ref 3.5–5.3)
Sodium: 141 mmol/L (ref 135–146)

## 2016-02-15 LAB — LIPID PANEL
Cholesterol: 185 mg/dL (ref 125–200)
HDL: 83 mg/dL
LDL Cholesterol: 88 mg/dL
Total CHOL/HDL Ratio: 2.2 ratio
Triglycerides: 69 mg/dL
VLDL: 14 mg/dL

## 2016-02-15 LAB — BRAIN NATRIURETIC PEPTIDE: Brain Natriuretic Peptide: 107.9 pg/mL — ABNORMAL HIGH

## 2016-11-06 ENCOUNTER — Ambulatory Visit: Payer: Medicare Other | Admitting: Internal Medicine

## 2017-01-17 NOTE — Progress Notes (Signed)
Cardiology Office Note   Date:  01/18/2017   ID:  Abigail [name], DOB 11-May-1920, MRN [id number]  PCP:  Abigail Rossetti, MD  Cardiologist:   Abigail Carnes, MD   F/U of CAD   History of Present Illness: Abigail [name] is a 83 y.o. female with a history of CAD (s/p PTCA of RCA), atrial fib (no anticoag), HTN, HL  The pt was last seen in clinic in Fall 2017  Pt deneis CP  Breathig is OK on O2  Has been on this for 1 yr  Recently fell in bathroom  Hit head  Said floor was wet near toilet.  Outpatient Medications Prior to Visit  Medication Sig Dispense Refill  . aspirin 81 MG tablet Take 81 mg by mouth every morning.     Marland Kitchen atenolol (TENORMIN) 50 MG tablet Take 50 mg by mouth daily.     . Calcium 1500 MG tablet Take 1,500 mg by mouth every morning.     . cholecalciferol (VITAMIN D) 1000 UNITS tablet Take 1,000 Units by mouth daily.      Marland Kitchen CRANBERRY PO Take 1 tablet by mouth 2 (two) times daily.    . Cyanocobalamin (VITAMIN B 12 PO) Take 500 mcg by mouth daily.    . Fluticasone-Salmeterol (ADVAIR DISKUS) 250-50 MCG/DOSE AEPB Inhale 1 puff into the lungs 2 (two) times daily. 60 each 3  . GRAPE SEED PO Take 1 capsule by mouth 2 (two) times daily.     . Multiple Vitamins-Minerals (CENTRUM SILVER PO) Take 1 tablet by mouth daily.     . mupirocin ointment (BACTROBAN) 2 % Apply 1 application topically 2 (two) times daily.     . nitroGLYCERIN (NITROSTAT) 0.4 MG SL tablet Place one under tongue every 5 minutes x 3 as needed 25 tablet 1  . Omega-3 Fatty Acids (FISH OIL) 1000 MG CAPS Take 1 capsule by mouth 2 (two) times daily.     Marland Kitchen omeprazole (PRILOSEC) 20 MG capsule Take 20 mg by mouth every morning.     . potassium chloride (K-DUR) 10 MEQ tablet Take 1 tablet (10 mEq total) by mouth once. 30 tablet 3  . spironolactone (ALDACTONE) 25 MG tablet Take 25 mg by mouth daily.      No facility-administered medications prior to visit.      Allergies:   Budesonide-formoterol fumarate; Metoprolol;  Other; Sulfur; Toprol xl  [metoprolol tartrate]; Sulfa antibiotics; Sulfonamide derivatives; and Diltiazem   Past Medical History:  Diagnosis Date  . Amenia   . Arteriovenous malformation of duodenum   . Asthma   . Atrial fibrillation ([place])   . Carcinoma of colon [facility])    s/p resection  . Compression fracture of vertebra ([place])    T4/ kyphoplasty, hosp 12/31-06/02/10  . COPD (chronic obstructive pulmonary disease) ([place]) 2008   Emphysemaous with asthmatic component-FEV 2008 34%  . Coronary atherosclerosis of native coronary artery    s/p PTCA RCA (Dr. Clayborn Bigness, [place])  . Diverticulosis of colon   . Essential hypertension, benign   . Femoral neck fracture (HCC)    ORIF, Dr. Latanya Maudlin 02/20/08  . Gall stones 10/1997  . Goiter    thyroid US- bx 06/26/03  . History of esophagitis    Gastritis, duodenitis. EGD neg 10/20/02  . Hx of colonic polyps   . Hypercholesterolemia   . Leg cramps    With mild claudication  . MRSA (methicillin resistant Staphylococcus aureus)   . Nontoxic multinodular goiter   . PVD (  peripheral vascular disease) ([place])   . Rectal bleeding   . SBO (small bowel obstruction) ([place])   . Skin cancer    L forearm 2012, per derm    Past Surgical History:  Procedure Laterality Date  . ABDOMINAL HYSTERECTOMY    . ABDOMINAL SURGERY     incarcerated ventral hernia, MRSA 06/28/00  . ANGIOPLASTY  7/00   R CA  . CARDIAC CATHETERIZATION     stent/ ER, ok LE's 10/00. PV airtigraogy- stent 10/00  . CATARACT EXTRACTION  2004   bilateral  . COLON RESECTION  05/1997   cancer and carcinoid tumor  . HIP FRACTURE SURGERY    . ILEOSTOMY  05/1997   takedown 7/99  . TOTAL ABDOMINAL HYSTERECTOMY       Social History:  The patient  reports that she has quit smoking. Her smoking use included Cigarettes. She has never used smokeless tobacco. She reports that she does not drink alcohol or use drugs.   Family History:  The patient's family history includes Cancer in her father;  Kidney disease in her mother.    ROS:  Please see the history of present illness. All other systems are reviewed and  Negative to the above problem except as noted.    PHYSICAL EXAM: VS:  BP (!) 102/58   Pulse (!) 101   Ht 5\' 4"  (1.626 m)   Wt 108 lb 3.2 oz (49.1 kg)   BMI 18.57 kg/m   GEN: thin 83 yo, in no acute distress  HEENT: Bruise over R eye   Neck: JVP is increased , carotid bruits, or masses Cardiac: RRR; no murmurs, rubs, or gallops,  Tr edema Legs wrapped  Respiratory: Decreased airflow bilaterally GI: soft, nontender, nondistended, + BS  No hepatomegaly  MS: no deformity Moving all extremities   Skin: warm and dry, no rash Neuro:  Strength and sensation are intact Psych: euthymic mood, full affect   EKG:  EKG is  ordered today.  Atrial fib  101 bpm  RBBB  ST T wae chan   Lipid Panel    Component Value Date/Time   CHOL 185 02/14/2016 1428   TRIG 69 02/14/2016 1428   HDL 83 02/14/2016 1428   CHOLHDL 2.2 02/14/2016 1428   VLDL 14 02/14/2016 1428   LDLCALC 88 02/14/2016 1428   LDLDIRECT 111.3 06/29/2009 1207      Wt Readings from Last 3 Encounters:  01/18/17 108 lb 3.2 oz (49.1 kg)  02/14/16 112 lb (50.8 kg)  04/29/15 110 lb 12.8 oz (50.3 kg)      ASSESSMENT AND PLAN:  1  Dyspnea  Moving air No wheezes Volume appears to be up  Will check labs today  Consider intermitt lasix   2.  CAD I am not convinced of active angina   3.  Pulmonary  On O2  Sats good  4  HL  Check lipids     5  Atrial fib  COntinue rate control  Not on coumadin  Falls     Current medicines are reviewed at length with the patient today.  The patient does not have concerns regarding medicines.  Disposition:   FU with me in December    Signed, Abigail Carnes, MD  01/18/2017 11:34 AM    [facility] [place], [place], [place]  [address] Phone: [phone]; Fax: [phone]

## 2017-01-18 ENCOUNTER — Encounter (INDEPENDENT_AMBULATORY_CARE_PROVIDER_SITE_OTHER): Payer: Self-pay

## 2017-01-18 ENCOUNTER — Ambulatory Visit (INDEPENDENT_AMBULATORY_CARE_PROVIDER_SITE_OTHER): Payer: Medicare Other | Admitting: Internal Medicine

## 2017-01-18 ENCOUNTER — Encounter: Payer: Self-pay | Admitting: Internal Medicine

## 2017-01-18 DIAGNOSIS — I2510 Atherosclerotic heart disease of native coronary artery without angina pectoris: Secondary | ICD-10-CM

## 2017-01-18 DIAGNOSIS — R0600 Dyspnea, unspecified: Secondary | ICD-10-CM

## 2017-01-18 DIAGNOSIS — I4891 Unspecified atrial fibrillation: Secondary | ICD-10-CM | POA: Diagnosis not present

## 2017-01-18 DIAGNOSIS — R609 Edema, unspecified: Secondary | ICD-10-CM | POA: Diagnosis not present

## 2017-01-18 DIAGNOSIS — I50810 Right heart failure, unspecified: Secondary | ICD-10-CM | POA: Diagnosis not present

## 2017-01-18 NOTE — Patient Instructions (Signed)
Your physician recommends that you continue on your current medications as directed. Please refer to the Current Medication list given to you today.  Your physician recommends that you return for lab work today (CBC, BMET, TSH, BNP)  Your physician recommends that you schedule a follow-up appointment in: December, 2018 with Dr. Harrington Challenger.

## 2017-01-19 LAB — CBC
HEMATOCRIT: 35.2 % (ref 34.0–46.6)
Hemoglobin: 11.3 g/dL (ref 11.1–15.9)
MCH: 34.1 pg — ABNORMAL HIGH (ref 26.6–33.0)
MCHC: 32.1 g/dL (ref 31.5–35.7)
MCV: 106 fL — AB (ref 79–97)
PLATELETS: 92 10*3/uL — AB (ref 150–379)
RBC: 3.31 x10E6/uL — AB (ref 3.77–5.28)
RDW: 14.9 % (ref 12.3–15.4)
WBC: 4.4 10*3/uL (ref 3.4–10.8)

## 2017-01-19 LAB — BASIC METABOLIC PANEL
BUN/Creatinine Ratio: 21 (ref 12–28)
BUN: 38 mg/dL — AB (ref 10–36)
CHLORIDE: 95 mmol/L — AB (ref 96–106)
CO2: 29 mmol/L (ref 20–29)
Calcium: 9.2 mg/dL (ref 8.7–10.3)
Creatinine, Ser: 1.81 mg/dL — ABNORMAL HIGH (ref 0.57–1.00)
GFR, EST AFRICAN AMERICAN: 27 mL/min/{1.73_m2} — AB (ref >59)
GFR, EST NON AFRICAN AMERICAN: 24 mL/min/{1.73_m2} — AB (ref >59)
Glucose: 139 mg/dL — ABNORMAL HIGH (ref 65–99)
Potassium: 4.3 mmol/L (ref 3.5–5.2)
Sodium: 141 mmol/L (ref 134–144)

## 2017-01-19 LAB — TSH: TSH: 0.097 u[IU]/mL — ABNORMAL LOW (ref 0.450–4.500)

## 2017-01-19 LAB — PRO B NATRIURETIC PEPTIDE: NT-Pro BNP: 2223 pg/mL — ABNORMAL HIGH (ref 0–738)

## 2017-01-23 ENCOUNTER — Telehealth: Payer: Self-pay | Admitting: Internal Medicine

## 2017-01-23 DIAGNOSIS — R7989 Other specified abnormal findings of blood chemistry: Secondary | ICD-10-CM

## 2017-01-23 DIAGNOSIS — I4891 Unspecified atrial fibrillation: Secondary | ICD-10-CM

## 2017-01-23 DIAGNOSIS — I2510 Atherosclerotic heart disease of native coronary artery without angina pectoris: Secondary | ICD-10-CM

## 2017-01-23 NOTE — Telephone Encounter (Signed)
Notes recorded by Fay Records, MD on 01/19/2017 at 2:13 PM EDT Thyroid function is abnormal Needs free T3, free T4 Appears hyperthyorid FLuid is up I would repeat CBC (platelets low) and BMET Kdney function is down some from previous

## 2017-01-23 NOTE — Telephone Encounter (Signed)
New message     Pt son wants a call back this regarding call from [facility] about test results

## 2017-01-23 NOTE — Telephone Encounter (Signed)
Abigail [name] is aware pt needs repeat CBCd/BMET/free T3/free T4. Abigail [name] states he will be unable to bring his mother this week, lab appt has been scheduled for 01/30/17 at [facility] request.

## 2017-01-23 NOTE — Telephone Encounter (Signed)
I spoke with pt's son, Mortimer Fries.

## 2017-01-30 ENCOUNTER — Other Ambulatory Visit: Payer: Medicare Other | Admitting: *Deleted

## 2017-01-30 DIAGNOSIS — R7989 Other specified abnormal findings of blood chemistry: Secondary | ICD-10-CM

## 2017-01-31 LAB — CBC WITH DIFFERENTIAL/PLATELET
Basophils Absolute: 0 10*3/uL (ref 0.0–0.2)
Basos: 0 %
EOS (ABSOLUTE): 0.1 10*3/uL (ref 0.0–0.4)
EOS: 1 %
HEMATOCRIT: 37.7 % (ref 34.0–46.6)
HEMOGLOBIN: 11.9 g/dL (ref 11.1–15.9)
Immature Grans (Abs): 0 10*3/uL (ref 0.0–0.1)
Immature Granulocytes: 0 %
LYMPHS ABS: 1 10*3/uL (ref 0.7–3.1)
Lymphs: 23 %
MCH: 33.3 pg — ABNORMAL HIGH (ref 26.6–33.0)
MCHC: 31.6 g/dL (ref 31.5–35.7)
MCV: 106 fL — AB (ref 79–97)
MONOCYTES: 10 %
Monocytes Absolute: 0.4 10*3/uL (ref 0.1–0.9)
NEUTROS ABS: 2.7 10*3/uL (ref 1.4–7.0)
Neutrophils: 66 %
Platelets: 87 10*3/uL — CL (ref 150–379)
RBC: 3.57 x10E6/uL — AB (ref 3.77–5.28)
RDW: 15.2 % (ref 12.3–15.4)
WBC: 4.2 10*3/uL (ref 3.4–10.8)

## 2017-01-31 LAB — BASIC METABOLIC PANEL
BUN/Creatinine Ratio: 27 (ref 12–28)
BUN: 36 mg/dL (ref 10–36)
CO2: 31 mmol/L — ABNORMAL HIGH (ref 20–29)
CREATININE: 1.34 mg/dL — AB (ref 0.57–1.00)
Calcium: 9.7 mg/dL (ref 8.7–10.3)
Chloride: 97 mmol/L (ref 96–106)
GFR calc non Af Amer: 34 mL/min/{1.73_m2} — ABNORMAL LOW (ref >59)
GFR, EST AFRICAN AMERICAN: 39 mL/min/{1.73_m2} — AB (ref >59)
GLUCOSE: 93 mg/dL (ref 65–99)
Potassium: 4.4 mmol/L (ref 3.5–5.2)
SODIUM: 146 mmol/L — AB (ref 134–144)

## 2017-01-31 LAB — T4, FREE: Free T4: 1.61 ng/dL (ref 0.82–1.77)

## 2017-01-31 LAB — T3, FREE: T3 FREE: 3 pg/mL (ref 2.0–4.4)

## 2017-02-02 ENCOUNTER — Telehealth: Payer: Self-pay | Admitting: Internal Medicine

## 2017-02-02 NOTE — Telephone Encounter (Signed)
Follow up     Pt son is calling asking for a call back about labs. He said he has to leave in 30 minutes to get to [place] and he will be at the [place] all day with appts. He is asking for a call back.

## 2017-02-02 NOTE — Telephone Encounter (Signed)
Reviewed lab results with patient's son.

## 2017-02-02 NOTE — Telephone Encounter (Signed)
New message ° ° ° ° °Returning a call to the nurse to get lab results °

## 2017-02-02 NOTE — Telephone Encounter (Signed)
Medication list dropped off by patient's son.  At the care facility where pt lives, she is given tylenol 1000 mg twice daily and lasix 20 mg daily.  Medication list updated.

## 2017-02-06 ENCOUNTER — Telehealth: Payer: Self-pay | Admitting: Internal Medicine

## 2017-02-06 NOTE — Telephone Encounter (Signed)
°  New Message   pt son verbalized that he is returning call for rn

## 2017-02-06 NOTE — Telephone Encounter (Signed)
Patient's son reports the care facility doctor had increased atenolol to 50 mg AM and 25 mg PM.  This had been done prior to her visit with Dr. Harrington Challenger.  He thinks they made the change for blood pressure.  medication list updated.

## 2017-03-29 DEATH — deceased

## 2017-05-07 ENCOUNTER — Ambulatory Visit: Payer: Medicare Other | Admitting: Internal Medicine
